# Patient Record
Sex: Male | Born: 1980 | Race: White | Hispanic: No | Marital: Married | Smoking: Current every day smoker
Health system: Southern US, Community
[De-identification: ages and names within clinical notes are randomized; demographics above are authoritative.]

## PROBLEM LIST (undated history)

## (undated) DIAGNOSIS — M5136 Other intervertebral disc degeneration, lumbar region: Secondary | ICD-10-CM

## (undated) DIAGNOSIS — N200 Calculus of kidney: Secondary | ICD-10-CM

## (undated) DIAGNOSIS — M51369 Other intervertebral disc degeneration, lumbar region without mention of lumbar back pain or lower extremity pain: Secondary | ICD-10-CM

## (undated) DIAGNOSIS — J45909 Unspecified asthma, uncomplicated: Secondary | ICD-10-CM

## (undated) DIAGNOSIS — M549 Dorsalgia, unspecified: Secondary | ICD-10-CM

## (undated) DIAGNOSIS — I82409 Acute embolism and thrombosis of unspecified deep veins of unspecified lower extremity: Secondary | ICD-10-CM

## (undated) DIAGNOSIS — F419 Anxiety disorder, unspecified: Secondary | ICD-10-CM

## (undated) DIAGNOSIS — G4733 Obstructive sleep apnea (adult) (pediatric): Secondary | ICD-10-CM

## (undated) DIAGNOSIS — K219 Gastro-esophageal reflux disease without esophagitis: Secondary | ICD-10-CM

## (undated) DIAGNOSIS — E7800 Pure hypercholesterolemia, unspecified: Secondary | ICD-10-CM

## (undated) HISTORY — PX: LITHOTRIPSY: SUR834

---

## 2008-04-16 ENCOUNTER — Emergency Department (HOSPITAL_BASED_OUTPATIENT_CLINIC_OR_DEPARTMENT_OTHER): Admission: EM | Admit: 2008-04-16 | Discharge: 2008-04-16 | Payer: Self-pay | Admitting: Emergency Medicine

## 2008-06-07 ENCOUNTER — Emergency Department (HOSPITAL_BASED_OUTPATIENT_CLINIC_OR_DEPARTMENT_OTHER): Admission: EM | Admit: 2008-06-07 | Discharge: 2008-06-07 | Payer: Self-pay | Admitting: Emergency Medicine

## 2008-07-01 ENCOUNTER — Emergency Department (HOSPITAL_BASED_OUTPATIENT_CLINIC_OR_DEPARTMENT_OTHER): Admission: EM | Admit: 2008-07-01 | Discharge: 2008-07-01 | Payer: Self-pay | Admitting: Emergency Medicine

## 2008-07-22 ENCOUNTER — Emergency Department (HOSPITAL_BASED_OUTPATIENT_CLINIC_OR_DEPARTMENT_OTHER): Admission: EM | Admit: 2008-07-22 | Discharge: 2008-07-22 | Payer: Self-pay | Admitting: Emergency Medicine

## 2008-07-25 ENCOUNTER — Emergency Department (HOSPITAL_BASED_OUTPATIENT_CLINIC_OR_DEPARTMENT_OTHER): Admission: EM | Admit: 2008-07-25 | Discharge: 2008-07-25 | Payer: Self-pay | Admitting: Emergency Medicine

## 2009-02-10 ENCOUNTER — Emergency Department (HOSPITAL_BASED_OUTPATIENT_CLINIC_OR_DEPARTMENT_OTHER): Admission: EM | Admit: 2009-02-10 | Discharge: 2009-02-11 | Payer: Self-pay | Admitting: Emergency Medicine

## 2009-02-10 ENCOUNTER — Ambulatory Visit: Payer: Self-pay | Admitting: Diagnostic Radiology

## 2010-01-08 ENCOUNTER — Emergency Department (HOSPITAL_BASED_OUTPATIENT_CLINIC_OR_DEPARTMENT_OTHER)
Admission: EM | Admit: 2010-01-08 | Discharge: 2010-01-08 | Payer: Self-pay | Source: Home / Self Care | Admitting: Emergency Medicine

## 2010-01-12 ENCOUNTER — Emergency Department (HOSPITAL_BASED_OUTPATIENT_CLINIC_OR_DEPARTMENT_OTHER)
Admission: EM | Admit: 2010-01-12 | Discharge: 2010-01-12 | Payer: Self-pay | Source: Home / Self Care | Admitting: Emergency Medicine

## 2010-04-13 LAB — URINALYSIS, ROUTINE W REFLEX MICROSCOPIC
Hgb urine dipstick: NEGATIVE
Protein, ur: NEGATIVE mg/dL
Urobilinogen, UA: 0.2 mg/dL (ref 0.0–1.0)

## 2010-04-13 LAB — DIFFERENTIAL
Basophils Absolute: 0 10*3/uL (ref 0.0–0.1)
Basophils Relative: 0 % (ref 0–1)
Neutro Abs: 11.9 10*3/uL — ABNORMAL HIGH (ref 1.7–7.7)
Neutrophils Relative %: 80 % — ABNORMAL HIGH (ref 43–77)

## 2010-04-13 LAB — COMPREHENSIVE METABOLIC PANEL
Alkaline Phosphatase: 83 U/L (ref 39–117)
BUN: 19 mg/dL (ref 6–23)
Chloride: 108 mEq/L (ref 96–112)
GFR calc non Af Amer: >60 mL/min (ref >60)
Glucose, Bld: 126 mg/dL — ABNORMAL HIGH (ref 70–99)
Potassium: 4 mEq/L (ref 3.5–5.1)
Total Bilirubin: 1.3 mg/dL — ABNORMAL HIGH (ref 0.3–1.2)

## 2010-04-13 LAB — CBC
HCT: 48.3 % (ref 39.0–52.0)
MCV: 83.7 fL (ref 78.0–100.0)
RBC: 5.77 MIL/uL (ref 4.22–5.81)
RDW: 13.6 % (ref 11.5–15.5)
WBC: 14.8 10*3/uL — ABNORMAL HIGH (ref 4.0–10.5)

## 2010-04-13 LAB — LIPASE, BLOOD: Lipase: 39 U/L (ref 23–300)

## 2010-04-18 LAB — POCT CARDIAC MARKERS: Troponin i, poc: <0.05 ng/mL (ref 0.00–0.09)

## 2010-05-10 LAB — URINALYSIS, ROUTINE W REFLEX MICROSCOPIC
Ketones, ur: NEGATIVE mg/dL
Nitrite: NEGATIVE
pH: 6 (ref 5.0–8.0)

## 2010-08-24 ENCOUNTER — Emergency Department (HOSPITAL_BASED_OUTPATIENT_CLINIC_OR_DEPARTMENT_OTHER)
Admission: EM | Admit: 2010-08-24 | Discharge: 2010-08-25 | Disposition: A | Discharge status: Home / Self Care | Payer: Self-pay | Attending: Emergency Medicine | Admitting: Emergency Medicine

## 2010-08-24 ENCOUNTER — Encounter: Payer: Self-pay | Admitting: *Deleted

## 2010-08-24 DIAGNOSIS — M25579 Pain in unspecified ankle and joints of unspecified foot: Secondary | ICD-10-CM | POA: Insufficient documentation

## 2010-08-24 DIAGNOSIS — F17200 Nicotine dependence, unspecified, uncomplicated: Secondary | ICD-10-CM | POA: Insufficient documentation

## 2010-08-24 DIAGNOSIS — B353 Tinea pedis: Secondary | ICD-10-CM | POA: Insufficient documentation

## 2010-08-24 HISTORY — DX: Pure hypercholesterolemia, unspecified: E78.00

## 2010-08-24 NOTE — ED Notes (Signed)
C/o multiple blisters to bottom of bilateral feet since last week. Pt works with [employer], and states his feet sweat a lot in his boots. Pain worsened today.

## 2010-08-25 MED ORDER — CLINDAMYCIN PHOSPHATE 900 MG/50ML IV SOLN
900.0000 mg | Freq: Once | INTRAVENOUS | Status: AC
  Administered 2010-08-25: 900 mg via INTRAVENOUS
  Filled 2010-08-25: qty 50

## 2010-08-25 MED ORDER — CLINDAMYCIN HCL 150 MG PO CAPS: 150.0000 mg | ORAL_CAPSULE | Freq: Four times a day (QID) | ORAL | Status: AC

## 2010-08-25 MED ORDER — CLOTRIMAZOLE 1 % EX CREA: TOPICAL_CREAM | CUTANEOUS | Status: DC

## 2010-08-25 NOTE — ED Provider Notes (Signed)
History   feet red with blisters after working with sweaty wet feet, does landscaping and yard work, no F/C or N/V, no h/o same.   Chief Complaint  Patient presents with  . Foot Pain    from blisters on bottom of bilateral feet   Patient is a 30 y.o. male presenting with lower extremity pain. The history is provided by the patient.  Foot Pain This is a new problem. The current episode started 2 days ago. The problem occurs constantly. The problem has been gradually worsening. Pertinent negatives include no chest pain, no abdominal pain, no headaches and no shortness of breath. The symptoms are aggravated by walking. The symptoms are relieved by nothing. He has tried nothing for the symptoms. The treatment provided no relief.    Past Medical History  Diagnosis Date  . High cholesterol     History reviewed. No pertinent past surgical history.  History reviewed. No pertinent family history.  History  Substance Use Topics  . Smoking status: Current Everyday Smoker  . Smokeless tobacco: Not on file  . Alcohol Use: Yes     occasionally      Review of Systems  Constitutional: Negative for fever and chills.  HENT: Negative for neck pain and neck stiffness.   Eyes: Negative for pain.  Respiratory: Negative for shortness of breath.   Cardiovascular: Negative for chest pain.  Gastrointestinal: Negative for abdominal pain.  Genitourinary: Negative for dysuria.  Musculoskeletal: Negative for back pain.  Skin: Positive for rash and wound.  Neurological: Negative for headaches.  All other systems reviewed and are negative.    Physical Exam  BP 119/58  Pulse 77  Temp(Src) 98.3 F (36.8 C) (Oral)  Resp 18  Ht 6' (1.829 m)  Wt 260 lb (117.935 kg)  BMI 35.26 kg/m2  SpO2 100%  Physical Exam  Constitutional: He is oriented to person, place, and time. He appears well-developed and well-nourished.  HENT:  Head: Normocephalic and atraumatic.  Eyes: Conjunctivae and EOM are  normal. Pupils are equal, round, and reactive to light.  Neck: Trachea normal and full passive range of motion without pain. Neck supple. No thyromegaly present.       No meningismus  Cardiovascular: Normal rate, regular rhythm, S1 normal, S2 normal, intact distal pulses and normal pulses.     No systolic murmur is present   No diastolic murmur is present  Pulses:      Radial pulses are 2+ on the right side, and 2+ on the left side.  Pulmonary/Chest: Effort normal and breath sounds normal. He has no wheezes. He has no rhonchi. He has no rales. He exhibits no tenderness.  Abdominal: Soft. Normal appearance and bowel sounds are normal. There is no tenderness. There is no CVA tenderness and negative Murphy's sign.  Musculoskeletal: Normal range of motion.       BLE:s both feet with areas of superficial blisters and plantar erythema, no streaking, no swelling, no bony deformity.  calves nontender, no cords. Distal N/V intact. No petechiae.   Neurological: He is alert and oriented to person, place, and time. He has normal strength and normal reflexes. No cranial nerve deficit or sensory deficit. He displays a negative Romberg sign. GCS eye subscore is 4. GCS verbal subscore is 5. GCS motor subscore is 6.       Normal Gait  Skin: Skin is warm and dry. No rash noted. He is not diaphoretic. No cyanosis. Nails show no clubbing.  Psychiatric: He has a  normal mood and affect. His speech is normal and behavior is normal.       Cooperative and appropriate    ED Course  Procedures  MDM Exam c/w bilateral tinea pedis, given erythema and concern for possible 2/2 bacterial infection given ABx and Rx and strict return precautions. Pt states understanding all discharge and follow up instructions.       Sunnie Nielsen, MD 08/25/10 [phone]

## 2010-10-29 ENCOUNTER — Encounter (HOSPITAL_BASED_OUTPATIENT_CLINIC_OR_DEPARTMENT_OTHER): Payer: Self-pay | Admitting: *Deleted

## 2010-10-29 ENCOUNTER — Emergency Department (HOSPITAL_BASED_OUTPATIENT_CLINIC_OR_DEPARTMENT_OTHER)
Admission: EM | Admit: 2010-10-29 | Discharge: 2010-10-29 | Disposition: A | Discharge status: Home / Self Care | Payer: Self-pay | Attending: Emergency Medicine | Admitting: Emergency Medicine

## 2010-10-29 DIAGNOSIS — F17200 Nicotine dependence, unspecified, uncomplicated: Secondary | ICD-10-CM | POA: Insufficient documentation

## 2010-10-29 DIAGNOSIS — R1110 Vomiting, unspecified: Secondary | ICD-10-CM | POA: Insufficient documentation

## 2010-10-29 DIAGNOSIS — E7800 Pure hypercholesterolemia, unspecified: Secondary | ICD-10-CM | POA: Insufficient documentation

## 2010-10-29 LAB — BASIC METABOLIC PANEL
Calcium: 10.3 mg/dL (ref 8.4–10.5)
Chloride: 97 mEq/L (ref 96–112)
Creatinine, Ser: 1.1 mg/dL (ref 0.50–1.35)
GFR calc Af Amer: >60 mL/min (ref >60)
GFR calc non Af Amer: >60 mL/min (ref >60)

## 2010-10-29 MED ORDER — ONDANSETRON HCL 4 MG/2ML IJ SOLN
4.0000 mg | Freq: Once | INTRAMUSCULAR | Status: AC
  Administered 2010-10-29: 4 mg via INTRAVENOUS
  Filled 2010-10-29: qty 2

## 2010-10-29 MED ORDER — ONDANSETRON 4 MG PO TBDP: 4.0000 mg | ORAL_TABLET | Freq: Three times a day (TID) | ORAL | Status: AC | PRN

## 2010-10-29 MED ORDER — SODIUM CHLORIDE 0.9 % IV BOLUS (SEPSIS)
1000.0000 mL | Freq: Once | INTRAVENOUS | Status: AC
  Administered 2010-10-29: 1000 mL via INTRAVENOUS

## 2010-10-29 NOTE — ED Provider Notes (Signed)
History     CSN: [id number] Arrival date & time: 10/29/2010 12:01 PM  Chief Complaint  Patient presents with  . Emesis    (Consider location/radiation/quality/duration/timing/severity/associated sxs/prior treatment) HPI Comments: Pt states that he is having generalized abdominal cramping:pt states that he ate some bad food  Patient is a 30 y.o. male presenting with vomiting. The history is provided by the patient. No language interpreter was used.  Emesis  This is a new problem. The current episode started 6 to 12 hours ago. The problem occurs 5 to 10 times per day. The problem has not changed since onset.The emesis has an appearance of stomach contents. There has been no fever. Associated symptoms include abdominal pain. Pertinent negatives include no diarrhea. Risk factors include suspect food intake.    Past Medical History  Diagnosis Date  . High cholesterol     History reviewed. No pertinent past surgical history.  No family history on file.  History  Substance Use Topics  . Smoking status: Current Everyday Smoker  . Smokeless tobacco: Not on file  . Alcohol Use: Yes     occasionally      Review of Systems  Gastrointestinal: Positive for vomiting and abdominal pain. Negative for diarrhea.  All other systems reviewed and are negative.    Allergies  Review of patient's allergies indicates no known allergies.  Home Medications   Current Outpatient Rx  Name Route Sig Dispense Refill  . CLOTRIMAZOLE 1 % EX CREA  Apply to affected area 2 times daily 15 g 0    BP 120/70  Pulse 58  Temp(Src) 97.5 F (36.4 C) (Oral)  Resp 20  SpO2 100%  Physical Exam  Nursing note and vitals reviewed. Constitutional: He appears well-developed and well-nourished.  HENT:  Head: Normocephalic and atraumatic.  Eyes: Pupils are equal, round, and reactive to light.  Neck: Normal range of motion. Neck supple.  Cardiovascular: Normal rate and regular rhythm.   Pulmonary/Chest:  Effort normal and breath sounds normal.  Abdominal: Soft.  Musculoskeletal: Normal range of motion.  Neurological: He is alert.  Skin: Skin is warm and dry.  Psychiatric: He has a normal mood and affect.    ED Course  Procedures (including critical care time)  Results for orders placed during the hospital encounter of 10/29/10  BASIC METABOLIC PANEL      Component Value Range   Sodium 137  135 - 145 (mEq/L)   Potassium 3.9  3.5 - 5.1 (mEq/L)   Chloride 97  96 - 112 (mEq/L)   CO2 28  19 - 32 (mEq/L)   Glucose, Bld 94  70 - 99 (mg/dL)   BUN 20  6 - 23 (mg/dL)   Creatinine, Ser 7.82  0.50 - 1.35 (mg/dL)   Calcium 95.6  8.4 - 10.5 (mg/dL)   GFR calc non Af Amer >60  >60 (mL/min)   GFR calc Af Amer >60  >60 (mL/min)   No results found.  No results found.   No diagnosis found.    MDM  Pt is tolerating po without any problem:pt is okay to follow up         Teressa Lower, NP 10/29/10 1315

## 2010-10-29 NOTE — ED Provider Notes (Signed)
Medical screening examination/treatment/procedure(s) were performed by non-physician practitioner and as supervising physician I was immediately available for consultation/collaboration.   Joya Gaskins, MD 10/29/10 1420

## 2010-10-29 NOTE — ED Notes (Signed)
Vomiting since 3am. Nausea. No diarrhea. Dizziness. Denies pain.

## 2010-10-29 NOTE — ED Notes (Signed)
Patient is resting comfortably. Warm blanket given and lights dimmed.

## 2010-12-15 ENCOUNTER — Emergency Department (HOSPITAL_BASED_OUTPATIENT_CLINIC_OR_DEPARTMENT_OTHER)
Admission: EM | Admit: 2010-12-15 | Discharge: 2010-12-15 | Disposition: A | Discharge status: Home / Self Care | Payer: Self-pay | Attending: Emergency Medicine | Admitting: Emergency Medicine

## 2010-12-15 ENCOUNTER — Emergency Department (INDEPENDENT_AMBULATORY_CARE_PROVIDER_SITE_OTHER): Payer: Self-pay

## 2010-12-15 ENCOUNTER — Encounter (HOSPITAL_BASED_OUTPATIENT_CLINIC_OR_DEPARTMENT_OTHER): Payer: Self-pay | Admitting: *Deleted

## 2010-12-15 DIAGNOSIS — N201 Calculus of ureter: Secondary | ICD-10-CM | POA: Insufficient documentation

## 2010-12-15 DIAGNOSIS — R109 Unspecified abdominal pain: Secondary | ICD-10-CM

## 2010-12-15 DIAGNOSIS — E7800 Pure hypercholesterolemia, unspecified: Secondary | ICD-10-CM | POA: Insufficient documentation

## 2010-12-15 DIAGNOSIS — Z79899 Other long term (current) drug therapy: Secondary | ICD-10-CM | POA: Insufficient documentation

## 2010-12-15 DIAGNOSIS — F17200 Nicotine dependence, unspecified, uncomplicated: Secondary | ICD-10-CM | POA: Insufficient documentation

## 2010-12-15 DIAGNOSIS — M549 Dorsalgia, unspecified: Secondary | ICD-10-CM | POA: Insufficient documentation

## 2010-12-15 DIAGNOSIS — N1330 Unspecified hydronephrosis: Secondary | ICD-10-CM

## 2010-12-15 LAB — URINALYSIS, ROUTINE W REFLEX MICROSCOPIC
Ketones, ur: NEGATIVE mg/dL
Leukocytes, UA: NEGATIVE
Nitrite: NEGATIVE
Protein, ur: NEGATIVE mg/dL
Urobilinogen, UA: 1 mg/dL (ref 0.0–1.0)
pH: 6 (ref 5.0–8.0)

## 2010-12-15 MED ORDER — ONDANSETRON HCL 4 MG/2ML IJ SOLN
4.0000 mg | Freq: Once | INTRAMUSCULAR | Status: AC
  Administered 2010-12-15: 4 mg via INTRAVENOUS
  Filled 2010-12-15: qty 2

## 2010-12-15 MED ORDER — KETOROLAC TROMETHAMINE 30 MG/ML IJ SOLN
30.0000 mg | Freq: Once | INTRAMUSCULAR | Status: AC
  Administered 2010-12-15: 30 mg via INTRAVENOUS
  Filled 2010-12-15: qty 1

## 2010-12-15 MED ORDER — HYDROMORPHONE HCL PF 2 MG/ML IJ SOLN
2.0000 mg | Freq: Once | INTRAMUSCULAR | Status: AC
  Administered 2010-12-15: 2 mg via INTRAVENOUS
  Filled 2010-12-15: qty 1

## 2010-12-15 MED ORDER — SODIUM CHLORIDE 0.9 % IV BOLUS (SEPSIS)
1000.0000 mL | Freq: Once | INTRAVENOUS | Status: AC
  Administered 2010-12-15: 1000 mL via INTRAVENOUS

## 2010-12-15 MED ORDER — OXYCODONE-ACETAMINOPHEN 5-325 MG PO TABS: 1.0000 | ORAL_TABLET | ORAL | Status: AC | PRN

## 2010-12-15 MED ORDER — ONDANSETRON HCL 4 MG PO TABS: 8.0000 mg | ORAL_TABLET | Freq: Three times a day (TID) | ORAL | Status: DC | PRN

## 2010-12-15 MED ORDER — TAMSULOSIN HCL 0.4 MG PO CAPS: 0.4000 mg | ORAL_CAPSULE | Freq: Every day | ORAL | Status: DC

## 2010-12-15 MED ORDER — OXYCODONE-ACETAMINOPHEN 5-325 MG PO TABS
2.0000 | ORAL_TABLET | Freq: Once | ORAL | Status: AC
  Administered 2010-12-15: 2 via ORAL
  Filled 2010-12-15: qty 2

## 2010-12-15 MED ORDER — IBUPROFEN 600 MG PO TABS: 600.0000 mg | ORAL_TABLET | Freq: Three times a day (TID) | ORAL | Status: DC | PRN

## 2010-12-15 NOTE — ED Notes (Signed)
Pt presents to ED today with left sided back pain radiating around flank and down towards groin.  Pt reports "just laying in bed when pain started"

## 2010-12-15 NOTE — ED Notes (Signed)
Pt wife answering a lot of the questions directed toward pt and supplying all "appropriate" answers

## 2010-12-15 NOTE — ED Provider Notes (Signed)
History     CSN: [id number] Arrival date & time: 12/15/2010 12:24 AM   First MD Initiated Contact with Patient 12/15/10 0021      Chief Complaint  Patient presents with  . Back Pain  . Flank Pain    (Consider location/radiation/quality/duration/timing/severity/associated sxs/prior treatment) HPI Patient reports development of acute onset left-sided flank pain radiating towards his left groin.  Reports nausea and vomiting on arrival to the emergency department.  His had one prior kidney stone he reports he passed that without any difficulty and never really had pain with that.  Denies fever denies chills.  She denies urinary frequency and dysuria.  Denies diarrhea.  He reports no true testicular pain itself chest pain from his abdomen radiating towards his left testicle.  Nothing worsens the symptoms.  Nothing improves symptoms.  His pain is severe.  He reports he cannot get comfortable  Past Medical History  Diagnosis Date  . High cholesterol     History reviewed. No pertinent past surgical history.  History reviewed. No pertinent family history.  History  Substance Use Topics  . Smoking status: Current Everyday Smoker  . Smokeless tobacco: Not on file  . Alcohol Use: Yes     occasionally      Review of Systems  All other systems reviewed and are negative.    Allergies  Review of patient's allergies indicates no known allergies.  Home Medications   Current Outpatient Rx  Name Route Sig Dispense Refill  . CLOTRIMAZOLE 1 % EX CREA  Apply to affected area 2 times daily 15 g 0  . IBUPROFEN 600 MG PO TABS Oral Take 1 tablet (600 mg total) by mouth every 8 (eight) hours as needed for pain. 15 tablet 0  . ONDANSETRON HCL 4 MG PO TABS Oral Take 2 tablets (8 mg total) by mouth every 8 (eight) hours as needed for nausea. 12 tablet 0  . OXYCODONE-ACETAMINOPHEN 5-325 MG PO TABS Oral Take 1 tablet by mouth every 4 (four) hours as needed for pain. 35 tablet 0  . TAMSULOSIN HCL  0.4 MG PO CAPS Oral Take 1 capsule (0.4 mg total) by mouth daily. 10 capsule 0    BP 160/73  Pulse 87  Temp(Src) 97.9 F (36.6 C) (Oral)  Resp 20  Ht 5\' 6"  (1.676 m)  Wt 235 lb (106.595 kg)  BMI 37.93 kg/m2  SpO2 98%  Physical Exam  Nursing note and vitals reviewed. Constitutional: He is oriented to person, place, and time. He appears well-developed and well-nourished.  HENT:  Head: Normocephalic and atraumatic.  Eyes: EOM are normal.  Neck: Normal range of motion.  Cardiovascular: Normal rate, regular rhythm, normal heart sounds and intact distal pulses.   Pulmonary/Chest: Effort normal and breath sounds normal. No respiratory distress.  Abdominal: Soft. He exhibits no distension. There is no tenderness.  Genitourinary: Penis normal.       Normal lie of his bilateral testes without focal tenderness or abnormalities  Musculoskeletal: Normal range of motion.  Neurological: He is alert and oriented to person, place, and time.  Skin: Skin is warm and dry.  Psychiatric: He has a normal mood and affect. Judgment normal.    ED Course  Procedures (including critical care time)  Labs Reviewed  URINALYSIS, ROUTINE W REFLEX MICROSCOPIC - Abnormal; Notable for the following:    Hgb urine dipstick LARGE (*)    All other components within normal limits  URINE MICROSCOPIC-ADD ON - Abnormal; Notable for the following:  Bacteria, UA FEW (*)    All other components within normal limits   Ct Abdomen Pelvis Wo Contrast  12/15/2010  *RADIOLOGY REPORT*  Clinical Data: Left flank pain; history of renal stone.  CT ABDOMEN AND PELVIS WITHOUT CONTRAST  Technique:  Multidetector CT imaging of the abdomen and pelvis was performed following the standard protocol without intravenous contrast.  Comparison: CT of the abdomen and pelvis performed 01/08/2010  Findings: Minimal bibasilar atelectasis is noted.  Some of this is slightly nodular in appearance; given the patient's age, malignancy is highly  unlikely.  The liver and spleen are unremarkable in appearance.  The gallbladder is within normal limits.  The pancreas and adrenal glands are unremarkable.  There is very mild left-sided hydronephrosis, with mild left-sided perinephric stranding, and prominence of the proximal left ureter to the level of an obstructing 6 x 4 mm stone in the proximal left ureter, 4 cm below the left renal pelvis.  The right kidney is unremarkable in appearance.  No nonobstructing renal stones are seen.  No free fluid is identified.  The small bowel is unremarkable in appearance.  The stomach is within normal limits.  No acute vascular abnormalities are seen.  The appendix is normal in caliber, without evidence for appendicitis.  The colon is partially filled with stool and is unremarkable in appearance.  The bladder is decompressed and not well assessed.  The prostate remains normal in size.  No inguinal lymphadenopathy is seen.  No acute osseous abnormalities are identified.  IMPRESSION: Very mild left-sided hydronephrosis, with mild left-sided perinephric stranding, and an obstructing 6 x 4 mm stone in the proximal left ureter, 4 cm below the left renal pelvis.  Original Report Authenticated By: Tonia Ghent, M.D.   Personally reviewed the patient's CT scan   1. Ureteral stone       MDM  Patient symptoms consistent with ureteral colic and a CT scan confirms a 6 x 4 mm left mid ureteral stone.  His pain is controlled after Dilaudid and Toradol emergency department he feels much better at this time would like to go home.  The patient was given 2 Percocets to take prior to discharge so that as his pain medicine wears off he has something in his system until he can fill his prescriptions in the morning.  His no signs of urinary tract infection at this time.  Patient's been given strict return instructions for worsening symptoms has been asked to present to the [facility] [facility] emergency Department if these occur.  He's also  been given urology phone numbers for close followup.  He understands to return the ER for development of worsening abdominal pain severe nausea and vomiting or fevers.        Lyanne Co, MD 12/15/10 [phone]

## 2010-12-16 ENCOUNTER — Emergency Department: Payer: Self-pay | Admitting: Emergency Medicine

## 2010-12-17 ENCOUNTER — Emergency Department (HOSPITAL_BASED_OUTPATIENT_CLINIC_OR_DEPARTMENT_OTHER)
Admission: EM | Admit: 2010-12-17 | Discharge: 2010-12-17 | Disposition: A | Discharge status: Home / Self Care | Payer: Self-pay | Attending: Emergency Medicine | Admitting: Emergency Medicine

## 2010-12-17 ENCOUNTER — Emergency Department (INDEPENDENT_AMBULATORY_CARE_PROVIDER_SITE_OTHER): Payer: Self-pay

## 2010-12-17 ENCOUNTER — Ambulatory Visit (HOSPITAL_COMMUNITY)
Admission: EM | Admit: 2010-12-17 | Discharge: 2010-12-18 | Disposition: A | Discharge status: Home / Self Care | Payer: Self-pay | Attending: Urology | Admitting: Urology

## 2010-12-17 ENCOUNTER — Encounter (HOSPITAL_COMMUNITY): Payer: Self-pay | Admitting: Emergency Medicine

## 2010-12-17 ENCOUNTER — Encounter (HOSPITAL_BASED_OUTPATIENT_CLINIC_OR_DEPARTMENT_OTHER): Payer: Self-pay | Admitting: *Deleted

## 2010-12-17 DIAGNOSIS — F17200 Nicotine dependence, unspecified, uncomplicated: Secondary | ICD-10-CM | POA: Insufficient documentation

## 2010-12-17 DIAGNOSIS — E7800 Pure hypercholesterolemia, unspecified: Secondary | ICD-10-CM | POA: Insufficient documentation

## 2010-12-17 DIAGNOSIS — N200 Calculus of kidney: Secondary | ICD-10-CM

## 2010-12-17 DIAGNOSIS — R109 Unspecified abdominal pain: Secondary | ICD-10-CM | POA: Insufficient documentation

## 2010-12-17 DIAGNOSIS — N201 Calculus of ureter: Secondary | ICD-10-CM | POA: Insufficient documentation

## 2010-12-17 DIAGNOSIS — E860 Dehydration: Secondary | ICD-10-CM

## 2010-12-17 DIAGNOSIS — R112 Nausea with vomiting, unspecified: Secondary | ICD-10-CM | POA: Insufficient documentation

## 2010-12-17 DIAGNOSIS — N23 Unspecified renal colic: Secondary | ICD-10-CM | POA: Insufficient documentation

## 2010-12-17 DIAGNOSIS — R52 Pain, unspecified: Secondary | ICD-10-CM

## 2010-12-17 DIAGNOSIS — N1330 Unspecified hydronephrosis: Secondary | ICD-10-CM | POA: Insufficient documentation

## 2010-12-17 LAB — URINALYSIS, ROUTINE W REFLEX MICROSCOPIC
Glucose, UA: NEGATIVE mg/dL
Leukocytes, UA: NEGATIVE
Protein, ur: 100 mg/dL — AB
Specific Gravity, Urine: 1.018 (ref 1.005–1.030)

## 2010-12-17 LAB — BASIC METABOLIC PANEL
CO2: 28 mEq/L (ref 19–32)
Calcium: 9.1 mg/dL (ref 8.4–10.5)
GFR calc non Af Amer: 66 mL/min — ABNORMAL LOW (ref >90)
Sodium: 144 mEq/L (ref 135–145)

## 2010-12-17 LAB — URINE MICROSCOPIC-ADD ON

## 2010-12-17 LAB — DIFFERENTIAL
Lymphocytes Relative: 28 % (ref 12–46)
Lymphs Abs: 2.5 10*3/uL (ref 0.7–4.0)
Neutrophils Relative %: 61 % (ref 43–77)

## 2010-12-17 LAB — CBC
MCV: 88.3 fL (ref 78.0–100.0)
Platelets: 179 10*3/uL (ref 150–400)
RBC: 4.72 MIL/uL (ref 4.22–5.81)
WBC: 8.9 10*3/uL (ref 4.0–10.5)

## 2010-12-17 MED ORDER — SODIUM CHLORIDE 0.9 % IV BOLUS (SEPSIS)
1000.0000 mL | Freq: Once | INTRAVENOUS | Status: AC
  Administered 2010-12-17: 1000 mL via INTRAVENOUS

## 2010-12-17 MED ORDER — ONDANSETRON HCL 4 MG/2ML IJ SOLN
8.0000 mg | Freq: Once | INTRAMUSCULAR | Status: AC
  Administered 2010-12-17: 8 mg via INTRAVENOUS
  Filled 2010-12-17 (×2): qty 2

## 2010-12-17 MED ORDER — MORPHINE SULFATE 4 MG/ML IJ SOLN
4.0000 mg | Freq: Once | INTRAMUSCULAR | Status: AC
  Administered 2010-12-17: 4 mg via INTRAVENOUS
  Filled 2010-12-17: qty 1

## 2010-12-17 MED ORDER — PROMETHAZINE HCL 25 MG PO TABS: 25.0000 mg | ORAL_TABLET | Freq: Four times a day (QID) | ORAL | Status: AC | PRN

## 2010-12-17 NOTE — ED Notes (Signed)
Pt states he went to the hospital on Tuesday and was told he had kidney stones on the left side  Pt states he went to the hospital yesterday and again this morning with vomiting  Pt states tonight he is nauseated, vomiting, and pain on the right side  Pain on the right started on the right side tonight

## 2010-12-17 NOTE — ED Provider Notes (Signed)
History     CSN: [id number] Arrival date & time: 12/17/2010  8:29 AM   First MD Initiated Contact with Patient 12/17/10 [phone]      Chief Complaint  Patient presents with  . Flank Pain  . Nephrolithiasis  . Nausea    (Consider location/radiation/quality/duration/timing/severity/associated sxs/prior treatment) HPI Comments: Patient continues to have intractable nausea and vomiting. Unable to tolerate medications. Flank pain has improved although continues to vomit.  Patient is a 30 y.o. male presenting with flank pain. The history is provided by the patient. No language interpreter was used.  Flank Pain This is a new problem. The current episode started more than 2 days ago. The problem occurs constantly. The problem has not changed since onset.Associated symptoms include abdominal pain. Pertinent negatives include no chest pain, no headaches and no shortness of breath. The symptoms are aggravated by nothing. The symptoms are relieved by NSAIDs and narcotics.    Past Medical History  Diagnosis Date  . High cholesterol     History reviewed. No pertinent past surgical history.  History reviewed. No pertinent family history.  History  Substance Use Topics  . Smoking status: Current Everyday Smoker  . Smokeless tobacco: Not on file  . Alcohol Use: Yes     occasionally      Review of Systems  Constitutional: Negative for fever, activity change, appetite change and fatigue.  HENT: Negative for congestion, sore throat, neck pain and neck stiffness.   Respiratory: Negative for cough and shortness of breath.   Cardiovascular: Negative for chest pain and palpitations.  Gastrointestinal: Positive for nausea, vomiting and abdominal pain. Negative for diarrhea and constipation.  Genitourinary: Positive for flank pain. Negative for dysuria, urgency and frequency.  Musculoskeletal: Positive for back pain.  Neurological: Negative for dizziness, weakness, numbness and headaches.  All  other systems reviewed and are negative.    Allergies  Review of patient's allergies indicates no known allergies.  Home Medications   Current Outpatient Rx  Name Route Sig Dispense Refill  . CLOTRIMAZOLE 1 % EX CREA  Apply to affected area 2 times daily 15 g 0  . IBUPROFEN 600 MG PO TABS Oral Take 1 tablet (600 mg total) by mouth every 8 (eight) hours as needed for pain. 15 tablet 0  . ONDANSETRON HCL 4 MG PO TABS Oral Take 2 tablets (8 mg total) by mouth every 8 (eight) hours as needed for nausea. 12 tablet 0  . OXYCODONE-ACETAMINOPHEN 5-325 MG PO TABS Oral Take 1 tablet by mouth every 4 (four) hours as needed for pain. 35 tablet 0  . PROMETHAZINE HCL 25 MG PO TABS Oral Take 1 tablet (25 mg total) by mouth every 6 (six) hours as needed for nausea. 20 tablet 0  . TAMSULOSIN HCL 0.4 MG PO CAPS Oral Take 1 capsule (0.4 mg total) by mouth daily. 10 capsule 0    BP 144/84  Pulse 80  Resp 16  Ht 6' (1.829 m)  Wt 235 lb (106.595 kg)  BMI 31.87 kg/m2  SpO2 97%  Physical Exam  Nursing note and vitals reviewed. Constitutional: He is oriented to person, place, and time. He appears well-developed and well-nourished.  HENT:  Head: Normocephalic and atraumatic.  Mouth/Throat: Oropharynx is clear and moist.  Eyes: Conjunctivae and EOM are normal. Pupils are equal, round, and reactive to light.  Neck: Normal range of motion. Neck supple.  Cardiovascular: Normal rate, regular rhythm, normal heart sounds and intact distal pulses.   Pulmonary/Chest: Effort normal and breath  sounds normal. No respiratory distress.  Abdominal: Soft. Bowel sounds are normal. He exhibits no distension. There is no tenderness. There is no rebound and no guarding.       No cva tenderness bilaterally.  No abd pain on palpation  Musculoskeletal: Normal range of motion. He exhibits no tenderness.  Neurological: He is alert and oriented to person, place, and time.  Skin: Skin is warm and dry.    ED Course    Procedures (including critical care time)  Labs Reviewed  URINALYSIS, ROUTINE W REFLEX MICROSCOPIC - Abnormal; Notable for the following:    Hgb urine dipstick MODERATE (*)    Protein, ur 100 (*)    All other components within normal limits  BASIC METABOLIC PANEL - Abnormal; Notable for the following:    Glucose, Bld 102 (*)    Creatinine, Ser 1.40 (*)    GFR calc non Af Amer 66 (*)    GFR calc Af Amer 77 (*)    All other components within normal limits  URINE MICROSCOPIC-ADD ON - Abnormal; Notable for the following:    Squamous Epithelial / LPF FEW (*)    Bacteria, UA FEW (*)    All other components within normal limits  CBC  DIFFERENTIAL   US Renal  12/17/2010  *RADIOLOGY REPORT*  Clinical Data: Nausea and vomiting.  The left ureteral stone.  RENAL/URINARY TRACT ULTRASOUND COMPLETE  Comparison:  CT abdomen and pelvis 12/15/2010.  Findings:  Right Kidney:  Measures 12.7 cm and appears normal without stone, mass or hydronephrosis.  Left Kidney:  Measures 12.4 cm.  There is minimal fullness of the renal collecting system.  No stone or mass identified.  Bladder:  Incompletely distended but otherwise unremarkable.  IMPRESSION: Minimal fullness of the left renal collecting system.  The examination is otherwise negative.  Original Report Authenticated By: Bernadene Bell. D'ALESSIO, M.D.     1. Renal colic   2. Nausea and vomiting       MDM  Given the absence of hydronephrosis I feel the patient's slight elevation in creatinine secondary to dehydration. He received a liter of fluids as well as antiemetics with improvement and resolution of his symptoms. Patient tolerated oral intake. I explained the importance of followup with the urologist given the size of his stone. I instructed him to return if he has worsening of his symptoms.        Dayton Bailiff, MD 12/17/10 1052

## 2010-12-17 NOTE — ED Notes (Signed)
Pt amb to room 5 with quick steady gait in nad. Pt is smiling, states he was seen here on 11/14, dx with kidney stones, cont with pain and vomiting. Pt denies any pain, states "I just can't stop throwing up.Marland KitchenMarland Kitchen"

## 2010-12-17 NOTE — ED Notes (Signed)
Pt had no IV in LAC upon arrival.

## 2010-12-18 ENCOUNTER — Encounter (HOSPITAL_COMMUNITY): Admission: EM | Disposition: A | Discharge status: Home / Self Care | Payer: Self-pay | Source: Home / Self Care

## 2010-12-18 ENCOUNTER — Emergency Department (HOSPITAL_COMMUNITY): Payer: Self-pay

## 2010-12-18 ENCOUNTER — Encounter (HOSPITAL_COMMUNITY): Payer: Self-pay | Admitting: Anesthesiology

## 2010-12-18 ENCOUNTER — Emergency Department (HOSPITAL_COMMUNITY): Payer: Self-pay | Admitting: Anesthesiology

## 2010-12-18 HISTORY — PX: CYSTOSCOPY W/ URETERAL STENT PLACEMENT: SHX1429

## 2010-12-18 LAB — COMPREHENSIVE METABOLIC PANEL
ALT: 13 U/L (ref 0–53)
AST: 14 U/L (ref 0–37)
CO2: 26 mEq/L (ref 19–32)
Calcium: 8.6 mg/dL (ref 8.4–10.5)
Chloride: 106 mEq/L (ref 96–112)
GFR calc non Af Amer: 55 mL/min — ABNORMAL LOW (ref >90)
Sodium: 140 mEq/L (ref 135–145)

## 2010-12-18 LAB — URINE MICROSCOPIC-ADD ON

## 2010-12-18 LAB — DIFFERENTIAL
Basophils Absolute: 0 10*3/uL (ref 0.0–0.1)
Eosinophils Relative: 1 % (ref 0–5)
Lymphocytes Relative: 19 % (ref 12–46)
Neutro Abs: 7 10*3/uL (ref 1.7–7.7)

## 2010-12-18 LAB — CBC
Platelets: 170 10*3/uL (ref 150–400)
RDW: 14.2 % (ref 11.5–15.5)
WBC: 9.7 10*3/uL (ref 4.0–10.5)

## 2010-12-18 LAB — URINALYSIS, ROUTINE W REFLEX MICROSCOPIC
Glucose, UA: NEGATIVE mg/dL
Protein, ur: 100 mg/dL — AB
pH: 6.5 (ref 5.0–8.0)

## 2010-12-18 SURGERY — CYSTOSCOPY, WITH RETROGRADE PYELOGRAM AND URETERAL STENT INSERTION
Anesthesia: General | Site: Ureter | Laterality: Left | Wound class: Clean Contaminated

## 2010-12-18 MED ORDER — IOHEXOL 300 MG/ML  SOLN
INTRAMUSCULAR | Status: DC | PRN
  Administered 2010-12-18: 50 mL

## 2010-12-18 MED ORDER — HYDROMORPHONE HCL PF 1 MG/ML IJ SOLN
1.0000 mg | Freq: Once | INTRAMUSCULAR | Status: AC
  Administered 2010-12-18: 1 mg via INTRAVENOUS
  Filled 2010-12-18: qty 1

## 2010-12-18 MED ORDER — KETAMINE HCL 10 MG/ML IJ SOLN
INTRAMUSCULAR | Status: DC | PRN
  Administered 2010-12-18: 10 mg via INTRAVENOUS
  Administered 2010-12-18: 5 mg via INTRAVENOUS
  Administered 2010-12-18: 10 mg via INTRAVENOUS

## 2010-12-18 MED ORDER — PROPOFOL 10 MG/ML IV EMUL
INTRAVENOUS | Status: DC | PRN
  Administered 2010-12-18: 300 mg via INTRAVENOUS

## 2010-12-18 MED ORDER — SODIUM CHLORIDE 0.9 % IV BOLUS (SEPSIS)
1000.0000 mL | Freq: Once | INTRAVENOUS | Status: AC
  Administered 2010-12-18: 1000 mL via INTRAVENOUS

## 2010-12-18 MED ORDER — ONDANSETRON HCL 4 MG/2ML IJ SOLN
4.0000 mg | Freq: Once | INTRAMUSCULAR | Status: AC
  Administered 2010-12-18: 4 mg via INTRAVENOUS
  Filled 2010-12-18: qty 2

## 2010-12-18 MED ORDER — HYDROMORPHONE HCL PF 1 MG/ML IJ SOLN
0.2500 mg | INTRAMUSCULAR | Status: DC | PRN
  Administered 2010-12-18 (×2): 0.5 mg via INTRAVENOUS

## 2010-12-18 MED ORDER — PROMETHAZINE HCL 25 MG/ML IJ SOLN
INTRAMUSCULAR | Status: AC
  Administered 2010-12-18: 6.25 mg via INTRAVENOUS
  Filled 2010-12-18: qty 1

## 2010-12-18 MED ORDER — PROMETHAZINE HCL 25 MG/ML IJ SOLN
6.2500 mg | INTRAMUSCULAR | Status: DC | PRN
  Administered 2010-12-18: 6.25 mg via INTRAVENOUS
  Filled 2010-12-18: qty 1

## 2010-12-18 MED ORDER — SODIUM CHLORIDE 0.9 % IV SOLN
Freq: Once | INTRAVENOUS | Status: AC
  Administered 2010-12-18: 1000 mL via INTRAVENOUS

## 2010-12-18 MED ORDER — SODIUM CHLORIDE 0.9 % IR SOLN
Status: DC | PRN
  Administered 2010-12-18: 1000 mL

## 2010-12-18 MED ORDER — FENTANYL CITRATE 0.05 MG/ML IJ SOLN
INTRAMUSCULAR | Status: DC | PRN
  Administered 2010-12-18 (×2): 50 ug via INTRAVENOUS
  Administered 2010-12-18 (×2): 25 ug via INTRAVENOUS

## 2010-12-18 MED ORDER — SUCCINYLCHOLINE CHLORIDE 20 MG/ML IJ SOLN
INTRAMUSCULAR | Status: DC | PRN
  Administered 2010-12-18: 200 mg via INTRAVENOUS

## 2010-12-18 MED ORDER — SODIUM CHLORIDE 0.9 % IR SOLN
Status: DC | PRN
  Administered 2010-12-18: 3000 mL

## 2010-12-18 MED ORDER — DEXTROSE-NACL 5-0.45 % IV SOLN: INTRAVENOUS | Status: DC

## 2010-12-18 MED ORDER — LACTATED RINGERS IV SOLN
INTRAVENOUS | Status: DC | PRN
  Administered 2010-12-18 (×2): via INTRAVENOUS

## 2010-12-18 MED ORDER — ACETAMINOPHEN 10 MG/ML IV SOLN
INTRAVENOUS | Status: AC
  Filled 2010-12-18: qty 100

## 2010-12-18 MED ORDER — ACETAMINOPHEN 10 MG/ML IV SOLN
INTRAVENOUS | Status: DC | PRN
  Administered 2010-12-18: 1000 mg via INTRAVENOUS

## 2010-12-18 MED ORDER — ONDANSETRON HCL 4 MG/2ML IJ SOLN
INTRAMUSCULAR | Status: DC | PRN
  Administered 2010-12-18 (×2): 2 mg via INTRAVENOUS

## 2010-12-18 MED ORDER — LIDOCAINE HCL (CARDIAC) 20 MG/ML IV SOLN
INTRAVENOUS | Status: DC | PRN
  Administered 2010-12-18: 30 mg via INTRAVENOUS

## 2010-12-18 MED ORDER — MIDAZOLAM HCL 5 MG/5ML IJ SOLN
INTRAMUSCULAR | Status: DC | PRN
  Administered 2010-12-18 (×2): 1 mg via INTRAVENOUS

## 2010-12-18 MED ORDER — HYDROMORPHONE HCL PF 1 MG/ML IJ SOLN
INTRAMUSCULAR | Status: AC
  Filled 2010-12-18: qty 1

## 2010-12-18 MED ORDER — CEFAZOLIN SODIUM 1-5 GM-% IV SOLN
1.0000 g | Freq: Once | INTRAVENOUS | Status: AC
  Administered 2010-12-18: 1 g via INTRAVENOUS
  Filled 2010-12-18: qty 50

## 2010-12-18 MED ORDER — IOHEXOL 300 MG/ML  SOLN
INTRAMUSCULAR | Status: AC
  Filled 2010-12-18: qty 1

## 2010-12-18 SURGICAL SUPPLY — 19 items
ADAPTER CATH URET PLST 4-6FR (CATHETERS) ×2 IMPLANT
BAG URO CATCHER STRL LF (DRAPE) ×2 IMPLANT
CATH INTERMIT  6FR 70CM (CATHETERS) ×2 IMPLANT
CATH URET 5FR 28IN CONE TIP (BALLOONS)
CATH URET 5FR 70CM CONE TIP (BALLOONS) IMPLANT
CLOTH BEACON ORANGE TIMEOUT ST (SAFETY) ×2 IMPLANT
DRAPE CAMERA CLOSED 9X96 (DRAPES) ×2 IMPLANT
GLOVE BIOGEL M 7.0 STRL (GLOVE) ×2 IMPLANT
GOWN STRL NON-REIN LRG LVL3 (GOWN DISPOSABLE) ×4 IMPLANT
GUIDEWIRE STR DUAL SENSOR (WIRE) ×2 IMPLANT
LASER FIBER DISP (UROLOGICAL SUPPLIES) ×2 IMPLANT
MANIFOLD NEPTUNE II (INSTRUMENTS) ×2 IMPLANT
MARKER SKIN DUAL TIP RULER LAB (MISCELLANEOUS) ×2 IMPLANT
NS IRRIG 1000ML POUR BTL (IV SOLUTION) ×2 IMPLANT
PACK CYSTO (CUSTOM PROCEDURE TRAY) ×2 IMPLANT
SHEATH URET ACCESS 12FR/35CM (UROLOGICAL SUPPLIES) ×2 IMPLANT
STENT CONTOUR URETERAL (STENTS) ×2 IMPLANT
TUBING CONNECTING 10 (TUBING) ×2 IMPLANT
WIRE COONS/BENSON .038X145CM (WIRE) IMPLANT

## 2010-12-18 NOTE — ED Notes (Signed)
Family at bedside.Patient is resting comfortably.VS with in normal limits

## 2010-12-18 NOTE — ED Notes (Signed)
Patient is resting comfortably.Delay explained, waiting for the urology consult, pt placed NPO, informed, hydration in process

## 2010-12-18 NOTE — Anesthesia Postprocedure Evaluation (Signed)
  Anesthesia Post-op Note  Patient: Frank Robinson  Procedure(s) Performed:  CYSTOSCOPY WITH RETROGRADE PYELOGRAM/URETERAL STENT PLACEMENT - left ureteroscopy; HOLMIUM LASER APPLICATION  Patient Location: PACU  Anesthesia Type: General  Level of Consciousness: awake and oriented  Airway and Oxygen Therapy: Patient Spontanous Breathing  Post-op Pain: mild  Post-op Assessment: Post-op Vital signs reviewed, Patient's Cardiovascular Status Stable, Respiratory Function Stable and Patent Airway  Post-op Vital Signs: stable  Complications: No apparent anesthesia complications

## 2010-12-18 NOTE — ED Notes (Signed)
Family at bedside. 

## 2010-12-18 NOTE — ED Notes (Signed)
MD at bedside.Urology consult arrived.

## 2010-12-18 NOTE — ED Notes (Signed)
Pt reminded of a need for urine sample, urinal at the bedside, pt verbalizes understanding, however at this time not able to produce urine. Hydration in progress. Will follow up.

## 2010-12-18 NOTE — H&P (Signed)
Frank Robinson is an 30 y.o. male.    HPI: Frank Robinson is a 30 years old male who is seen in the ER with a history of sudden onset of severe left flank pain Tuesday night.  [facility].  CT scan showed a 6x4 mm proximal left ureteral calculus with mild hydronephrosis.  He was given analgesics.  The pain subsided and he was discharged home on Flomax.  Since then he has been at least twice  to the ER in [place] and at the [facility] for pain.  The pain recurred again early this morning and was associated with nausea and vomiting.  KUB showed the stone at the transverse process of L4.  He was given analgesics, but still complains of pain.  Past Medical History  Diagnosis Date  . High cholesterol     History reviewed. No pertinent past surgical history.  Medications Prior to Admission  Medication Dose Route Frequency Provider Last Rate Last Dose  . 0.9 %  sodium chloride infusion   Intravenous Once Peter A. Tucich, MD 20 mL/hr at 12/18/10 0340 1,000 mL at 12/18/10 0340  . HYDROmorphone (DILAUDID) injection 1 mg  1 mg Intravenous Once Peter A. Patrica Duel, MD   1 mg at 12/18/10 0131  . HYDROmorphone (DILAUDID) injection 1 mg  1 mg Intravenous Once Peter A. Patrica Duel, MD   1 mg at 12/18/10 0340  . HYDROmorphone (DILAUDID) injection 1 mg  1 mg Intravenous Once Peter A. Patrica Duel, MD   1 mg at 12/18/10 0644  . morphine 4 MG/ML injection 4 mg  4 mg Intravenous Once Dayton Bailiff, MD   4 mg at 12/17/10 0947  . ondansetron (ZOFRAN) injection 4 mg  4 mg Intravenous Once Peter A. Patrica Duel, MD   4 mg at 12/18/10 0131  . ondansetron (ZOFRAN) injection 4 mg  4 mg Intravenous Once Peter A. Patrica Duel, MD      . ondansetron [facility]) injection 8 mg  8 mg Intravenous Once Dayton Bailiff, MD   8 mg at 12/17/10 0947  . sodium chloride 0.9 % bolus 1,000 mL  1,000 mL Intravenous Once Dayton Bailiff, MD   1,000 mL at 12/17/10 0947  . sodium chloride 0.9 % bolus 1,000 mL  1,000 mL Intravenous Once Peter  A. Patrica Duel, MD   1,000 mL at 12/18/10 0132   Medications Prior to Admission  Medication Sig Dispense Refill  . oxyCODONE-acetaminophen (PERCOCET) 5-325 MG per tablet Take 1 tablet by mouth every 4 (four) hours as needed for pain.  35 tablet  0  . Tamsulosin HCl (FLOMAX) 0.4 MG CAPS Take 1 capsule (0.4 mg total) by mouth daily.  10 capsule  0    Allergies: No Known Allergies  History reviewed. No pertinent family history.  Social History.  He has smoked about a pack a day for 14 years.  He drinks occasionally.  He is married, has two children.  His wife is pregnant. Review of Systems: Pertinent items are noted in HPI. A comprehensive review of systems was negative except as noted in the HPI.  Results for orders placed during the hospital encounter of 12/17/10 (from the past 48 hour(s))  CBC     Status: Normal   Collection Time   12/18/10  1:01 AM      Component Value Range Comment   WBC 9.7  4.0 - 10.5 (K/uL)    RBC 4.38  4.22 - 5.81 (MIL/uL)    Hemoglobin 13.1  13.0 - 17.0 (g/dL)    HCT 40.9  81.1 - 91.4 (%)    MCV 90.4  78.0 - 100.0 (fL)    MCH 29.9  26.0 - 34.0 (pg)    MCHC 33.1  30.0 - 36.0 (g/dL)    RDW 78.2  95.6 - 21.3 (%)    Platelets 170  150 - 400 (K/uL)   DIFFERENTIAL     Status: Normal   Collection Time   12/18/10  1:01 AM      Component Value Range Comment   Neutrophils Relative 72  43 - 77 (%)    Neutro Abs 7.0  1.7 - 7.7 (K/uL)    Lymphocytes Relative 19  12 - 46 (%)    Lymphs Abs 1.8  0.7 - 4.0 (K/uL)    Monocytes Relative 7  3 - 12 (%)    Monocytes Absolute 0.7  0.1 - 1.0 (K/uL)    Eosinophils Relative 1  0 - 5 (%)    Eosinophils Absolute 0.1  0.0 - 0.7 (K/uL)    Basophils Relative 0  0 - 1 (%)    Basophils Absolute 0.0  0.0 - 0.1 (K/uL)   COMPREHENSIVE METABOLIC PANEL     Status: Abnormal   Collection Time   12/18/10  1:01 AM      Component Value Range Comment   Sodium 140  135 - 145 (mEq/L)    Potassium 3.7  3.5 - 5.1 (mEq/L)    Chloride 106  96 -  112 (mEq/L)    CO2 26  19 - 32 (mEq/L)    Glucose, Bld 100 (*) 70 - 99 (mg/dL)    BUN 18  6 - 23 (mg/dL)    Creatinine, Ser 0.86 (*) 0.50 - 1.35 (mg/dL)    Calcium 8.6  8.4 - 10.5 (mg/dL)    Total Protein 6.1  6.0 - 8.3 (g/dL)    Albumin 3.4 (*) 3.5 - 5.2 (g/dL)    AST 14  0 - 37 (U/L)    ALT 13  0 - 53 (U/L)    Alkaline Phosphatase 58  39 - 117 (U/L)    Total Bilirubin 0.8  0.3 - 1.2 (mg/dL)    GFR calc non Af Amer 55 (*) >90 (mL/min)    GFR calc Af Amer 64 (*) >90 (mL/min)   LIPASE, BLOOD     Status: Normal   Collection Time   12/18/10  1:01 AM      Component Value Range Comment   Lipase 14  11 - 59 (U/L)   URINALYSIS, ROUTINE W REFLEX MICROSCOPIC     Status: Abnormal   Collection Time   12/18/10  2:14 AM      Component Value Range Comment   Color, Urine YELLOW  YELLOW     Appearance CLOUDY (*) CLEAR     Specific Gravity, Urine 1.018  1.005 - 1.030     pH 6.5  5.0 - 8.0     Glucose, UA NEGATIVE  NEGATIVE (mg/dL)    Hgb urine dipstick LARGE (*) NEGATIVE     Bilirubin Urine NEGATIVE  NEGATIVE     Ketones, ur NEGATIVE  NEGATIVE (mg/dL)    Protein, ur 578 (*) NEGATIVE (mg/dL)    Urobilinogen, UA 0.2  0.0 - 1.0 (mg/dL)    Nitrite NEGATIVE  NEGATIVE     Leukocytes, UA NEGATIVE  NEGATIVE    URINE MICROSCOPIC-ADD ON     Status: Normal   Collection Time   12/18/10  2:14 AM      Component Value Range Comment   WBC, UA 0-2  <3 (WBC/hpf)    RBC / HPF TOO NUMEROUS TO COUNT  <3 (RBC/hpf)    Urine-Other MUCOUS PRESENT       Dg Abd 1 View  12/18/2010  *RADIOLOGY REPORT*  Clinical Data: Left-sided flank pain  ABDOMEN - 1 VIEW  Comparison: 12/15/2010 CT  Findings: 6 mm craniocaudal dimension calcification projecting over the expected course of the mid-left ureter. May have migrated slightly in the interval.  No other calcifications identified. Nonobstructive bowel gas pattern.  Organ outlines normal where seen.  No acute osseous abnormality.  IMPRESSION: 6 mm mid left ureteral stone.   Original Report Authenticated By: Waneta Martins, M.D.   US Renal  12/17/2010  *RADIOLOGY REPORT*  Clinical Data: Nausea and vomiting.  The left ureteral stone.  RENAL/URINARY TRACT ULTRASOUND COMPLETE  Comparison:  CT abdomen and pelvis 12/15/2010.  Findings:  Right Kidney:  Measures 12.7 cm and appears normal without stone, mass or hydronephrosis.  Left Kidney:  Measures 12.4 cm.  There is minimal fullness of the renal collecting system.  No stone or mass identified.  Bladder:  Incompletely distended but otherwise unremarkable.  IMPRESSION: Minimal fullness of the left renal collecting system.  The examination is otherwise negative.  Original Report Authenticated By: Bernadene Bell. Maricela Curet, M.D.    Temp:  [98.2 F (36.8 C)-99.4 F (37.4 C)] 98.6 F (37 C) (11/17 0403) Pulse Rate:  [62-82] 62  (11/17 0403) Resp:  [16-20] 16  (11/17 0403) BP: (120-144)/(66-84) 120/66 mmHg (11/17 0403) SpO2:  [94 %-100 %] 94 % (11/17 0403) Weight:  [106.595 kg (235 lb)] 235 lb (106.595 kg) (11/16 0827)  Physical Exam: General appearance: alert and appears stated age Head: Normocephalic, without obvious abnormality, atraumatic Eyes: conjunctivae/corneas clear. EOM's intact.  Oropharynx: moist mucous membranes Neck: supple, symmetrical, trachea midline Resp: normal respiratory effort Cardio: regular rate and rhythm Back: symmetric, no curvature. ROM normal. No CVA tenderness. GI: soft, non-tender; bowel sounds normal; no masses,  no organomegaly Male genitalia: penis: normal male phallus with no lesions or discharge.Testes: bilaterally descended with no masses or tenderness. no hernias Pelvic: deferred Extremities: extremities normal, atraumatic, no cyanosis or edema Skin: Skin color normal. No visible rashes or lesions Neurologic: Grossly normal  Assessment/Plan I have independently reviewed the CT scan and the KUB.  The findings are as noted above in the HPI.  Left ureteral calculus.  Plan:  Cystoscopy, left retrograde pyelogram, ureteroscopy, holmium laser, JJ stent. The procedure, risks, benefits were explained to the patient and his wife.  The risks include but are not limited to hemorrhage, infection, ureteral injury.  They understand and agree to proceed.  Darolyn Double-HENRY 12/18/2010, 6:54 AM

## 2010-12-18 NOTE — Transfer of Care (Signed)
Immediate Anesthesia Transfer of Care Note  Patient: Frank Robinson  Procedure(s) Performed:  CYSTOSCOPY WITH RETROGRADE PYELOGRAM/URETERAL STENT PLACEMENT - left ureteroscopy; HOLMIUM LASER APPLICATION  Patient Location: PACU  Anesthesia Type: General  Level of Consciousness: awake  Airway & Oxygen Therapy: Patient Spontanous Breathing and Patient connected to face mask  Post-op Assessment: Report given to PACU RN  Post vital signs: Reviewed and stable  Complications: No apparent anesthesia complications

## 2010-12-18 NOTE — Op Note (Signed)
Dr Su Grand  Patient Frank Robinson  Preprocedure diagnosis: left ureteral calculus  Postop diagnosis: same  Procedure: Cystoscopy, left retrograde pyelogram, ureteroscopy, holmium  laser and double-J stent.  Indication: patient is a 76 is a 30 years old male who has been having left flank pain since Tuesday.  He has been to the emergency room 3 or 4 times since. CT scan showed a 6 x 4 mm stone in the proximal left ureter with mild hydronephrosishe returned to the emergency room this morning still complaining of pain. KUB showed the stone in the same location. He is scheduled for cystoscopy and stone manipulation.  He was identified by his wrist band and proper timeout was taken.  Under general anesthesia the patient was prepped and draped and placed in the dorsolithotomy position. A  panendoscope was inserted in the bladder.   The urethra is normal.  The bladder mucosa is normal. There is no stone or tumor in the bladder. The ureteral orifices are in normal position and shape.  Retrograde pyelogram: A cone tipi was passed below the cystoscope into the bladder and will the left ureteral orifice. Contrast was then injected through the cone-tipped catheter. The distal and mid ureter appear  Normal. There is a filling defect in the proximal ureter. The calyces appear moderately dilated. The cone-tipped catheter was removed. A sensor wire was passed through the cystoscope and through  the left ureter up to the renal pelvis  The cystoscope was removed the intramural ureter was dilated with the ureteroscope access sheath.  A short semirigid ureteroscope was then passed in the bladder and in the ureter. The scope was too short to reach the stone.  I replaced the short ureteroscope with a long ureteroscope and was able to visualize the stone in the proximal ureter. With the holmium laser at a setting of 0.5 and 40 shocks the stone was fragmented in multiple small stone fragments.   The fragments  are small enough to pass spontaneously.  Contrast was again injected through the ureteroscope and there was no evidence of extravasation of contrast. The ureteroscope was then removed under direct vision. The sensor wire was backloaded into the cystoscope and a #[id number] French-[id number] double-J stent was passed over the sensor wire. The sensor wire was removed.   The proximal curl of the double-J stent is in the renal pelvis and the distal curl is in the bladder. The bladder was then emptied and the cystoscope removed.  The patient tolerated the procedure well and left the OR in satisfactory condition to post anesthesia care unit.  Since  the stone fragments are small I did not attempt to extract them. The patient should be able to pass the stones on his own after removal of the double-J stent.

## 2010-12-18 NOTE — Anesthesia Preprocedure Evaluation (Signed)
Anesthesia Evaluation  Patient identified by MRN, date of birth, ID band Patient awake    Reviewed: Allergy & Precautions, H&P , NPO status , Patient's Chart, lab work & pertinent test results, reviewed documented beta blocker date and time   Airway Mallampati: II TM Distance: >3 FB Neck ROM: Full    Dental   Pulmonary Current Smoker,  clear to auscultation        Cardiovascular neg cardio ROS Regular Normal Denies cardiac symptoms   Neuro/Psych Negative Neurological ROS  Negative Psych ROS   GI/Hepatic negative GI ROS, Neg liver ROS,   Endo/Other  Negative Endocrine ROS  Renal/GU Kidney stone  Genitourinary negative   Musculoskeletal negative musculoskeletal ROS (+)   Abdominal   Peds negative pediatric ROS (+)  Hematology negative hematology ROS (+)   Anesthesia Other Findings   Reproductive/Obstetrics negative OB ROS                           Anesthesia Physical Anesthesia Plan  ASA: II and Emergent  Anesthesia Plan: General   Post-op Pain Management:    Induction: Intravenous, Rapid sequence and Cricoid pressure planned  Airway Management Planned: Oral ETT  Additional Equipment:   Intra-op Plan:   Post-operative Plan: Extubation in OR  Informed Consent: I have reviewed the patients History and Physical, chart, labs and discussed the procedure including the risks, benefits and alternatives for the proposed anesthesia with the patient or authorized representative who has indicated his/her understanding and acceptance.     Plan Discussed with: CRNA and Surgeon  Anesthesia Plan Comments:         Anesthesia Quick Evaluation

## 2010-12-18 NOTE — ED Provider Notes (Cosign Needed Addendum)
History     CSN: [id number] Arrival date & time: 12/17/2010 10:49 PM   First MD Initiated Contact with Patient 12/18/10 0043      Chief Complaint  Patient presents with  . Flank Pain   Patient returns, to the hospital this evening for continued flank pain. He states he was initially seen, Wednesday, and diagnosed with a kidney stone. He has persistent nausea and vomiting and has been in the hospital several times since. He is taking pain medications and Phenergan at home, but states unable to keep down the Phenergan. He states the pain was initially on the left flank, but is now in the right flank. He has had no fevers. Denies any dysuria. Patient was last seen at 8 AM on November 16 for same and was discharged home after having a CAT scan done. See chart (Consider location/radiation/quality/duration/timing/severity/associated sxs/prior treatment) HPI  Past Medical History  Diagnosis Date  . High cholesterol     History reviewed. No pertinent past surgical history.  History reviewed. No pertinent family history.  History  Substance Use Topics  . Smoking status: Current Everyday Smoker  . Smokeless tobacco: Not on file  . Alcohol Use: Yes     occasionally      Review of Systems  All other systems reviewed and are negative.    Allergies  Review of patient's allergies indicates no known allergies.  Home Medications   Current Outpatient Rx  Name Route Sig Dispense Refill  . OXYCODONE-ACETAMINOPHEN 5-325 MG PO TABS Oral Take 1 tablet by mouth every 4 (four) hours as needed for pain. 35 tablet 0  . PROMETHAZINE HCL 25 MG PO TABS Oral Take 1 tablet (25 mg total) by mouth every 6 (six) hours as needed for nausea. 20 tablet 0  . TAMSULOSIN HCL 0.4 MG PO CAPS Oral Take 1 capsule (0.4 mg total) by mouth daily. 10 capsule 0    BP 132/70  Pulse 78  Temp(Src) 99.3 F (37.4 C) (Oral)  Resp 18  SpO2 98%  Physical Exam  Constitutional: He is oriented to person, place, and  time. He appears well-developed and well-nourished.  HENT:  Head: Normocephalic and atraumatic.  Eyes: Conjunctivae and EOM are normal. Pupils are equal, round, and reactive to light.  Neck: Neck supple.  Cardiovascular: Normal rate and regular rhythm.  Exam reveals no gallop and no friction rub.   No murmur heard. Pulmonary/Chest: Breath sounds normal. He has no wheezes. He has no rales. He exhibits no tenderness.  Abdominal: Soft. Bowel sounds are normal. He exhibits no distension. There is tenderness. There is no rebound and no guarding.       Mild diffuse tenderness   Genitourinary:       + bl flank pain   Musculoskeletal: Normal range of motion.  Neurological: He is alert and oriented to person, place, and time. No cranial nerve deficit. Coordination normal.  Skin: Skin is warm and dry. No rash noted.  Psychiatric: He has a normal mood and affect.    ED Course  Procedures (including critical care time)  Labs Reviewed  COMPREHENSIVE METABOLIC PANEL - Abnormal; Notable for the following:    Glucose, Bld 100 (*)    Creatinine, Ser 1.63 (*)    Albumin 3.4 (*)    GFR calc non Af Amer 55 (*)    GFR calc Af Amer 64 (*)    All other components within normal limits  CBC  DIFFERENTIAL  LIPASE, BLOOD  URINALYSIS, ROUTINE W REFLEX  MICROSCOPIC   Dg Abd 1 View  12/18/2010  *RADIOLOGY REPORT*  Clinical Data: Left-sided flank pain  ABDOMEN - 1 VIEW  Comparison: 12/15/2010 CT  Findings: 6 mm craniocaudal dimension calcification projecting over the expected course of the mid-left ureter. May have migrated slightly in the interval.  No other calcifications identified. Nonobstructive bowel gas pattern.  Organ outlines normal where seen.  No acute osseous abnormality.  IMPRESSION: 6 mm mid left ureteral stone.  Original Report Authenticated By: Waneta Martins, M.D.   US Renal  12/17/2010  *RADIOLOGY REPORT*  Clinical Data: Nausea and vomiting.  The left ureteral stone.  RENAL/URINARY  TRACT ULTRASOUND COMPLETE  Comparison:  CT abdomen and pelvis 12/15/2010.  Findings:  Right Kidney:  Measures 12.7 cm and appears normal without stone, mass or hydronephrosis.  Left Kidney:  Measures 12.4 cm.  There is minimal fullness of the renal collecting system.  No stone or mass identified.  Bladder:  Incompletely distended but otherwise unremarkable.  IMPRESSION: Minimal fullness of the left renal collecting system.  The examination is otherwise negative.  Original Report Authenticated By: Bernadene Bell. Maricela Curet, M.D.     No diagnosis found.    MDM  Pt is seen and examined;  Initial history and physical completed.  Will follow.          [place] Moss A. Patrica Duel, MD 12/18/10 0053  Results for orders placed during the hospital encounter of 12/17/10  CBC      Component Value Range   WBC 9.7  4.0 - 10.5 (K/uL)   RBC 4.38  4.22 - 5.81 (MIL/uL)   Hemoglobin 13.1  13.0 - 17.0 (g/dL)   HCT 45.4  09.8 - 11.9 (%)   MCV 90.4  78.0 - 100.0 (fL)   MCH 29.9  26.0 - 34.0 (pg)   MCHC 33.1  30.0 - 36.0 (g/dL)   RDW 14.7  82.9 - 56.2 (%)   Platelets 170  150 - 400 (K/uL)  DIFFERENTIAL      Component Value Range   Neutrophils Relative 72  43 - 77 (%)   Neutro Abs 7.0  1.7 - 7.7 (K/uL)   Lymphocytes Relative 19  12 - 46 (%)   Lymphs Abs 1.8  0.7 - 4.0 (K/uL)   Monocytes Relative 7  3 - 12 (%)   Monocytes Absolute 0.7  0.1 - 1.0 (K/uL)   Eosinophils Relative 1  0 - 5 (%)   Eosinophils Absolute 0.1  0.0 - 0.7 (K/uL)   Basophils Relative 0  0 - 1 (%)   Basophils Absolute 0.0  0.0 - 0.1 (K/uL)  COMPREHENSIVE METABOLIC PANEL      Component Value Range   Sodium 140  135 - 145 (mEq/L)   Potassium 3.7  3.5 - 5.1 (mEq/L)   Chloride 106  96 - 112 (mEq/L)   CO2 26  19 - 32 (mEq/L)   Glucose, Bld 100 (*) 70 - 99 (mg/dL)   BUN 18  6 - 23 (mg/dL)   Creatinine, Ser 1.30 (*) 0.50 - 1.35 (mg/dL)   Calcium 8.6  8.4 - 86.5 (mg/dL)   Total Protein 6.1  6.0 - 8.3 (g/dL)   Albumin 3.4 (*) 3.5 - 5.2 (g/dL)     AST 14  0 - 37 (U/L)   ALT 13  0 - 53 (U/L)   Alkaline Phosphatase 58  39 - 117 (U/L)   Total Bilirubin 0.8  0.3 - 1.2 (mg/dL)   GFR calc non Af Amer 55 (*) >  90 (mL/min)   GFR calc Af Amer 64 (*) >90 (mL/min)  LIPASE, BLOOD      Component Value Range   Lipase 14  11 - 59 (U/L)   Ct Abdomen Pelvis Wo Contrast  12/15/2010  *RADIOLOGY REPORT*  Clinical Data: Left flank pain; history of renal stone.  CT ABDOMEN AND PELVIS WITHOUT CONTRAST  Technique:  Multidetector CT imaging of the abdomen and pelvis was performed following the standard protocol without intravenous contrast.  Comparison: CT of the abdomen and pelvis performed 01/08/2010  Findings: Minimal bibasilar atelectasis is noted.  Some of this is slightly nodular in appearance; given the patient's age, malignancy is highly unlikely.  The liver and spleen are unremarkable in appearance.  The gallbladder is within normal limits.  The pancreas and adrenal glands are unremarkable.  There is very mild left-sided hydronephrosis, with mild left-sided perinephric stranding, and prominence of the proximal left ureter to the level of an obstructing 6 x 4 mm stone in the proximal left ureter, 4 cm below the left renal pelvis.  The right kidney is unremarkable in appearance.  No nonobstructing renal stones are seen.  No free fluid is identified.  The small bowel is unremarkable in appearance.  The stomach is within normal limits.  No acute vascular abnormalities are seen.  The appendix is normal in caliber, without evidence for appendicitis.  The colon is partially filled with stool and is unremarkable in appearance.  The bladder is decompressed and not well assessed.  The prostate remains normal in size.  No inguinal lymphadenopathy is seen.  No acute osseous abnormalities are identified.  IMPRESSION: Very mild left-sided hydronephrosis, with mild left-sided perinephric stranding, and an obstructing 6 x 4 mm stone in the proximal left ureter, 4 cm below the  left renal pelvis.  Original Report Authenticated By: Tonia Ghent, M.D.   Dg Abd 1 View  12/18/2010  *RADIOLOGY REPORT*  Clinical Data: Left-sided flank pain  ABDOMEN - 1 VIEW  Comparison: 12/15/2010 CT  Findings: 6 mm craniocaudal dimension calcification projecting over the expected course of the mid-left ureter. May have migrated slightly in the interval.  No other calcifications identified. Nonobstructive bowel gas pattern.  Organ outlines normal where seen.  No acute osseous abnormality.  IMPRESSION: 6 mm mid left ureteral stone.  Original Report Authenticated By: Waneta Martins, M.D.   US Renal  12/17/2010  *RADIOLOGY REPORT*  Clinical Data: Nausea and vomiting.  The left ureteral stone.  RENAL/URINARY TRACT ULTRASOUND COMPLETE  Comparison:  CT abdomen and pelvis 12/15/2010.  Findings:  Right Kidney:  Measures 12.7 cm and appears normal without stone, mass or hydronephrosis.  Left Kidney:  Measures 12.4 cm.  There is minimal fullness of the renal collecting system.  No stone or mass identified.  Bladder:  Incompletely distended but otherwise unremarkable.  IMPRESSION: Minimal fullness of the left renal collecting system.  The examination is otherwise negative.  Original Report Authenticated By: Bernadene Bell. D'ALESSIO, M.D.    4:31 AM Discuss with the urologist on call, Dr. Brunilda Payor ,  he will see the patient in the ER later this morning for further evaluation and treatment    Kendallyn Lippold A. Patrica Duel, MD 12/18/10 [phone]

## 2010-12-20 ENCOUNTER — Emergency Department (HOSPITAL_COMMUNITY): Payer: Self-pay

## 2010-12-20 ENCOUNTER — Encounter (HOSPITAL_COMMUNITY): Payer: Self-pay | Admitting: *Deleted

## 2010-12-20 ENCOUNTER — Emergency Department (HOSPITAL_COMMUNITY)
Admission: EM | Admit: 2010-12-20 | Discharge: 2010-12-20 | Disposition: A | Discharge status: Home / Self Care | Payer: Self-pay | Attending: Emergency Medicine | Admitting: Emergency Medicine

## 2010-12-20 DIAGNOSIS — Z87442 Personal history of urinary calculi: Secondary | ICD-10-CM | POA: Insufficient documentation

## 2010-12-20 DIAGNOSIS — R112 Nausea with vomiting, unspecified: Secondary | ICD-10-CM | POA: Insufficient documentation

## 2010-12-20 DIAGNOSIS — R109 Unspecified abdominal pain: Secondary | ICD-10-CM | POA: Insufficient documentation

## 2010-12-20 DIAGNOSIS — R231 Pallor: Secondary | ICD-10-CM | POA: Insufficient documentation

## 2010-12-20 DIAGNOSIS — Z9889 Other specified postprocedural states: Secondary | ICD-10-CM | POA: Insufficient documentation

## 2010-12-20 HISTORY — DX: Calculus of kidney: N20.0

## 2010-12-20 LAB — URINALYSIS, ROUTINE W REFLEX MICROSCOPIC
Bilirubin Urine: NEGATIVE
Ketones, ur: NEGATIVE mg/dL
Nitrite: NEGATIVE
Specific Gravity, Urine: 1.013 (ref 1.005–1.030)
Urobilinogen, UA: 1 mg/dL (ref 0.0–1.0)

## 2010-12-20 LAB — POCT I-STAT, CHEM 8
BUN: 14 mg/dL (ref 6–23)
Calcium, Ion: 1.14 mmol/L (ref 1.12–1.32)
Chloride: 105 mEq/L (ref 96–112)
Creatinine, Ser: 1.7 mg/dL — ABNORMAL HIGH (ref 0.50–1.35)
Glucose, Bld: 103 mg/dL — ABNORMAL HIGH (ref 70–99)
HCT: 40 % (ref 39.0–52.0)
Hemoglobin: 13.6 g/dL (ref 13.0–17.0)
Potassium: 4.2 mEq/L (ref 3.5–5.1)
Sodium: 142 meq/L (ref 135–145)
TCO2: 26 mmol/L (ref 0–100)

## 2010-12-20 LAB — URINE MICROSCOPIC-ADD ON

## 2010-12-20 MED ORDER — ONDANSETRON HCL 4 MG/2ML IJ SOLN
4.0000 mg | Freq: Once | INTRAMUSCULAR | Status: AC
  Administered 2010-12-20: 4 mg via INTRAVENOUS
  Filled 2010-12-20: qty 2

## 2010-12-20 MED ORDER — SULFAMETHOXAZOLE-TRIMETHOPRIM 800-160 MG PO TABS: 1.0000 | ORAL_TABLET | Freq: Two times a day (BID) | ORAL | Status: DC

## 2010-12-20 MED ORDER — HYDROMORPHONE HCL PF 2 MG/ML IJ SOLN
INTRAMUSCULAR | Status: AC
  Filled 2010-12-20: qty 1

## 2010-12-20 MED ORDER — HYDROMORPHONE HCL PF 1 MG/ML IJ SOLN
1.0000 mg | Freq: Once | INTRAMUSCULAR | Status: AC
  Administered 2010-12-20: 1 mg via INTRAVENOUS

## 2010-12-20 MED ORDER — HYDROMORPHONE HCL PF 2 MG/ML IJ SOLN
INTRAMUSCULAR | Status: AC
  Administered 2010-12-20: 1 mg
  Filled 2010-12-20: qty 1

## 2010-12-20 MED ORDER — SODIUM CHLORIDE 0.9 % IV SOLN
Freq: Once | INTRAVENOUS | Status: AC
  Administered 2010-12-20 (×2): via INTRAVENOUS

## 2010-12-20 MED ORDER — SULFAMETHOXAZOLE-TMP DS 800-160 MG PO TABS: 1.0000 | ORAL_TABLET | Freq: Once | ORAL | Status: DC

## 2010-12-20 NOTE — ED Notes (Signed)
Pt states woke at 2300 w/ n/v and L flank pain. Pt is s/p lithotripsy.

## 2010-12-20 NOTE — ED Provider Notes (Signed)
Evaluation and management procedures were performed by the mid-level provider (PA/NP/CNM) under my supervision/collaboration. I was present and available during the ED course. Mimi Debellis Y.   Kamber Vignola Y. Gannon Heinzman, MD 12/20/10 0648 

## 2010-12-20 NOTE — ED Provider Notes (Signed)
History     CSN: [id number] Arrival date & time: 12/20/2010  2:59 AM   First MD Initiated Contact with Patient 12/20/10 0446      Chief Complaint  Patient presents with  . Nausea  . Emesis  . Flank Pain    s/p lithotripsy 12/18/10    (Consider location/radiation/quality/duration/timing/severity/associated sxs/prior treatment) HPI Comments: Mr. Kathreen Cosier and had a stent placed for a large kidney stone on November 17 was told by the urologist that the stone was broken up and he should be avoiding out particles as of this evening.  Patient is unaware of passing any stone for was awakened approximately one hour ago with excruciating left flank pain radiating to his abdomen nausea and vomiting.  Not controlled by his home Percocet and Phenergan  Patient is a 30 y.o. male presenting with vomiting and flank pain. The history is provided by the patient.  Emesis  This is a recurrent problem. The current episode started less than 1 hour ago. The problem has not changed since onset.The emesis has an appearance of stomach contents. There has been no fever. Associated symptoms include abdominal pain. Pertinent negatives include no chills and no fever.  Flank Pain Associated symptoms include abdominal pain, nausea and vomiting. Pertinent negatives include no chills or fever.    Past Medical History  Diagnosis Date  . High cholesterol   . Difficult intubation   . Kidney calculi     Past Surgical History  Procedure Date  . Lithotripsy     History reviewed. No pertinent family history.  History  Substance Use Topics  . Smoking status: Current Everyday Smoker -- 1.0 packs/day    Types: Cigarettes  . Smokeless tobacco: Not on file  . Alcohol Use: Yes     occasionally      Review of Systems  Constitutional: Negative for fever and chills.  HENT: Negative.   Eyes: Negative.   Respiratory: Negative.   Cardiovascular: Negative.   Gastrointestinal: Positive for nausea, vomiting and  abdominal pain.  Genitourinary: Positive for flank pain. Negative for dysuria.  Musculoskeletal: Negative.   Skin: Positive for pallor.  Neurological: Negative.   Hematological: Negative.   Psychiatric/Behavioral: Negative.     Allergies  Review of patient's allergies indicates no known allergies.  Home Medications   Current Outpatient Rx  Name Route Sig Dispense Refill  . OXYCODONE-ACETAMINOPHEN 5-325 MG PO TABS Oral Take 1 tablet by mouth every 4 (four) hours as needed for pain. 35 tablet 0  . PROMETHAZINE HCL 25 MG PO TABS Oral Take 1 tablet (25 mg total) by mouth every 6 (six) hours as needed for nausea. 20 tablet 0    BP 145/91  Pulse 80  Temp(Src) 98 F (36.7 C) (Oral)  Resp 20  Ht 6' (1.829 m)  Wt 214 lb 8.1 oz (97.3 kg)  BMI 29.09 kg/m2  SpO2 96%  Physical Exam  Constitutional: He is oriented to person, place, and time. He appears well-developed and well-nourished.  HENT:  Head: Atraumatic.  Eyes: EOM are normal.  Neck: Neck supple.  Cardiovascular: Regular rhythm.   Pulmonary/Chest: Breath sounds normal.  Abdominal: Soft. He exhibits no distension. There is no tenderness.  Musculoskeletal: Normal range of motion.  Neurological: He is oriented to person, place, and time.  Skin: Skin is warm. There is pallor.  Psychiatric: He has a normal mood and affect.    ED Course  Procedures (including critical care time)   Labs Reviewed  I-STAT, CHEM 8  URINALYSIS,  ROUTINE W REFLEX MICROSCOPIC   No results found.   No diagnosis found.    MDM  Urinary tract infection, renal colic, hydronephrosis         Arman Filter, NP 12/20/10 0507  Arman Filter, NP 12/20/10 [phone]

## 2010-12-21 ENCOUNTER — Encounter (HOSPITAL_COMMUNITY): Payer: Self-pay | Admitting: Urology

## 2010-12-21 LAB — URINE CULTURE
Culture  Setup Time: 201211191051
Culture: NO GROWTH

## 2010-12-28 ENCOUNTER — Emergency Department (HOSPITAL_BASED_OUTPATIENT_CLINIC_OR_DEPARTMENT_OTHER)
Admission: EM | Admit: 2010-12-28 | Discharge: 2010-12-28 | Disposition: A | Discharge status: Home / Self Care | Payer: Self-pay | Attending: Emergency Medicine | Admitting: Emergency Medicine

## 2010-12-28 ENCOUNTER — Encounter (HOSPITAL_BASED_OUTPATIENT_CLINIC_OR_DEPARTMENT_OTHER): Payer: Self-pay | Admitting: Student

## 2010-12-28 DIAGNOSIS — R109 Unspecified abdominal pain: Secondary | ICD-10-CM | POA: Insufficient documentation

## 2010-12-28 DIAGNOSIS — F17200 Nicotine dependence, unspecified, uncomplicated: Secondary | ICD-10-CM | POA: Insufficient documentation

## 2010-12-28 DIAGNOSIS — E7800 Pure hypercholesterolemia, unspecified: Secondary | ICD-10-CM | POA: Insufficient documentation

## 2010-12-28 HISTORY — DX: Calculus of kidney: N20.0

## 2010-12-28 LAB — URINE MICROSCOPIC-ADD ON

## 2010-12-28 LAB — URINALYSIS, ROUTINE W REFLEX MICROSCOPIC
Bilirubin Urine: NEGATIVE
Glucose, UA: NEGATIVE mg/dL
Ketones, ur: NEGATIVE mg/dL
Specific Gravity, Urine: 1.016 (ref 1.005–1.030)
pH: 5.5 (ref 5.0–8.0)

## 2010-12-28 MED ORDER — HYDROMORPHONE HCL PF 2 MG/ML IJ SOLN
2.0000 mg | Freq: Once | INTRAMUSCULAR | Status: AC
  Administered 2010-12-28: 2 mg via INTRAMUSCULAR
  Filled 2010-12-28: qty 1

## 2010-12-28 MED ORDER — ONDANSETRON HCL 4 MG/2ML IJ SOLN
4.0000 mg | Freq: Once | INTRAMUSCULAR | Status: AC
  Administered 2010-12-28: 4 mg via INTRAVENOUS
  Filled 2010-12-28: qty 2

## 2010-12-28 MED ORDER — KETOROLAC TROMETHAMINE 60 MG/2ML IM SOLN
60.0000 mg | Freq: Once | INTRAMUSCULAR | Status: AC
  Administered 2010-12-28: 60 mg via INTRAMUSCULAR
  Filled 2010-12-28: qty 2

## 2010-12-28 MED ORDER — HYDROMORPHONE HCL PF 1 MG/ML IJ SOLN
1.0000 mg | Freq: Once | INTRAMUSCULAR | Status: AC
  Administered 2010-12-28: 1 mg via INTRAVENOUS
  Filled 2010-12-28: qty 1

## 2010-12-28 MED ORDER — OXYCODONE-ACETAMINOPHEN 5-325 MG PO TABS: 2.0000 | ORAL_TABLET | ORAL | Status: AC | PRN

## 2010-12-28 MED ORDER — ONDANSETRON 4 MG PO TBDP
4.0000 mg | ORAL_TABLET | Freq: Once | ORAL | Status: AC
  Administered 2010-12-28: 4 mg via ORAL
  Filled 2010-12-28: qty 1

## 2010-12-28 NOTE — ED Provider Notes (Signed)
Medical screening examination/treatment/procedure(s) were performed by non-physician practitioner and as supervising physician I was immediately available for consultation/collaboration.   Gwyneth Sprout, MD 12/28/10 1920

## 2010-12-28 NOTE — ED Provider Notes (Signed)
History     CSN: [id number] Arrival date & time: 12/28/2010 11:57 AM   First MD Initiated Contact with Patient 12/28/10 1213      Chief Complaint  Patient presents with  . Flank Pain    (Consider location/radiation/quality/duration/timing/severity/associated sxs/prior treatment) HPI Comments: Pt state that he saw Dr. Brunilda Payor this morning and had the stent removed and then he had onset of flank pain:pt denies n/v  Patient is a 30 y.o. male presenting with flank pain. The history is provided by the patient. No language interpreter was used.  Flank Pain This is a recurrent problem. The current episode started today. The problem occurs constantly. The problem has been unchanged. Pertinent negatives include no fever, numbness or vomiting. The symptoms are aggravated by nothing. He has tried nothing for the symptoms.    Past Medical History  Diagnosis Date  . High cholesterol   . Difficult intubation   . Kidney calculi   . Kidney stones     Past Surgical History  Procedure Date  . Lithotripsy   . Cystoscopy w/ ureteral stent placement 12/18/2010    Procedure: CYSTOSCOPY WITH RETROGRADE PYELOGRAM/URETERAL STENT PLACEMENT;  Surgeon: Lindaann Slough, MD;  Location: [facility];  Service: Urology;  Laterality: Left;  left ureteroscopy    History reviewed. No pertinent family history.  History  Substance Use Topics  . Smoking status: Current Everyday Smoker -- 1.0 packs/day    Types: Cigarettes  . Smokeless tobacco: Not on file  . Alcohol Use: Yes     occasionally      Review of Systems  Constitutional: Negative for fever.  Gastrointestinal: Negative for vomiting.  Genitourinary: Positive for flank pain.  Neurological: Negative for numbness.  All other systems reviewed and are negative.    Allergies  Review of patient's allergies indicates no known allergies.  Home Medications  No current outpatient prescriptions on file.  BP 146/87  Pulse 90  Temp(Src) 98.4 F (36.9  C) (Oral)  Resp 20  Wt 237 lb (107.502 kg)  SpO2 99%  Physical Exam  Nursing note and vitals reviewed. Constitutional: He is oriented to person, place, and time. He appears well-developed and well-nourished. He appears distressed.  HENT:  Head: Normocephalic and atraumatic.  Cardiovascular: Normal rate and regular rhythm.   Pulmonary/Chest: Effort normal and breath sounds normal.  Abdominal: Soft. Bowel sounds are normal.  Neurological: He is alert and oriented to person, place, and time.  Skin: Skin is warm and dry.  Psychiatric: He has a normal mood and affect.    ED Course  Procedures (including critical care time)  Labs Reviewed  URINALYSIS, ROUTINE W REFLEX MICROSCOPIC - Abnormal; Notable for the following:    Appearance CLOUDY (*)    Hgb urine dipstick SMALL (*)    Protein, ur 100 (*)    All other components within normal limits  URINE MICROSCOPIC-ADD ON - Abnormal; Notable for the following:    Casts HYALINE CASTS (*)    All other components within normal limits   No results found.   1. Flank pain       MDM  Spoke with Dr. Brunilda Payor staff who said to treat pain and pt call follow up with them as needed:pt  Pain is under control        Teressa Lower, NP 12/28/10 1405

## 2010-12-28 NOTE — ED Notes (Signed)
I removed catheter per nurse and took vitals.

## 2010-12-28 NOTE — ED Notes (Signed)
Pt in with c/o left flank pain radiating to left testicle s/p removasl of stent in left kidney for recent lithotripsy for left kidney stone. Pt reports pain with urination but denies hematuria.

## 2011-03-06 ENCOUNTER — Emergency Department (HOSPITAL_BASED_OUTPATIENT_CLINIC_OR_DEPARTMENT_OTHER)
Admission: EM | Admit: 2011-03-06 | Discharge: 2011-03-06 | Disposition: A | Discharge status: Home / Self Care | Payer: Self-pay | Attending: Emergency Medicine | Admitting: Emergency Medicine

## 2011-03-06 ENCOUNTER — Emergency Department (INDEPENDENT_AMBULATORY_CARE_PROVIDER_SITE_OTHER): Payer: Self-pay

## 2011-03-06 ENCOUNTER — Encounter (HOSPITAL_BASED_OUTPATIENT_CLINIC_OR_DEPARTMENT_OTHER): Payer: Self-pay | Admitting: *Deleted

## 2011-03-06 DIAGNOSIS — W19XXXA Unspecified fall, initial encounter: Secondary | ICD-10-CM

## 2011-03-06 DIAGNOSIS — W1809XA Striking against other object with subsequent fall, initial encounter: Secondary | ICD-10-CM | POA: Insufficient documentation

## 2011-03-06 DIAGNOSIS — S83419A Sprain of medial collateral ligament of unspecified knee, initial encounter: Secondary | ICD-10-CM | POA: Insufficient documentation

## 2011-03-06 DIAGNOSIS — M25569 Pain in unspecified knee: Secondary | ICD-10-CM

## 2011-03-06 DIAGNOSIS — F17200 Nicotine dependence, unspecified, uncomplicated: Secondary | ICD-10-CM | POA: Insufficient documentation

## 2011-03-06 DIAGNOSIS — S8000XA Contusion of unspecified knee, initial encounter: Secondary | ICD-10-CM | POA: Insufficient documentation

## 2011-03-06 DIAGNOSIS — S8002XA Contusion of left knee, initial encounter: Secondary | ICD-10-CM

## 2011-03-06 DIAGNOSIS — E7800 Pure hypercholesterolemia, unspecified: Secondary | ICD-10-CM | POA: Insufficient documentation

## 2011-03-06 DIAGNOSIS — Y9289 Other specified places as the place of occurrence of the external cause: Secondary | ICD-10-CM | POA: Insufficient documentation

## 2011-03-06 DIAGNOSIS — S83412A Sprain of medial collateral ligament of left knee, initial encounter: Secondary | ICD-10-CM

## 2011-03-06 DIAGNOSIS — M25469 Effusion, unspecified knee: Secondary | ICD-10-CM

## 2011-03-06 DIAGNOSIS — R918 Other nonspecific abnormal finding of lung field: Secondary | ICD-10-CM

## 2011-03-06 DIAGNOSIS — J45909 Unspecified asthma, uncomplicated: Secondary | ICD-10-CM | POA: Insufficient documentation

## 2011-03-06 DIAGNOSIS — S2239XA Fracture of one rib, unspecified side, initial encounter for closed fracture: Secondary | ICD-10-CM | POA: Insufficient documentation

## 2011-03-06 MED ORDER — HYDROMORPHONE HCL PF 2 MG/ML IJ SOLN
2.0000 mg | Freq: Once | INTRAMUSCULAR | Status: AC
  Administered 2011-03-06: 2 mg via INTRAMUSCULAR
  Filled 2011-03-06: qty 1

## 2011-03-06 MED ORDER — OXYCODONE-ACETAMINOPHEN 5-325 MG PO TABS: 2.0000 | ORAL_TABLET | ORAL | Status: AC | PRN

## 2011-03-06 MED ORDER — ALBUTEROL SULFATE HFA 108 (90 BASE) MCG/ACT IN AERS: 2.0000 | INHALATION_SPRAY | RESPIRATORY_TRACT | Status: DC | PRN

## 2011-03-06 NOTE — ED Notes (Signed)
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 States he was caught on the ribs. Now having pain in right knee, right side and hurts when he breathes.

## 2011-03-06 NOTE — ED Provider Notes (Signed)
History     CSN: [id number]  Arrival date & time 03/06/11  0458   First MD Initiated Contact with Patient 03/06/11 [phone]      Chief Complaint  Patient presents with  . Fall    (Consider location/radiation/quality/duration/timing/severity/associated sxs/prior treatment) HPI This 31 year old male was working about midnight several hours ago when he accidentally tripped and fell into a manhole and hit his left knee medial aspect as well as his right lateral chest on the sides of the whole as he was falling and causing localized sharp nonradiating tender pain to those areas without associated symptoms. He is a chronic cough and chronic wheezing but that is unchanged today and is not short of breath. He did not injure his head or neck and he and no amnesia or loss of consciousness. No headache or neck pain or midline back pain. He is no lateralizing weakness or numbness. He has no change in speech or vision. He has no amnesia. He has no abdominal pain. His complaints again are right lateral chest wall tenderness and left medial knee pain and tenderness. He is able to walk with a limp he is able to fully extend his left knee and he is able to flex his left knee to 90. Past Medical History  Diagnosis Date  . High cholesterol   . Difficult intubation   . Kidney calculi   . Kidney stones    asthma  Past Surgical History  Procedure Date  . Lithotripsy   . Cystoscopy w/ ureteral stent placement 12/18/2010    Procedure: CYSTOSCOPY WITH RETROGRADE PYELOGRAM/URETERAL STENT PLACEMENT;  Surgeon: Lindaann Slough, MD;  Location: [facility];  Service: Urology;  Laterality: Left;  left ureteroscopy    No family history on file.  History  Substance Use Topics  . Smoking status: Current Everyday Smoker -- 1.0 packs/day    Types: Cigarettes  . Smokeless tobacco: Not on file  . Alcohol Use: Yes     occasionally      Review of Systems  Constitutional: Negative for fever.       10 Systems reviewed  and are negative for acute change except as noted in the HPI.  HENT: Negative for congestion.   Eyes: Negative for discharge and redness.  Respiratory: Positive for cough and wheezing. Negative for shortness of breath.   Cardiovascular: Positive for chest pain.  Gastrointestinal: Negative for vomiting and abdominal pain.  Musculoskeletal: Negative for back pain.  Skin: Negative for rash.  Neurological: Negative for syncope, numbness and headaches.  Psychiatric/Behavioral:       No behavior change.    Allergies  Review of patient's allergies indicates no known allergies.  Home Medications   Current Outpatient Rx  Name Route Sig Dispense Refill  . OMEPRAZOLE 10 MG PO CPDR Oral Take 10 mg by mouth daily.    . ALBUTEROL SULFATE HFA 108 (90 BASE) MCG/ACT IN AERS Inhalation Inhale 2 puffs into the lungs every 2 (two) hours as needed for wheezing or shortness of breath (cough). 1 Inhaler 0  . OXYCODONE-ACETAMINOPHEN 5-325 MG PO TABS Oral Take 2 tablets by mouth every 4 (four) hours as needed for pain. 20 tablet 0    BP 131/65  Pulse 89  Temp(Src) 98.5 F (36.9 C) (Oral)  Resp 20  SpO2 98%  Physical Exam  Nursing note and vitals reviewed. Constitutional:       Awake, alert, nontoxic appearance.  HENT:  Head: Atraumatic.  Eyes: Right eye exhibits no discharge. Left eye exhibits  no discharge.  Neck: Neck supple.       Cervical spine is nontender  Cardiovascular: Normal rate and regular rhythm.   No murmur heard. Pulmonary/Chest: Effort normal. No respiratory distress. He has wheezes. He has no rales. He exhibits tenderness.       Reproducible right lateral chest wall tenderness without obvious flail chest, his lungs show a few faint scattered expiratory wheezes which he states is baseline for him with no crackles no rhonchi no retractions and no accessory muscle usage  Abdominal: Soft. Bowel sounds are normal. There is no tenderness. There is no rebound.  Musculoskeletal: He  exhibits tenderness. He exhibits no edema.       Baseline ROM, no obvious new focal weakness. His back is nontender. His arms and right leg are nontender. His left leg is nontender at the hip thigh calf ankle and foot with capillary refill less than 2 seconds and his foot was normal light touch to his foot with dorsalis pedis pulse intact of his left foot and left knee has a stable examination with negative Lachman's test and negative McMurray's testing no laxity with varus or valgus stress testing no patellar tenderness no patellar tendon tenderness he is able to flex his knee to 90 as well as fully extend it but he does have some medial knee tenderness with ecchymosis  Neurological:       Mental status and motor strength appears baseline for patient and situation.  Skin: No rash noted.  Psychiatric: He has a normal mood and affect.    ED Course  Procedures (including critical care time)  Labs Reviewed - No data to display Dg Chest 2 View  03/06/2011  *RADIOLOGY REPORT*  Clinical Data: Status post fall  CHEST - 2 VIEW  Comparison: 02/10/2009  Findings: Mild lingular opacity. Mild bronchitic changes appear similar to prior.  No pleural effusion or pneumothorax.  No acute osseous abnormality.  IMPRESSION: Mild lingular opacity; atelectasis versus early infiltrate.  Original Report Authenticated By: Waneta Martins, M.D.   Dg Knee Complete 4 Views Left  03/06/2011  *RADIOLOGY REPORT*  Clinical Data: Left knee pain status post fall.  LEFT KNEE - COMPLETE 4+ VIEW  Comparison: None.  Findings: No displaced fracture or dislocation identified.  Tiny joint effusion.  No aggressive osseous lesion.  IMPRESSION: No acute osseous abnormality.  Tiny joint effusion. If clinical concern for a fracture persists, recommend a repeat radiograph in 5- 10 days to evaluate for interval change or callus formation.  Original Report Authenticated By: Waneta Martins, M.D.  Clinically I do not suspect pneumonia  therefore his chest x-ray findings are more suggestive of atelectasis and did not think antibiotics are necessary at this time.   1. Rib fracture   2. Contusion of left knee   3. Sprain of medial collateral ligament of left knee       MDM  I doubt any other EMC precluding discharge at this time including, but not necessarily limited to the following:TBI.        Hurman Horn, MD 03/07/11 2147

## 2011-03-11 ENCOUNTER — Encounter (HOSPITAL_BASED_OUTPATIENT_CLINIC_OR_DEPARTMENT_OTHER): Payer: Self-pay | Admitting: *Deleted

## 2011-03-11 ENCOUNTER — Emergency Department (HOSPITAL_BASED_OUTPATIENT_CLINIC_OR_DEPARTMENT_OTHER)
Admission: EM | Admit: 2011-03-11 | Discharge: 2011-03-11 | Disposition: A | Discharge status: Home / Self Care | Payer: Self-pay | Attending: Emergency Medicine | Admitting: Emergency Medicine

## 2011-03-11 ENCOUNTER — Emergency Department (INDEPENDENT_AMBULATORY_CARE_PROVIDER_SITE_OTHER): Payer: Self-pay

## 2011-03-11 DIAGNOSIS — F17200 Nicotine dependence, unspecified, uncomplicated: Secondary | ICD-10-CM | POA: Insufficient documentation

## 2011-03-11 DIAGNOSIS — R079 Chest pain, unspecified: Secondary | ICD-10-CM | POA: Insufficient documentation

## 2011-03-11 DIAGNOSIS — Y92410 Unspecified street and highway as the place of occurrence of the external cause: Secondary | ICD-10-CM | POA: Insufficient documentation

## 2011-03-11 DIAGNOSIS — W1789XA Other fall from one level to another, initial encounter: Secondary | ICD-10-CM | POA: Insufficient documentation

## 2011-03-11 DIAGNOSIS — R0781 Pleurodynia: Secondary | ICD-10-CM

## 2011-03-11 DIAGNOSIS — E7800 Pure hypercholesterolemia, unspecified: Secondary | ICD-10-CM | POA: Insufficient documentation

## 2011-03-11 MED ORDER — OXYCODONE-ACETAMINOPHEN 5-325 MG PO TABS
1.0000 | ORAL_TABLET | Freq: Four times a day (QID) | ORAL | Status: AC | PRN
Start: 2011-03-11 — End: 2011-03-21

## 2011-03-11 MED ORDER — ONDANSETRON 8 MG PO TBDP
8.0000 mg | ORAL_TABLET | Freq: Once | ORAL | Status: AC
  Administered 2011-03-11: 8 mg via ORAL
  Filled 2011-03-11: qty 1

## 2011-03-11 MED ORDER — HYDROMORPHONE HCL PF 2 MG/ML IJ SOLN
2.0000 mg | Freq: Once | INTRAMUSCULAR | Status: AC
  Administered 2011-03-11: 2 mg via INTRAMUSCULAR
  Filled 2011-03-11: qty 1

## 2011-03-11 NOTE — ED Notes (Signed)
Rib injury last week seen here Sunday pt concerned he has developed a cough and "feels a bubbling" wants to be sure all is ok

## 2011-03-11 NOTE — ED Provider Notes (Signed)
History     CSN: [id number]  Arrival date & time 03/11/11  1038   First MD Initiated Contact with Patient 03/11/11 1100      Chief Complaint  Patient presents with  . Rib Injury    (Consider location/radiation/quality/duration/timing/severity/associated sxs/prior treatment) Patient is a 31 y.o. male presenting with chest pain. The history is provided by the patient.  Chest Pain The chest pain began 5 - 7 days ago (Patient was working last week and fell in a manhole hurting his right ribs). Chest pain occurs constantly. The chest pain is unchanged. The pain is associated with breathing and coughing. At its most intense, the pain is at 9/10. The pain is currently at 5/10. The severity of the pain is moderate. The quality of the pain is described as pleuritic and sharp. The pain does not radiate. Chest pain is worsened by certain positions and deep breathing. Primary symptoms include cough. Pertinent negatives for primary symptoms include no shortness of breath, no nausea and no vomiting. He tried narcotics for the symptoms. Risk factors include no known risk factors.     Past Medical History  Diagnosis Date  . High cholesterol   . Difficult intubation   . Kidney calculi   . Kidney stones     Past Surgical History  Procedure Date  . Lithotripsy   . Cystoscopy w/ ureteral stent placement 12/18/2010    Procedure: CYSTOSCOPY WITH RETROGRADE PYELOGRAM/URETERAL STENT PLACEMENT;  Surgeon: Lindaann Slough, MD;  Location: [facility];  Service: Urology;  Laterality: Left;  left ureteroscopy    History reviewed. No pertinent family history.  History  Substance Use Topics  . Smoking status: Current Everyday Smoker -- 1.0 packs/day    Types: Cigarettes  . Smokeless tobacco: Not on file  . Alcohol Use: Yes     occasionally      Review of Systems  Respiratory: Positive for cough. Negative for shortness of breath.   Cardiovascular: Positive for chest pain.  Gastrointestinal: Negative for  nausea and vomiting.  All other systems reviewed and are negative.    Allergies  Review of patient's allergies indicates no known allergies.  Home Medications   Current Outpatient Rx  Name Route Sig Dispense Refill  . ALBUTEROL SULFATE HFA 108 (90 BASE) MCG/ACT IN AERS Inhalation Inhale 2 puffs into the lungs every 2 (two) hours as needed for wheezing or shortness of breath (cough). 1 Inhaler 0  . OMEPRAZOLE 10 MG PO CPDR Oral Take 10 mg by mouth daily.    . OXYCODONE-ACETAMINOPHEN 5-325 MG PO TABS Oral Take 2 tablets by mouth every 4 (four) hours as needed for pain. 20 tablet 0    BP 131/80  Pulse 75  Temp(Src) 97.8 F (36.6 C) (Oral)  Resp 20  SpO2 98%  Physical Exam  Nursing note and vitals reviewed. Constitutional: He is oriented to person, place, and time. He appears well-developed and well-nourished. No distress.  HENT:  Head: Normocephalic and atraumatic.  Mouth/Throat: Oropharynx is clear and moist.  Eyes: Conjunctivae and EOM are normal. Pupils are equal, round, and reactive to light.  Neck: Normal range of motion. Neck supple.  Cardiovascular: Normal rate, regular rhythm and intact distal pulses.   No murmur heard. Pulmonary/Chest: Effort normal and breath sounds normal. No respiratory distress. He has no wheezes. He has no rales. He exhibits tenderness. He exhibits no crepitus.    Musculoskeletal: Normal range of motion. He exhibits no edema and no tenderness.  Neurological: He is alert and oriented  to person, place, and time.  Skin: Skin is warm and dry. No rash noted. No erythema.  Psychiatric: He has a normal mood and affect. His behavior is normal.    ED Course  Procedures (including critical care time)  Labs Reviewed - No data to display Dg Chest 2 View  03/11/2011  *RADIOLOGY REPORT*  Clinical Data: Rib fracture.  Worsening pain.  CHEST - 2 VIEW  Comparison: None  Findings: The heart size and mediastinal contours are within normal limits.  Both lungs  are clear.  The visualized skeletal structures are unremarkable.  No displaced rib fractures are identified.  IMPRESSION:  1.  No acute findings noted. 2.  No displaced rib fractures identified.  Original Report Authenticated By: Rosealee Albee, M.D.     1. Rib pain       MDM   Patient diagnosed with a rib fracture last week and given pain medication. After he fell down a manhole. He states he ran out of pain medicine on Tuesday and was coughing yesterday and got severe pain and a bubbling feeling in the right side of his chest. There is no crepitus on exam he has good bilateral breath sounds and O2 sats are within normal limits. Chest x-ray shows no acute findings. Will discharge with more pain medication.       Gwyneth Sprout, MD 03/11/11 1309

## 2011-07-08 ENCOUNTER — Encounter (HOSPITAL_BASED_OUTPATIENT_CLINIC_OR_DEPARTMENT_OTHER): Payer: Self-pay | Admitting: *Deleted

## 2011-07-08 ENCOUNTER — Emergency Department (HOSPITAL_BASED_OUTPATIENT_CLINIC_OR_DEPARTMENT_OTHER)
Admission: EM | Admit: 2011-07-08 | Discharge: 2011-07-08 | Disposition: A | Discharge status: Home / Self Care | Payer: Self-pay | Attending: Emergency Medicine | Admitting: Emergency Medicine

## 2011-07-08 ENCOUNTER — Emergency Department (HOSPITAL_BASED_OUTPATIENT_CLINIC_OR_DEPARTMENT_OTHER): Payer: Self-pay

## 2011-07-08 DIAGNOSIS — E7800 Pure hypercholesterolemia, unspecified: Secondary | ICD-10-CM | POA: Insufficient documentation

## 2011-07-08 DIAGNOSIS — W268XXA Contact with other sharp object(s), not elsewhere classified, initial encounter: Secondary | ICD-10-CM | POA: Insufficient documentation

## 2011-07-08 DIAGNOSIS — F17200 Nicotine dependence, unspecified, uncomplicated: Secondary | ICD-10-CM | POA: Insufficient documentation

## 2011-07-08 DIAGNOSIS — S61409A Unspecified open wound of unspecified hand, initial encounter: Secondary | ICD-10-CM | POA: Insufficient documentation

## 2011-07-08 DIAGNOSIS — S61411A Laceration without foreign body of right hand, initial encounter: Secondary | ICD-10-CM

## 2011-07-08 DIAGNOSIS — Y990 Civilian activity done for income or pay: Secondary | ICD-10-CM | POA: Insufficient documentation

## 2011-07-08 DIAGNOSIS — Z23 Encounter for immunization: Secondary | ICD-10-CM | POA: Insufficient documentation

## 2011-07-08 MED ORDER — TETANUS-DIPHTH-ACELL PERTUSSIS 5-2.5-18.5 LF-MCG/0.5 IM SUSP
0.5000 mL | Freq: Once | INTRAMUSCULAR | Status: AC
  Administered 2011-07-08: 0.5 mL via INTRAMUSCULAR
  Filled 2011-07-08: qty 0.5

## 2011-07-08 MED ORDER — LIDOCAINE HCL (PF) 1 % IJ SOLN
5.0000 mL | Freq: Once | INTRAMUSCULAR | Status: AC
  Administered 2011-07-08: 5 mL
  Filled 2011-07-08: qty 5

## 2011-07-08 MED ORDER — OXYCODONE-ACETAMINOPHEN 5-325 MG PO TABS
1.0000 | ORAL_TABLET | Freq: Once | ORAL | Status: AC
  Administered 2011-07-08: 1 via ORAL
  Filled 2011-07-08: qty 1

## 2011-07-08 NOTE — Discharge Instructions (Signed)
Your wound has been repaired with sutures. Please keep the area clean and dry; keep the finger straight and in a dressing to avoid popping the stitches. You can wash the area as normal but do not scrub at the stitches. Your sutures will need to be removed in 7 days. You can follow up with your primary care doctor, at urgent care, or in the ED for this. When the stitches come out, apply vitamin E, cocoa butter, or Mederma to help with scarring. Use extra sunscreen on the scar when out in the sun to avoid the scar darkening. If you notice pus from the wound, redness of the surrounding skin, fever, swelling, or other concerns about worsening condition, return to the ED.  Laceration Care, Adult A laceration is a cut or lesion that goes through all layers of the skin and into the tissue just beneath the skin. TREATMENT  Some lacerations may not require closure. Some lacerations may not be able to be closed due to an increased risk of infection. It is important to see your caregiver as soon as possible after an injury to minimize the risk of infection and maximize the opportunity for successful closure. If closure is appropriate, pain medicines may be given, if needed. The wound will be cleaned to help prevent infection. Your caregiver will use stitches (sutures), staples, wound glue (adhesive), or skin adhesive strips to repair the laceration. These tools bring the skin edges together to allow for faster healing and a better cosmetic outcome. However, all wounds will heal with a scar. Once the wound has healed, scarring can be minimized by covering the wound with sunscreen during the day for 1 full year. HOME CARE INSTRUCTIONS  For sutures or staples:  Keep the wound clean and dry.   If you were given a bandage (dressing), you should change it at least once a day. Also, change the dressing if it becomes wet or dirty, or as directed by your caregiver.   Wash the wound with soap and water 2 times a day. Rinse  the wound off with water to remove all soap. Pat the wound dry with a clean towel.   After cleaning, apply a thin layer of the antibiotic ointment as recommended by your caregiver. This will help prevent infection and keep the dressing from sticking.   You may shower as usual after the first 24 hours. Do not soak the wound in water until the sutures are removed.   Only take over-the-counter or prescription medicines for pain, discomfort, or fever as directed by your caregiver.   Get your sutures or staples removed as directed by your caregiver.  For skin adhesive strips:  Keep the wound clean and dry.   Do not get the skin adhesive strips wet. You may bathe carefully, using caution to keep the wound dry.   If the wound gets wet, pat it dry with a clean towel.   Skin adhesive strips will fall off on their own. You may trim the strips as the wound heals. Do not remove skin adhesive strips that are still stuck to the wound. They will fall off in time.  For wound adhesive:  You may briefly wet your wound in the shower or bath. Do not soak or scrub the wound. Do not swim. Avoid periods of heavy perspiration until the skin adhesive has fallen off on its own. After showering or bathing, gently pat the wound dry with a clean towel.   Do not apply liquid medicine, cream  medicine, or ointment medicine to your wound while the skin adhesive is in place. This may loosen the film before your wound is healed.   If a dressing is placed over the wound, be careful not to apply tape directly over the skin adhesive. This may cause the adhesive to be pulled off before the wound is healed.   Avoid prolonged exposure to sunlight or tanning lamps while the skin adhesive is in place. Exposure to ultraviolet light in the first year will darken the scar.   The skin adhesive will usually remain in place for 5 to 10 days, then naturally fall off the skin. Do not pick at the adhesive film.  You may need a tetanus  shot if:  You cannot remember when you had your last tetanus shot.   You have never had a tetanus shot.  If you get a tetanus shot, your arm may swell, get red, and feel warm to the touch. This is common and not a problem. If you need a tetanus shot and you choose not to have one, there is a rare chance of getting tetanus. Sickness from tetanus can be serious. SEEK MEDICAL CARE IF:   You have redness, swelling, or increasing pain in the wound.   You see a red line that goes away from the wound.   You have yellowish-white fluid (pus) coming from the wound.   You have a fever.   You notice a bad smell coming from the wound or dressing.   Your wound breaks open before or after sutures have been removed.   You notice something coming out of the wound such as wood or glass.   Your wound is on your hand or foot and you cannot move a finger or toe.  SEEK IMMEDIATE MEDICAL CARE IF:   Your pain is not controlled with prescribed medicine.   You have severe swelling around the wound causing pain and numbness or a change in color in your arm, hand, leg, or foot.   Your wound splits open and starts bleeding.   You have worsening numbness, weakness, or loss of function of any joint around or beyond the wound.   You develop painful lumps near the wound or on the skin anywhere on your body.  MAKE SURE YOU:   Understand these instructions.   Will watch your condition.   Will get help right away if you are not doing well or get worse.  Document Released: 01/17/2005 Document Revised: 01/06/2011 Document Reviewed: 07/13/2010 [facility] Patient Information 2012 [place], [place].

## 2011-07-08 NOTE — ED Notes (Signed)
Laceration to his right hand on a piece of metal. Bleeding controlled.

## 2011-07-08 NOTE — ED Provider Notes (Signed)
History     CSN: [id number]  Arrival date & time 07/08/11  1653   First MD Initiated Contact with Patient 07/08/11 1702      Chief Complaint  Patient presents with  . Extremity Laceration    (Consider location/radiation/quality/duration/timing/severity/associated sxs/prior treatment) HPI History from patient. 31 year old male who presents with laceration to the right hand. He states that he was at work earlier today when he lacerated the hand at the lateral base of the fifth finger on a piece of sheet metal. Bleeding was well controlled. He denies any distal numbness, weakness, difficulty moving the fifth finger. Tetanus status unknown.  Past Medical History  Diagnosis Date  . High cholesterol   . Difficult intubation   . Kidney calculi   . Kidney stones     Past Surgical History  Procedure Date  . Lithotripsy   . Cystoscopy w/ ureteral stent placement 12/18/2010    Procedure: CYSTOSCOPY WITH RETROGRADE PYELOGRAM/URETERAL STENT PLACEMENT;  Surgeon: Lindaann Slough, MD;  Location: [facility];  Service: Urology;  Laterality: Left;  left ureteroscopy    No family history on file.  History  Substance Use Topics  . Smoking status: Current Everyday Smoker -- 1.0 packs/day    Types: Cigarettes  . Smokeless tobacco: Not on file  . Alcohol Use: Yes     occasionally      Review of Systems as per HPI  Allergies  Review of patient's allergies indicates no known allergies.  Home Medications   Current Outpatient Rx  Name Route Sig Dispense Refill  . OMEPRAZOLE 10 MG PO CPDR Oral Take 10 mg by mouth daily. Patient uses this medication prn.    . ALBUTEROL SULFATE HFA 108 (90 BASE) MCG/ACT IN AERS Inhalation Inhale 2 puffs into the lungs every 2 (two) hours as needed for wheezing or shortness of breath (cough). 1 Inhaler 0    BP 144/92  Pulse 85  Temp(Src) 98.5 F (36.9 C) (Oral)  Resp 20  SpO2 100%  Physical Exam  Nursing note and vitals reviewed. Constitutional: He  appears well-developed and well-nourished. No distress.  HENT:  Head: Normocephalic and atraumatic.  Neck: Normal range of motion.  Cardiovascular: Normal rate.   Pulmonary/Chest: Effort normal.  Musculoskeletal: Normal range of motion.       Hands:      Transverse linear laceration just inferior to 5th finger, with small flap. Wound explored, no exposed tendon, although there is a small, intact vessel visible. Pt with full ROM of finger and strength at MCP, IP jts. NVI distally with sensory intact to lt touch; cap refill <3.  Neurological: He is alert.  Skin: Skin is warm and dry. He is not diaphoretic.  Psychiatric: He has a normal mood and affect.    ED Course  Procedures (including critical care time)  LACERATION REPAIR Performed by: Grant Fontana Authorized by: Grant Fontana Consent: Verbal consent obtained. Risks and benefits: risks, benefits and alternatives were discussed Consent given by: patient Patient identity confirmed: provided demographic data Prepped and Draped in normal sterile fashion Wound explored  Laceration Location: R lat small finger  Laceration Length: 2cm  No Foreign Bodies seen or palpated  Anesthesia: local infiltration  Local anesthetic: lidocaine 1% without epinephrine  Anesthetic total: 4 ml  Irrigation method: syringe Amount of cleaning: standard  Skin closure: 6-0 Prolene  Number of sutures: 8  Technique: simple interrupted  Patient tolerance: Patient tolerated the procedure well with no immediate complications.   Labs Reviewed - No data  to display Dg Finger Little Right  07/08/2011  *RADIOLOGY REPORT*  Clinical Data: Laceration lateral base of small finger.  RIGHT LITTLE FINGER 2+V  Comparison: None.  Findings: Soft tissue defect is noted ulnar to the metacarpal phalangeal joint consistent with a laceration.  No foreign body is identified.  There is no evidence of acute fracture or dislocation.  IMPRESSION: Soft tissue  injury consistent with laceration.  No evidence of foreign body or osseous injury.  Original Report Authenticated By: Gerrianne Scale, M.D.     1. Laceration of right hand       MDM  Pt with laceration just below base of lat 5th R finger; bleeding well controlled; wound clean. There does appear to be an exposed vessel which is intact without bleeding. Pt with FROM and strength to finger; no evidence of tendon injury. Xrays which I personally reviewed negative for fx. Wound was carefully repaired without complication. Tet updated. Wound care and suture removal discussed.  Case d/w Dr. Alto Denver who examined wound with me prior to closure.        Grant Fontana, [place] 07/08/11 2336

## 2011-07-12 NOTE — ED Provider Notes (Signed)
Medical screening examination/treatment/procedure(s) were performed by non-physician practitioner and as supervising physician I was immediately available for consultation/collaboration.  Jamisen Hawes, MD 07/12/11 1223 

## 2011-12-11 ENCOUNTER — Emergency Department (HOSPITAL_BASED_OUTPATIENT_CLINIC_OR_DEPARTMENT_OTHER)
Admission: EM | Admit: 2011-12-11 | Discharge: 2011-12-11 | Disposition: A | Discharge status: Home / Self Care | Payer: Self-pay | Attending: Emergency Medicine | Admitting: Emergency Medicine

## 2011-12-11 ENCOUNTER — Emergency Department (HOSPITAL_BASED_OUTPATIENT_CLINIC_OR_DEPARTMENT_OTHER): Payer: Self-pay

## 2011-12-11 ENCOUNTER — Encounter (HOSPITAL_BASED_OUTPATIENT_CLINIC_OR_DEPARTMENT_OTHER): Payer: Self-pay | Admitting: *Deleted

## 2011-12-11 DIAGNOSIS — Z87442 Personal history of urinary calculi: Secondary | ICD-10-CM | POA: Insufficient documentation

## 2011-12-11 DIAGNOSIS — F17200 Nicotine dependence, unspecified, uncomplicated: Secondary | ICD-10-CM | POA: Insufficient documentation

## 2011-12-11 DIAGNOSIS — Z79899 Other long term (current) drug therapy: Secondary | ICD-10-CM | POA: Insufficient documentation

## 2011-12-11 DIAGNOSIS — N200 Calculus of kidney: Secondary | ICD-10-CM | POA: Insufficient documentation

## 2011-12-11 DIAGNOSIS — E7800 Pure hypercholesterolemia, unspecified: Secondary | ICD-10-CM | POA: Insufficient documentation

## 2011-12-11 LAB — URINALYSIS, ROUTINE W REFLEX MICROSCOPIC
Glucose, UA: NEGATIVE mg/dL
Hgb urine dipstick: NEGATIVE
Ketones, ur: NEGATIVE mg/dL
Leukocytes, UA: NEGATIVE
Protein, ur: NEGATIVE mg/dL
Urobilinogen, UA: 1 mg/dL (ref 0.0–1.0)

## 2011-12-11 MED ORDER — KETOROLAC TROMETHAMINE 60 MG/2ML IM SOLN
60.0000 mg | Freq: Once | INTRAMUSCULAR | Status: AC
  Administered 2011-12-11: 60 mg via INTRAMUSCULAR
  Filled 2011-12-11: qty 2

## 2011-12-11 MED ORDER — HYDROCODONE-ACETAMINOPHEN 5-500 MG PO TABS: 1.0000 | ORAL_TABLET | Freq: Four times a day (QID) | ORAL | Status: DC | PRN

## 2011-12-11 NOTE — ED Provider Notes (Signed)
History    This chart was scribed for Geoffery Lyons, MD, MD by Smitty Pluck, ED Scribe. The patient was seen in room MH05 and the patient's care was started at 8:09PM.   CSN: [id number]  Arrival date & time 12/11/11  1836      Chief Complaint  Patient presents with  . Flank Pain     The history is provided by the patient. No language interpreter was used.   Frank Robinson is a 31 y.o. male who presents to the Emergency Department complaining of constant, moderate left flank pain onset 5 days ago. Symptoms gradually worsened since onset. Pt reports hx of kidney stones removed by surgery. Pain feels like pressure. He reports passing mass in his urine 1 day ago. Denies radiation, hematuria, fever, chills, nausea, diarrhea, headache and any other pain.   Past Medical History  Diagnosis Date  . High cholesterol   . Difficult intubation   . Kidney calculi   . Kidney stones     Past Surgical History  Procedure Date  . Lithotripsy   . Cystoscopy w/ ureteral stent placement 12/18/2010    Procedure: CYSTOSCOPY WITH RETROGRADE PYELOGRAM/URETERAL STENT PLACEMENT;  Surgeon: Lindaann Slough, MD;  Location: [facility];  Service: Urology;  Laterality: Left;  left ureteroscopy    No family history on file.  History  Substance Use Topics  . Smoking status: Current Every Day Smoker -- 1.0 packs/day    Types: Cigarettes  . Smokeless tobacco: Never Used  . Alcohol Use: 3.6 oz/week    6 Cans of beer per week     Comment: occasionally      Review of Systems  All other systems reviewed and are negative.  10 Systems reviewed and all are negative for acute change except as noted in the HPI.   Allergies  Review of patient's allergies indicates no known allergies.  Home Medications   Current Outpatient Rx  Name  Route  Sig  Dispense  Refill  . OMEPRAZOLE 10 MG PO CPDR   Oral   Take 10 mg by mouth daily. Patient uses this medication prn.         . ALBUTEROL SULFATE HFA 108 (90  BASE) MCG/ACT IN AERS   Inhalation   Inhale 2 puffs into the lungs every 2 (two) hours as needed for wheezing or shortness of breath (cough).   1 Inhaler   0     BP 134/73  Pulse 65  Temp 98.6 F (37 C) (Oral)  Resp 18  Ht 6' (1.829 m)  Wt 260 lb (117.935 kg)  BMI 35.26 kg/m2  SpO2 97%  Physical Exam  Nursing note and vitals reviewed. Constitutional: He is oriented to person, place, and time. He appears well-developed and well-nourished. No distress.  HENT:  Head: Normocephalic and atraumatic.  Eyes: EOM are normal. Pupils are equal, round, and reactive to light.  Neck: Normal range of motion. Neck supple. No tracheal deviation present.  Cardiovascular: Normal rate, regular rhythm and normal heart sounds.   Pulmonary/Chest: Effort normal and breath sounds normal. No respiratory distress.  Abdominal: Soft. Bowel sounds are normal. He exhibits no distension. There is tenderness (mild) in the left lower quadrant. There is no rebound and no guarding.  Musculoskeletal: Normal range of motion.  Neurological: He is alert and oriented to person, place, and time.  Skin: Skin is warm and dry.  Psychiatric: He has a normal mood and affect. His behavior is normal.    ED Course  Procedures (  including critical care time) DIAGNOSTIC STUDIES: Oxygen Saturation is 97% on room air, normal by my interpretation.    COORDINATION OF CARE: 8:11 PM Discussed ED treatment with pt  8:16 PM Ordered:    . ketorolac  60 mg Intramuscular Once       Labs Reviewed  URINALYSIS, ROUTINE W REFLEX MICROSCOPIC - Abnormal; Notable for the following:    APPearance CLOUDY (*)     All other components within normal limits   No results found.   No diagnosis found.    MDM  The patient presents with left flank pain that sounds like a kidney stone.  He has a history of the same.  The ct is negative and the urine is clear.  Will discharge with pain meds, give time.        I personally performed  the services described in this documentation, which was scribed in my presence. The recorded information has been reviewed and is accurate.      Geoffery Lyons, MD 12/11/11 2118

## 2011-12-11 NOTE — ED Notes (Signed)
Left flank pain and LLQ pain x 5 days- c/o pressure in right flank with urination- hx of kidney stones

## 2012-01-04 ENCOUNTER — Emergency Department (HOSPITAL_BASED_OUTPATIENT_CLINIC_OR_DEPARTMENT_OTHER)
Admission: EM | Admit: 2012-01-04 | Discharge: 2012-01-04 | Disposition: A | Discharge status: Home / Self Care | Payer: Self-pay | Attending: Emergency Medicine | Admitting: Emergency Medicine

## 2012-01-04 ENCOUNTER — Encounter (HOSPITAL_BASED_OUTPATIENT_CLINIC_OR_DEPARTMENT_OTHER): Payer: Self-pay | Admitting: Emergency Medicine

## 2012-01-04 DIAGNOSIS — G43909 Migraine, unspecified, not intractable, without status migrainosus: Secondary | ICD-10-CM | POA: Insufficient documentation

## 2012-01-04 DIAGNOSIS — K219 Gastro-esophageal reflux disease without esophagitis: Secondary | ICD-10-CM | POA: Insufficient documentation

## 2012-01-04 DIAGNOSIS — H539 Unspecified visual disturbance: Secondary | ICD-10-CM | POA: Insufficient documentation

## 2012-01-04 DIAGNOSIS — E669 Obesity, unspecified: Secondary | ICD-10-CM | POA: Insufficient documentation

## 2012-01-04 DIAGNOSIS — J45909 Unspecified asthma, uncomplicated: Secondary | ICD-10-CM | POA: Insufficient documentation

## 2012-01-04 DIAGNOSIS — F17200 Nicotine dependence, unspecified, uncomplicated: Secondary | ICD-10-CM | POA: Insufficient documentation

## 2012-01-04 DIAGNOSIS — E7800 Pure hypercholesterolemia, unspecified: Secondary | ICD-10-CM | POA: Insufficient documentation

## 2012-01-04 DIAGNOSIS — Z87442 Personal history of urinary calculi: Secondary | ICD-10-CM | POA: Insufficient documentation

## 2012-01-04 HISTORY — DX: Unspecified asthma, uncomplicated: J45.909

## 2012-01-04 HISTORY — DX: Gastro-esophageal reflux disease without esophagitis: K21.9

## 2012-01-04 MED ORDER — SODIUM CHLORIDE 0.9 % IV SOLN
Freq: Once | INTRAVENOUS | Status: AC
  Administered 2012-01-04: 10:00:00 via INTRAVENOUS

## 2012-01-04 MED ORDER — DEXAMETHASONE SODIUM PHOSPHATE 10 MG/ML IJ SOLN
10.0000 mg | Freq: Once | INTRAMUSCULAR | Status: AC
  Administered 2012-01-04: 10 mg via INTRAVENOUS
  Filled 2012-01-04: qty 1

## 2012-01-04 MED ORDER — DIPHENHYDRAMINE HCL 50 MG/ML IJ SOLN
25.0000 mg | Freq: Once | INTRAMUSCULAR | Status: AC
  Administered 2012-01-04: 25 mg via INTRAVENOUS
  Filled 2012-01-04: qty 1

## 2012-01-04 MED ORDER — SODIUM CHLORIDE 0.9 % IV BOLUS (SEPSIS)
1000.0000 mL | Freq: Once | INTRAVENOUS | Status: AC
  Administered 2012-01-04: 1000 mL via INTRAVENOUS

## 2012-01-04 MED ORDER — METOCLOPRAMIDE HCL 5 MG/ML IJ SOLN
10.0000 mg | Freq: Once | INTRAMUSCULAR | Status: AC
  Administered 2012-01-04: 10 mg via INTRAVENOUS
  Filled 2012-01-04: qty 2

## 2012-01-04 MED ORDER — IBUPROFEN 800 MG PO TABS: 800.0000 mg | ORAL_TABLET | Freq: Three times a day (TID) | ORAL | Status: DC

## 2012-01-04 NOTE — ED Notes (Signed)
MD at bedside. 

## 2012-01-04 NOTE — ED Notes (Signed)
C/o intermittent H/A x one month.  Intermittent N/V x 2 weeks, usually in the morning. Vomited 6-7 times in the last 24 hours.  States when he coughs, he "blacks out and can't see anything".  C/o blurred vision at night while watching TV.  Denies fever.

## 2012-01-04 NOTE — ED Provider Notes (Signed)
History     CSN: [id number]  Arrival date & time 01/04/12  0905   None     Chief Complaint  Patient presents with  . Emesis  . Headache    (Consider location/radiation/quality/duration/timing/severity/associated sxs/prior treatment) Patient is a 31 y.o. male presenting with headaches. The history is provided by the patient and medical records. No language interpreter was used.  Headache  This is a recurrent problem. Episode onset: The patient is a 31 year old man has had intermittent headaches for a month. He feels the headache in the occipital region. Episode frequency: Headache usually is present in the morning. He will have vomiting and then there will be some relief of the headache. Today the headache was quite severe for vomiting and blurred vision. He therefore sought evaluation. The problem has been gradually worsening. The headache is associated with nothing. The pain is located in the occipital region. The quality of the pain is described as throbbing. The pain is at a severity of 5/10. The pain is moderate. The pain does not radiate. Associated symptoms include nausea and vomiting. Pertinent negatives include no fever. He has tried acetaminophen for the symptoms. The treatment provided mild relief.    Past Medical History  Diagnosis Date  . High cholesterol   . Difficult intubation   . Kidney calculi   . Kidney stones   . GERD (gastroesophageal reflux disease)   . Asthma     Past Surgical History  Procedure Date  . Lithotripsy   . Cystoscopy w/ ureteral stent placement 12/18/2010    Procedure: CYSTOSCOPY WITH RETROGRADE PYELOGRAM/URETERAL STENT PLACEMENT;  Surgeon: Lindaann Slough, MD;  Location: [facility];  Service: Urology;  Laterality: Left;  left ureteroscopy    No family history on file.  History  Substance Use Topics  . Smoking status: Current Every Day Smoker -- 1.0 packs/day    Types: Cigarettes  . Smokeless tobacco: Never Used  . Alcohol Use: 3.6 oz/week     6 Cans of beer per week     Comment: occasionally      Review of Systems  Constitutional: Negative for fever and chills.  Eyes: Positive for visual disturbance.  Respiratory: Negative.   Cardiovascular: Negative.   Gastrointestinal: Positive for nausea and vomiting.  Genitourinary: Negative.   Musculoskeletal: Negative.   Skin: Negative.   Neurological: Positive for headaches.  Psychiatric/Behavioral: Negative.     Allergies  Review of patient's allergies indicates no known allergies.  Home Medications   Current Outpatient Rx  Name  Route  Sig  Dispense  Refill  . OMEPRAZOLE 10 MG PO CPDR   Oral   Take 10 mg by mouth daily. Patient uses this medication prn.         . ALBUTEROL SULFATE HFA 108 (90 BASE) MCG/ACT IN AERS   Inhalation   Inhale 2 puffs into the lungs every 2 (two) hours as needed for wheezing or shortness of breath (cough).   1 Inhaler   0   . HYDROCODONE-ACETAMINOPHEN 5-500 MG PO TABS   Oral   Take 1-2 tablets by mouth every 6 (six) hours as needed for pain.   10 tablet   0     BP 143/79  Pulse 58  Temp 97.5 F (36.4 C) (Oral)  Resp 16  Wt 260 lb (117.935 kg)  SpO2 99%  Physical Exam  Nursing note and vitals reviewed. Constitutional: He is oriented to person, place, and time.       Obese young man in moderate  distress with occipital headache.  HENT:  Head: Normocephalic and atraumatic.  Right Ear: External ear normal.  Left Ear: External ear normal.  Mouth/Throat: Oropharynx is clear and moist.  Eyes: Conjunctivae normal and EOM are normal. Pupils are equal, round, and reactive to light. No scleral icterus.  Neck: Normal range of motion. Neck supple.  Cardiovascular: Normal rate, regular rhythm and normal heart sounds.   Pulmonary/Chest: Effort normal and breath sounds normal.  Abdominal: Soft. Bowel sounds are normal.  Musculoskeletal: Normal range of motion. He exhibits no edema and no tenderness.  Neurological: He is alert and  oriented to person, place, and time.       No sensory or motor deficit.  Skin: Skin is warm and dry.  Psychiatric: He has a normal mood and affect. His behavior is normal.    ED Course  Procedures (including critical care time)   9:49 AM Patient was seen and had physical examination. He was prescribed dexamethasone, Reglan, and Benadryl, and was given a liter of normal saline.  12:04 PM Pt's nausea has eased and headache is somewhat better. He cannot afford a triptan for migraine, so prescribed ibuprofen 800 mg tid.  1. Migraine headache        Carleene Cooper III, MD 01/04/12 [phone]

## 2012-02-21 ENCOUNTER — Emergency Department (HOSPITAL_BASED_OUTPATIENT_CLINIC_OR_DEPARTMENT_OTHER)
Admission: EM | Admit: 2012-02-21 | Discharge: 2012-02-21 | Disposition: A | Discharge status: Home / Self Care | Payer: Self-pay | Attending: Emergency Medicine | Admitting: Emergency Medicine

## 2012-02-21 ENCOUNTER — Encounter (HOSPITAL_BASED_OUTPATIENT_CLINIC_OR_DEPARTMENT_OTHER): Payer: Self-pay | Admitting: Emergency Medicine

## 2012-02-21 DIAGNOSIS — J45909 Unspecified asthma, uncomplicated: Secondary | ICD-10-CM | POA: Insufficient documentation

## 2012-02-21 DIAGNOSIS — Z8719 Personal history of other diseases of the digestive system: Secondary | ICD-10-CM | POA: Insufficient documentation

## 2012-02-21 DIAGNOSIS — K5289 Other specified noninfective gastroenteritis and colitis: Secondary | ICD-10-CM | POA: Insufficient documentation

## 2012-02-21 DIAGNOSIS — F17200 Nicotine dependence, unspecified, uncomplicated: Secondary | ICD-10-CM | POA: Insufficient documentation

## 2012-02-21 DIAGNOSIS — Z87442 Personal history of urinary calculi: Secondary | ICD-10-CM | POA: Insufficient documentation

## 2012-02-21 DIAGNOSIS — E7800 Pure hypercholesterolemia, unspecified: Secondary | ICD-10-CM | POA: Insufficient documentation

## 2012-02-21 DIAGNOSIS — IMO0002 Reserved for concepts with insufficient information to code with codable children: Secondary | ICD-10-CM | POA: Insufficient documentation

## 2012-02-21 DIAGNOSIS — K529 Noninfective gastroenteritis and colitis, unspecified: Secondary | ICD-10-CM

## 2012-02-21 MED ORDER — ONDANSETRON HCL 4 MG/2ML IJ SOLN
4.0000 mg | Freq: Once | INTRAMUSCULAR | Status: AC
  Administered 2012-02-21: 4 mg via INTRAVENOUS
  Filled 2012-02-21: qty 2

## 2012-02-21 MED ORDER — ONDANSETRON HCL 8 MG PO TABS: 8.0000 mg | ORAL_TABLET | Freq: Three times a day (TID) | ORAL | Status: DC | PRN

## 2012-02-21 MED ORDER — FENTANYL CITRATE 0.05 MG/ML IJ SOLN
100.0000 ug | Freq: Once | INTRAMUSCULAR | Status: AC
  Administered 2012-02-21: 100 ug via INTRAVENOUS
  Filled 2012-02-21: qty 2

## 2012-02-21 MED ORDER — SODIUM CHLORIDE 0.9 % IV BOLUS (SEPSIS)
2000.0000 mL | Freq: Once | INTRAVENOUS | Status: AC
  Administered 2012-02-21: 2000 mL via INTRAVENOUS

## 2012-02-21 NOTE — ED Notes (Signed)
Vomiting  Fever and chills   States wife was here with same yesterday

## 2012-02-21 NOTE — ED Provider Notes (Signed)
History     CSN: [id number]  Arrival date & time 02/21/12  0011   First MD Initiated Contact with Patient 02/21/12 [phone]      Chief Complaint  Patient presents with  . Vomiting     (Consider location/radiation/quality/duration/timing/severity/associated sxs/prior treatment) HPI Is a 32 year old male with a two-day history of headache, malaise and body aches. He began vomiting yesterday evening and has had multiple episodes of vomiting since. He was retching in triage on arrival. He has vomited to the point of having a dry mouth and a rapid heart rate. He states he has had some minor cough and nasal congestion but nothing significant. He had one episode of diarrhea. He's had some epigastric discomfort as a result of vomiting. His wife was seen about 24 hours ago for similar symptoms. There's been no specific mitigating or exacerbating factor except attempting to eat or drink results in vomiting.   Past Medical History  Diagnosis Date  . High cholesterol   . Difficult intubation   . Kidney calculi   . Kidney stones   . GERD (gastroesophageal reflux disease)   . Asthma     Past Surgical History  Procedure Date  . Lithotripsy   . Cystoscopy w/ ureteral stent placement 12/18/2010    Procedure: CYSTOSCOPY WITH RETROGRADE PYELOGRAM/URETERAL STENT PLACEMENT;  Surgeon: Lindaann Slough, MD;  Location: [facility];  Service: Urology;  Laterality: Left;  left ureteroscopy    No family history on file.  History  Substance Use Topics  . Smoking status: Current Every Day Smoker -- 1.0 packs/day    Types: Cigarettes  . Smokeless tobacco: Never Used  . Alcohol Use: 3.6 oz/week    6 Cans of beer per week     Comment: occasionally      Review of Systems  All other systems reviewed and are negative.    Allergies  Review of patient's allergies indicates no known allergies.  Home Medications   Current Outpatient Rx  Name  Route  Sig  Dispense  Refill  . ALBUTEROL SULFATE HFA 108 (90  BASE) MCG/ACT IN AERS   Inhalation   Inhale 2 puffs into the lungs every 2 (two) hours as needed for wheezing or shortness of breath (cough).   1 Inhaler   0   . HYDROCODONE-ACETAMINOPHEN 5-500 MG PO TABS   Oral   Take 1-2 tablets by mouth every 6 (six) hours as needed for pain.   10 tablet   0   . IBUPROFEN 800 MG PO TABS   Oral   Take 1 tablet (800 mg total) by mouth 3 (three) times daily.   21 tablet   0   . OMEPRAZOLE 10 MG PO CPDR   Oral   Take 10 mg by mouth daily. Patient uses this medication prn.           BP 135/99  Pulse 144  Temp 98.4 F (36.9 C) (Oral)  Resp 20  SpO2 98%  Physical Exam General: Well-developed, well-nourished male in no acute distress; appearance consistent with age of record HENT: normocephalic, atraumatic; mucous membranes dry Eyes: pupils equal round and reactive to light; extraocular muscles intact Neck: supple Heart: regular rate and rhythm; tachycardia Lungs: clear to auscultation bilaterally Abdomen: soft; nondistended; mild epigastric tenderness; bowel sounds present Extremities: No deformity; full range of motion; pulses normal Neurologic: Awake, alert and oriented; motor function intact in all extremities and symmetric; no facial droop Skin: Warm and dry    ED Course  Procedures (  including critical care time)     MDM  2:13 AM Patient feels better after 2 L normal saline all his. History fluids without emesis.        Hanley Seamen, MD 02/21/12 [phone]

## 2012-04-07 ENCOUNTER — Encounter (HOSPITAL_BASED_OUTPATIENT_CLINIC_OR_DEPARTMENT_OTHER): Payer: Self-pay | Admitting: *Deleted

## 2012-04-07 ENCOUNTER — Emergency Department (HOSPITAL_BASED_OUTPATIENT_CLINIC_OR_DEPARTMENT_OTHER)
Admission: EM | Admit: 2012-04-07 | Discharge: 2012-04-07 | Disposition: A | Discharge status: Home / Self Care | Payer: Self-pay | Attending: Emergency Medicine | Admitting: Emergency Medicine

## 2012-04-07 DIAGNOSIS — Z789 Other specified health status: Secondary | ICD-10-CM | POA: Insufficient documentation

## 2012-04-07 DIAGNOSIS — Y929 Unspecified place or not applicable: Secondary | ICD-10-CM | POA: Insufficient documentation

## 2012-04-07 DIAGNOSIS — L259 Unspecified contact dermatitis, unspecified cause: Secondary | ICD-10-CM | POA: Insufficient documentation

## 2012-04-07 DIAGNOSIS — Z79899 Other long term (current) drug therapy: Secondary | ICD-10-CM | POA: Insufficient documentation

## 2012-04-07 DIAGNOSIS — Z87442 Personal history of urinary calculi: Secondary | ICD-10-CM | POA: Insufficient documentation

## 2012-04-07 DIAGNOSIS — J45909 Unspecified asthma, uncomplicated: Secondary | ICD-10-CM | POA: Insufficient documentation

## 2012-04-07 DIAGNOSIS — K219 Gastro-esophageal reflux disease without esophagitis: Secondary | ICD-10-CM | POA: Insufficient documentation

## 2012-04-07 DIAGNOSIS — Z4802 Encounter for removal of sutures: Secondary | ICD-10-CM | POA: Insufficient documentation

## 2012-04-07 DIAGNOSIS — Z862 Personal history of diseases of the blood and blood-forming organs and certain disorders involving the immune mechanism: Secondary | ICD-10-CM | POA: Insufficient documentation

## 2012-04-07 DIAGNOSIS — Z8639 Personal history of other endocrine, nutritional and metabolic disease: Secondary | ICD-10-CM | POA: Insufficient documentation

## 2012-04-07 DIAGNOSIS — S39012A Strain of muscle, fascia and tendon of lower back, initial encounter: Secondary | ICD-10-CM

## 2012-04-07 DIAGNOSIS — X58XXXA Exposure to other specified factors, initial encounter: Secondary | ICD-10-CM | POA: Insufficient documentation

## 2012-04-07 DIAGNOSIS — S335XXA Sprain of ligaments of lumbar spine, initial encounter: Secondary | ICD-10-CM | POA: Insufficient documentation

## 2012-04-07 DIAGNOSIS — F17200 Nicotine dependence, unspecified, uncomplicated: Secondary | ICD-10-CM | POA: Insufficient documentation

## 2012-04-07 DIAGNOSIS — Y939 Activity, unspecified: Secondary | ICD-10-CM | POA: Insufficient documentation

## 2012-04-07 DIAGNOSIS — L309 Dermatitis, unspecified: Secondary | ICD-10-CM

## 2012-04-07 NOTE — ED Notes (Addendum)
Pt reports he cleaned out his navel several days ago and now has "blood and pus" coming from it- also states he "knot" upper abdomen that burns- also c/o back pain since starting at the gym recently

## 2012-04-07 NOTE — ED Provider Notes (Signed)
History  This chart was scribed for Hilario Quarry, MD, by Candelaria Stagers, ED Scribe. This patient was seen in room MH07/MH07 and the patient's care was started at 5:53 PM   CSN: [id number]  Arrival date & time 04/07/12  1439   First MD Initiated Contact with Patient 04/07/12 1747      Chief Complaint  Patient presents with  . Wound Check  . Back Pain     The history is provided by the patient. No language interpreter was used.   Frank Robinson is a 32 y.o. male who presents to the Emergency Department complaining of a scab and discharge to the umbilicus that the pt noticed five days ago.  He has cleaned the area with peroxide.  Pt is also experiencing constant lower back pain that started three days ago and is worse in the mornings.  He states that the pain is radiating down his left leg.  He has taken nothing for the pain.  He denies any recent trauma or injury.  He denies numbness, tingling, or loss of bowel or bladder control.  Pt smokes.     Past Medical History  Diagnosis Date  . High cholesterol   . Difficult intubation   . Kidney calculi   . Kidney stones   . GERD (gastroesophageal reflux disease)   . Asthma     Past Surgical History  Procedure Laterality Date  . Lithotripsy    . Cystoscopy w/ ureteral stent placement  12/18/2010    Procedure: CYSTOSCOPY WITH RETROGRADE PYELOGRAM/URETERAL STENT PLACEMENT;  Surgeon: Lindaann Slough, MD;  Location: [facility];  Service: Urology;  Laterality: Left;  left ureteroscopy    No family history on file.  History  Substance Use Topics  . Smoking status: Current Every Day Smoker -- 1.00 packs/day    Types: Cigarettes  . Smokeless tobacco: Never Used  . Alcohol Use: 3.6 oz/week    6 Cans of beer per week     Comment: occasionally      Review of Systems  Musculoskeletal: Positive for back pain (lower back pain). Negative for gait problem.  Skin: Positive for wound (irritation and scabbing to umbilicus ).  All other  systems reviewed and are negative.    Allergies  Review of patient's allergies indicates no known allergies.  Home Medications   Current Outpatient Rx  Name  Route  Sig  Dispense  Refill  . omeprazole (PRILOSEC) 10 MG capsule   Oral   Take 10 mg by mouth daily. Patient uses this medication prn.         Marland Kitchen EXPIRED: albuterol (PROVENTIL HFA;VENTOLIN HFA) 108 (90 BASE) MCG/ACT inhaler   Inhalation   Inhale 2 puffs into the lungs every 2 (two) hours as needed for wheezing or shortness of breath (cough).   1 Inhaler   0   . HYDROcodone-acetaminophen (VICODIN) 5-500 MG per tablet   Oral   Take 1-2 tablets by mouth every 6 (six) hours as needed for pain.   10 tablet   0   . ibuprofen (ADVIL,MOTRIN) 800 MG tablet   Oral   Take 1 tablet (800 mg total) by mouth 3 (three) times daily.   21 tablet   0   . ondansetron (ZOFRAN) 8 MG tablet   Oral   Take 1 tablet (8 mg total) by mouth every 8 (eight) hours as needed for nausea.   5 tablet   0     BP 153/84  Pulse 78  Temp(Src) 98.8  F (37.1 C) (Oral)  Resp 18  Ht 6' (1.829 m)  Wt 280 lb (127.007 kg)  BMI 37.97 kg/m2  SpO2 99%  Physical Exam  Nursing note and vitals reviewed. Constitutional: He is oriented to person, place, and time. He appears well-developed and well-nourished. No distress.  HENT:  Head: Normocephalic and atraumatic.  Eyes: EOM are normal.  Neck: Neck supple. No tracheal deviation present.  Cardiovascular: Normal rate.   Pulmonary/Chest: Effort normal. No respiratory distress.  Abdominal:  Umbilicus is red and irritated, no surrounding infection.   Musculoskeletal: Normal range of motion. He exhibits no tenderness.  No lower back tenderness.  No step offs, no visual abnormality of back.  Strength 5/5 of bilateral lower extremities.  Normal ROM of all extremities.  Normal gait.    Neurological: He is alert and oriented to person, place, and time.  Skin: Skin is warm and dry.  Psychiatric: He has a  normal mood and affect. His behavior is normal.    ED Course  Procedures   DIAGNOSTIC STUDIES: Oxygen Saturation is 99% on room air, normal by my interpretation.    COORDINATION OF CARE:  5:55 PM Discussed course of care with pt which includes keeping the area clean and dry.  Will advise antiinflammatory for lower back pain.  Pt understands and agrees.    Labs Reviewed - No data to display No results found.   No diagnosis found.    MDM  I personally performed the services described in this documentation, which was scribed in my presence. The recorded information has been reviewed and considered.        Hilario Quarry, MD 04/08/12 [phone]

## 2012-07-15 ENCOUNTER — Encounter (HOSPITAL_BASED_OUTPATIENT_CLINIC_OR_DEPARTMENT_OTHER): Payer: Self-pay | Admitting: *Deleted

## 2012-07-15 ENCOUNTER — Emergency Department (HOSPITAL_BASED_OUTPATIENT_CLINIC_OR_DEPARTMENT_OTHER)
Admission: EM | Admit: 2012-07-15 | Discharge: 2012-07-15 | Disposition: A | Discharge status: Home / Self Care | Payer: Self-pay | Attending: Emergency Medicine | Admitting: Emergency Medicine

## 2012-07-15 ENCOUNTER — Emergency Department (HOSPITAL_BASED_OUTPATIENT_CLINIC_OR_DEPARTMENT_OTHER): Payer: Self-pay

## 2012-07-15 DIAGNOSIS — Z789 Other specified health status: Secondary | ICD-10-CM | POA: Insufficient documentation

## 2012-07-15 DIAGNOSIS — Z8639 Personal history of other endocrine, nutritional and metabolic disease: Secondary | ICD-10-CM | POA: Insufficient documentation

## 2012-07-15 DIAGNOSIS — K219 Gastro-esophageal reflux disease without esophagitis: Secondary | ICD-10-CM | POA: Insufficient documentation

## 2012-07-15 DIAGNOSIS — R109 Unspecified abdominal pain: Secondary | ICD-10-CM | POA: Insufficient documentation

## 2012-07-15 DIAGNOSIS — Z79899 Other long term (current) drug therapy: Secondary | ICD-10-CM | POA: Insufficient documentation

## 2012-07-15 DIAGNOSIS — R110 Nausea: Secondary | ICD-10-CM | POA: Insufficient documentation

## 2012-07-15 DIAGNOSIS — J45909 Unspecified asthma, uncomplicated: Secondary | ICD-10-CM | POA: Insufficient documentation

## 2012-07-15 DIAGNOSIS — F17200 Nicotine dependence, unspecified, uncomplicated: Secondary | ICD-10-CM | POA: Insufficient documentation

## 2012-07-15 DIAGNOSIS — Z87442 Personal history of urinary calculi: Secondary | ICD-10-CM | POA: Insufficient documentation

## 2012-07-15 DIAGNOSIS — Z862 Personal history of diseases of the blood and blood-forming organs and certain disorders involving the immune mechanism: Secondary | ICD-10-CM | POA: Insufficient documentation

## 2012-07-15 DIAGNOSIS — R300 Dysuria: Secondary | ICD-10-CM | POA: Insufficient documentation

## 2012-07-15 LAB — BASIC METABOLIC PANEL
BUN: 17 mg/dL (ref 6–23)
Calcium: 9.6 mg/dL (ref 8.4–10.5)
Creatinine, Ser: 1.2 mg/dL (ref 0.50–1.35)
GFR calc Af Amer: >90 mL/min (ref >90)
GFR calc non Af Amer: 79 mL/min — ABNORMAL LOW (ref >90)

## 2012-07-15 LAB — CBC WITH DIFFERENTIAL/PLATELET
Basophils Absolute: 0 10*3/uL (ref 0.0–0.1)
Basophils Relative: 1 % (ref 0–1)
Eosinophils Absolute: 0.2 10*3/uL (ref 0.0–0.7)
Eosinophils Relative: 4 % (ref 0–5)
HCT: 44.1 % (ref 39.0–52.0)
Hemoglobin: 14.9 g/dL (ref 13.0–17.0)
MCH: 30.2 pg (ref 26.0–34.0)
MCHC: 33.8 g/dL (ref 30.0–36.0)
Monocytes Absolute: 0.5 10*3/uL (ref 0.1–1.0)
Monocytes Relative: 11 % (ref 3–12)
RDW: 14.4 % (ref 11.5–15.5)

## 2012-07-15 LAB — URINALYSIS, ROUTINE W REFLEX MICROSCOPIC
Bilirubin Urine: NEGATIVE
Glucose, UA: NEGATIVE mg/dL
Ketones, ur: NEGATIVE mg/dL
Protein, ur: NEGATIVE mg/dL
pH: 6 (ref 5.0–8.0)

## 2012-07-15 MED ORDER — CYCLOBENZAPRINE HCL 10 MG PO TABS: 10.0000 mg | ORAL_TABLET | Freq: Two times a day (BID) | ORAL | Status: DC | PRN

## 2012-07-15 MED ORDER — ONDANSETRON HCL 4 MG/2ML IJ SOLN
4.0000 mg | Freq: Once | INTRAMUSCULAR | Status: AC
  Administered 2012-07-15: 4 mg via INTRAVENOUS
  Filled 2012-07-15: qty 2

## 2012-07-15 MED ORDER — KETOROLAC TROMETHAMINE 30 MG/ML IJ SOLN
30.0000 mg | Freq: Once | INTRAMUSCULAR | Status: AC
  Administered 2012-07-15: 30 mg via INTRAVENOUS
  Filled 2012-07-15: qty 1

## 2012-07-15 MED ORDER — SODIUM CHLORIDE 0.9 % IV BOLUS (SEPSIS)
1000.0000 mL | Freq: Once | INTRAVENOUS | Status: AC
  Administered 2012-07-15: 1000 mL via INTRAVENOUS

## 2012-07-15 MED ORDER — TRAMADOL HCL 50 MG PO TABS: 50.0000 mg | ORAL_TABLET | Freq: Four times a day (QID) | ORAL | Status: DC | PRN

## 2012-07-15 NOTE — ED Provider Notes (Signed)
Medical screening examination/treatment/procedure(s) were performed by non-physician practitioner and as supervising physician I was immediately available for consultation/collaboration.  Mahnoor Mathisen, MD 07/15/12 1453 

## 2012-07-15 NOTE — ED Notes (Signed)
Sharp pain lower right side flank

## 2012-07-15 NOTE — ED Provider Notes (Signed)
History     CSN: [id number]  Arrival date & time 07/15/12  1033   First MD Initiated Contact with Patient 07/15/12 1205      Chief Complaint  Patient presents with  . Back Pain    (Consider location/radiation/quality/duration/timing/severity/associated sxs/prior treatment) HPI Comments: Patient is a 32 year old male with a past medical history of kidney stones who presents with a 2 day history of left flank pain. The pain is located in his left flank and radiates around to left lower abdomen. The pain is described as sharp and severe. The pain started gradually and progressively worsened since the onset. The pain is intermittent without known trigger. No alleviating/aggravating factors. The patient has tried nothing for symptoms without relief. Associated symptoms include nausea and dysuria. Patient denies fever, headache, vomiting, diarrhea, chest pain, SOB, constipation. Patient states this feels like his previous kidney stones.   Patient is a 32 y.o. male presenting with back pain.  Back Pain Associated symptoms: dysuria     Past Medical History  Diagnosis Date  . High cholesterol   . Difficult intubation   . Kidney calculi   . Kidney stones   . GERD (gastroesophageal reflux disease)   . Asthma     Past Surgical History  Procedure Laterality Date  . Lithotripsy    . Cystoscopy w/ ureteral stent placement  12/18/2010    Procedure: CYSTOSCOPY WITH RETROGRADE PYELOGRAM/URETERAL STENT PLACEMENT;  Surgeon: Lindaann Slough, MD;  Location: [facility];  Service: Urology;  Laterality: Left;  left ureteroscopy    No family history on file.  History  Substance Use Topics  . Smoking status: Current Every Day Smoker -- 1.00 packs/day    Types: Cigarettes  . Smokeless tobacco: Never Used  . Alcohol Use: 3.6 oz/week    6 Cans of beer per week     Comment: occasionally      Review of Systems  Gastrointestinal: Positive for nausea.  Genitourinary: Positive for dysuria and flank  pain.  Musculoskeletal: Positive for back pain.  All other systems reviewed and are negative.    Allergies  Review of patient's allergies indicates no known allergies.  Home Medications   Current Outpatient Rx  Name  Route  Sig  Dispense  Refill  . EXPIRED: albuterol (PROVENTIL HFA;VENTOLIN HFA) 108 (90 BASE) MCG/ACT inhaler   Inhalation   Inhale 2 puffs into the lungs every 2 (two) hours as needed for wheezing or shortness of breath (cough).   1 Inhaler   0   . HYDROcodone-acetaminophen (VICODIN) 5-500 MG per tablet   Oral   Take 1-2 tablets by mouth every 6 (six) hours as needed for pain.   10 tablet   0   . ibuprofen (ADVIL,MOTRIN) 800 MG tablet   Oral   Take 1 tablet (800 mg total) by mouth 3 (three) times daily.   21 tablet   0   . omeprazole (PRILOSEC) 10 MG capsule   Oral   Take 10 mg by mouth daily. Patient uses this medication prn.         . ondansetron (ZOFRAN) 8 MG tablet   Oral   Take 1 tablet (8 mg total) by mouth every 8 (eight) hours as needed for nausea.   5 tablet   0     BP 120/67  Pulse 79  Temp(Src) 98.8 F (37.1 C) (Oral)  Resp 16  Wt 260 lb (117.935 kg)  BMI 35.25 kg/m2  SpO2 97%  Physical Exam  Nursing note  and vitals reviewed. Constitutional: He is oriented to person, place, and time. He appears well-developed and well-nourished. No distress.  HENT:  Head: Normocephalic and atraumatic.  Eyes: Conjunctivae are normal.  Neck: Normal range of motion.  Cardiovascular: Normal rate and regular rhythm.  Exam reveals no gallop and no friction rub.   No murmur heard. Pulmonary/Chest: Effort normal and breath sounds normal. He has no wheezes. He has no rales. He exhibits no tenderness.  Abdominal: Soft. He exhibits no distension. There is no tenderness. There is no rebound and no guarding.  Genitourinary:  No CVA tenderness  Musculoskeletal: Normal range of motion.  Neurological: He is alert and oriented to person, place, and time.  Coordination normal.  Speech is goal-oriented. Moves limbs without ataxia.   Skin: Skin is warm and dry.  Psychiatric: He has a normal mood and affect. His behavior is normal.    ED Course  Procedures (including critical care time)  Labs Reviewed  CBC WITH DIFFERENTIAL - Abnormal; Notable for the following:    Neutrophils Relative % 33 (*)    Neutro Abs 1.5 (*)    Lymphocytes Relative 51 (*)    All other components within normal limits  BASIC METABOLIC PANEL - Abnormal; Notable for the following:    Glucose, Bld 119 (*)    GFR calc non Af Amer 79 (*)    All other components within normal limits  URINALYSIS, ROUTINE W REFLEX MICROSCOPIC   Ct Abdomen Pelvis Wo Contrast  07/15/2012   *RADIOLOGY REPORT*  Clinical Data: Right flank pain for 3 days in patient history of urinary tract stones.  CT ABDOMEN AND PELVIS WITHOUT CONTRAST  Technique:  Multidetector CT imaging of the abdomen and pelvis was performed following the standard protocol without intravenous contrast.  Comparison: CT abdomen and pelvis 12/11/2011.  Findings: Imaged lung parenchyma demonstrates minimal dependent atelectasis.  No pleural effusion is identified.  There are no urinary tract stones on the right or left and no hydronephrosis.  The kidneys have a normal uninfused appearance. Imaged portions of the liver, spleen, adrenal glands, pancreas and gallbladder appear normal.  No lymphadenopathy or fluid is identified.  The stomach, small and large bowel and appendix all appear normal.  No bony abnormality is identified.  IMPRESSION: Negative for urinary tract stones.  Negative examination.   Original Report Authenticated By: Holley Dexter, M.D.     1. Left flank pain       MDM  12:13 PM Labs and urinalysis pending. Patient will have toradol and zofran for symptoms. Vitals stable and patient afebrile.   1:08 PM Labs and urinalysis unremarkable. Patient will have CT abdomen pelvis to rule out kidney stone.   1:44  PM Ct scan negative for acute changes. Patient likely has muscular strain. Patient will be discharged with Tramadol and Flexeril. Vitals stable and patient afebrile. Patient instructed to return with worsening or concerning symptoms.    Emilia Beck, PA-C 07/15/12 1345

## 2012-11-20 IMAGING — CR DG HAND COMPLETE 3+V*L*
3 series · 3 of 3 positions shown · non-contrast
Comparison: None

CLINICAL DATA: hand swelling and pain

LEFT HAND - COMPLETE 3+ VIEW

[x hand pa left]
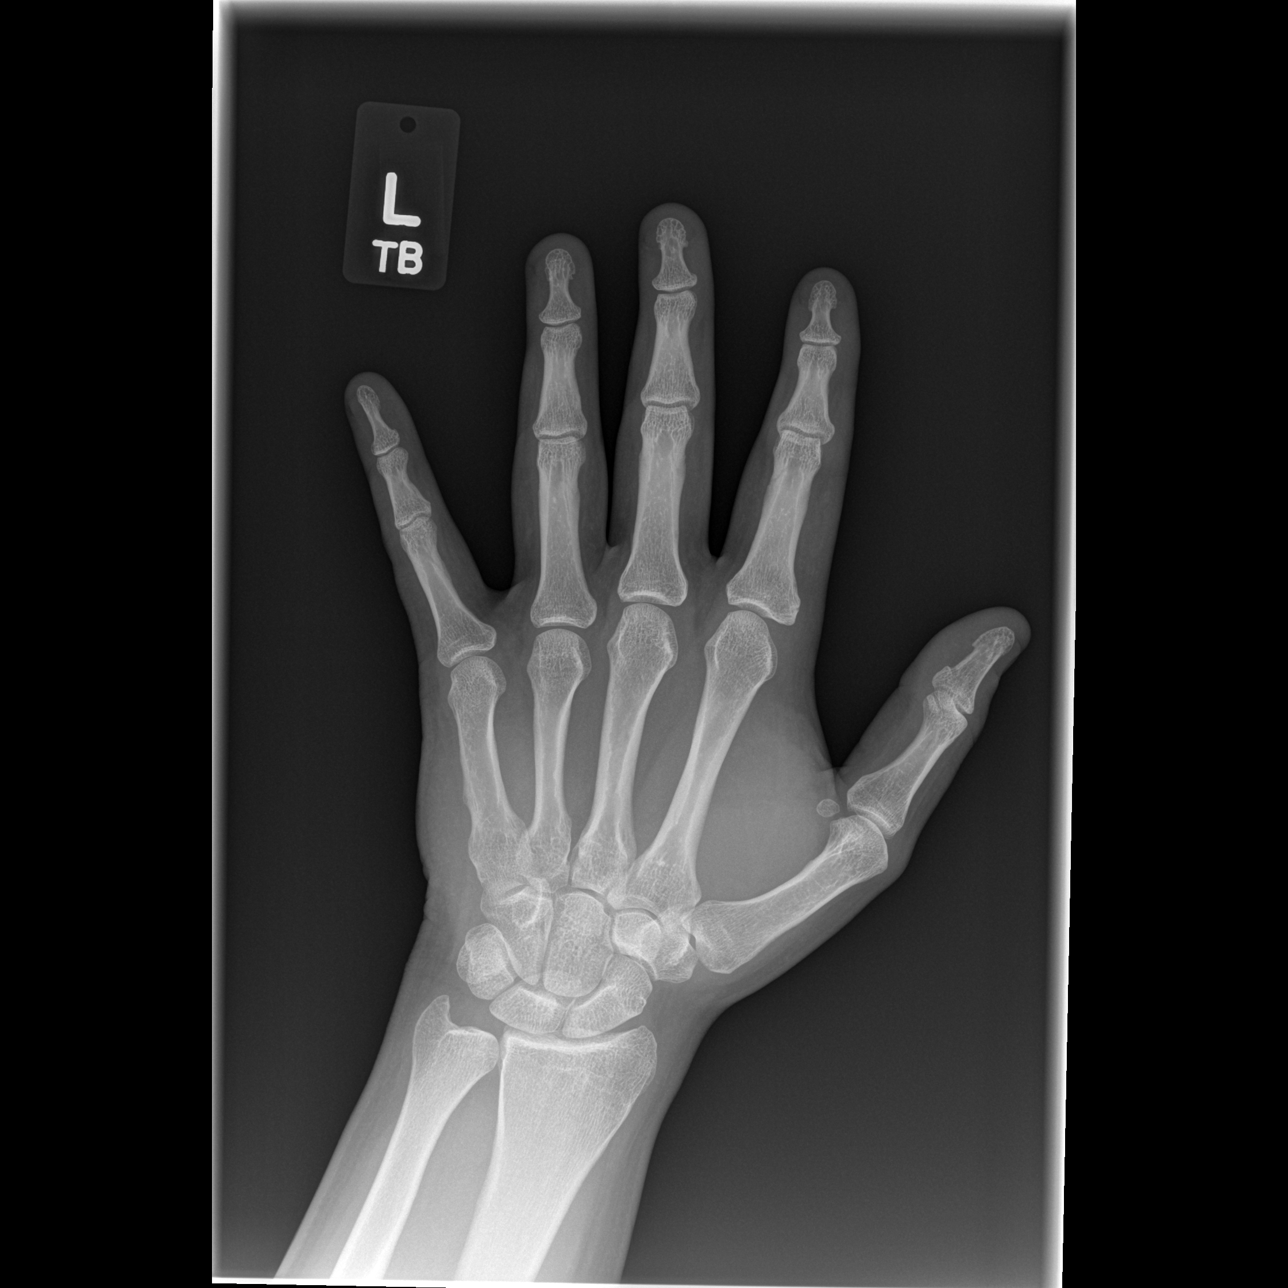

[x hand oblique left]
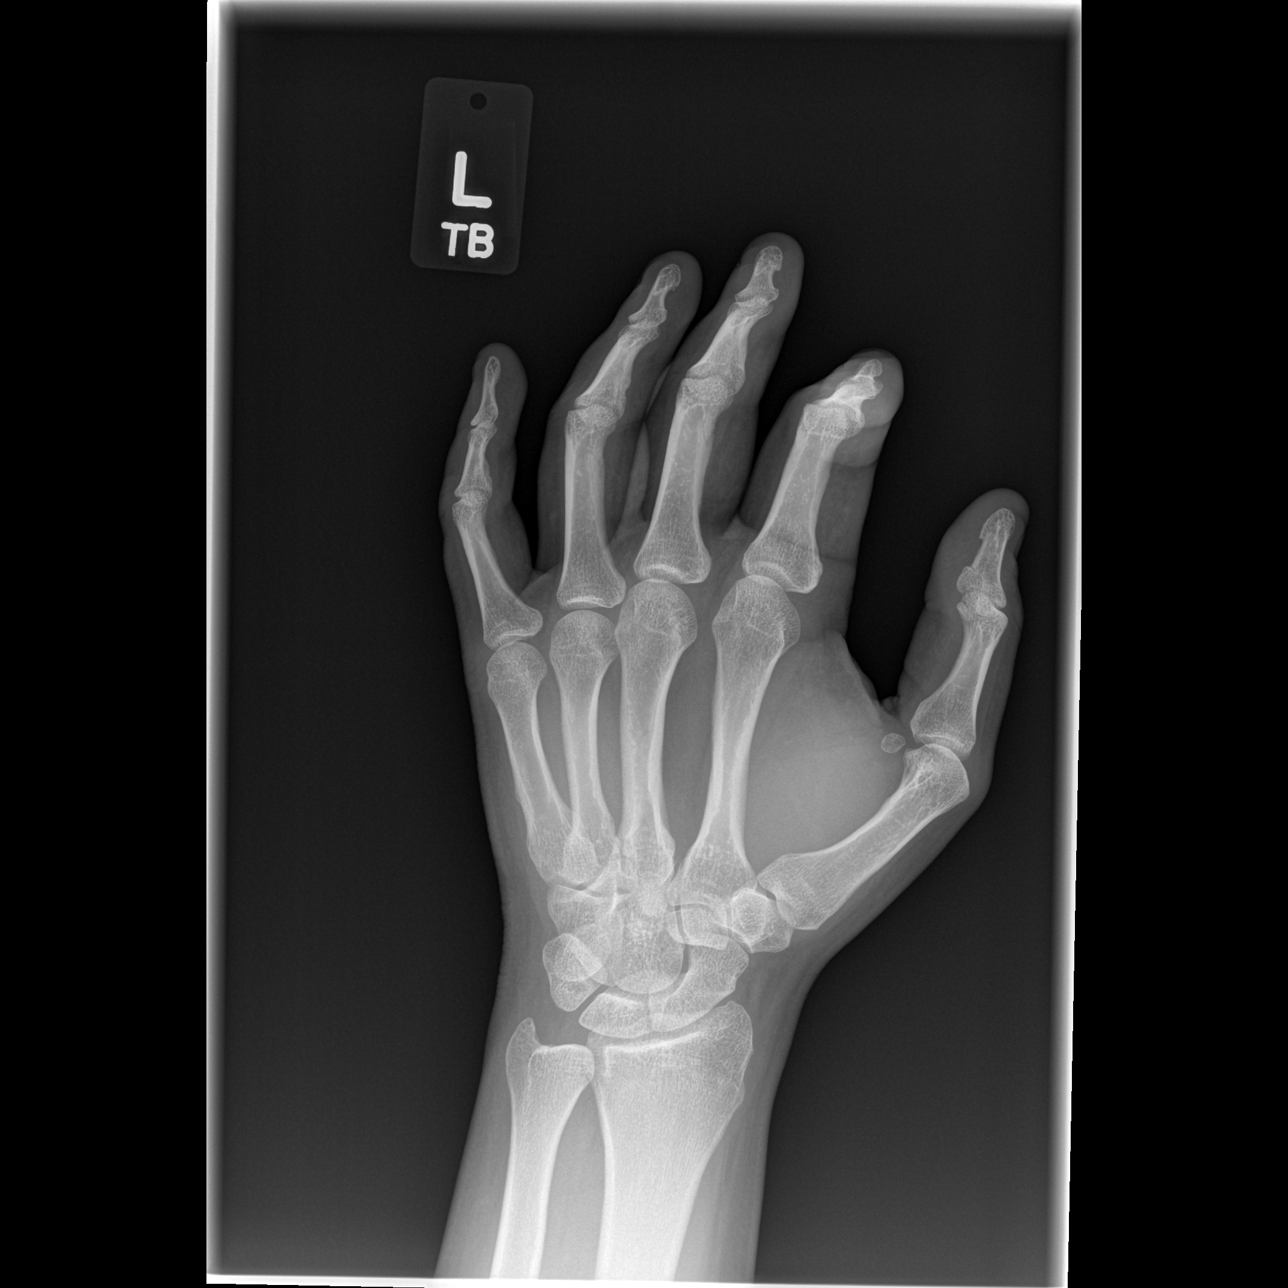

[x hand lat left]
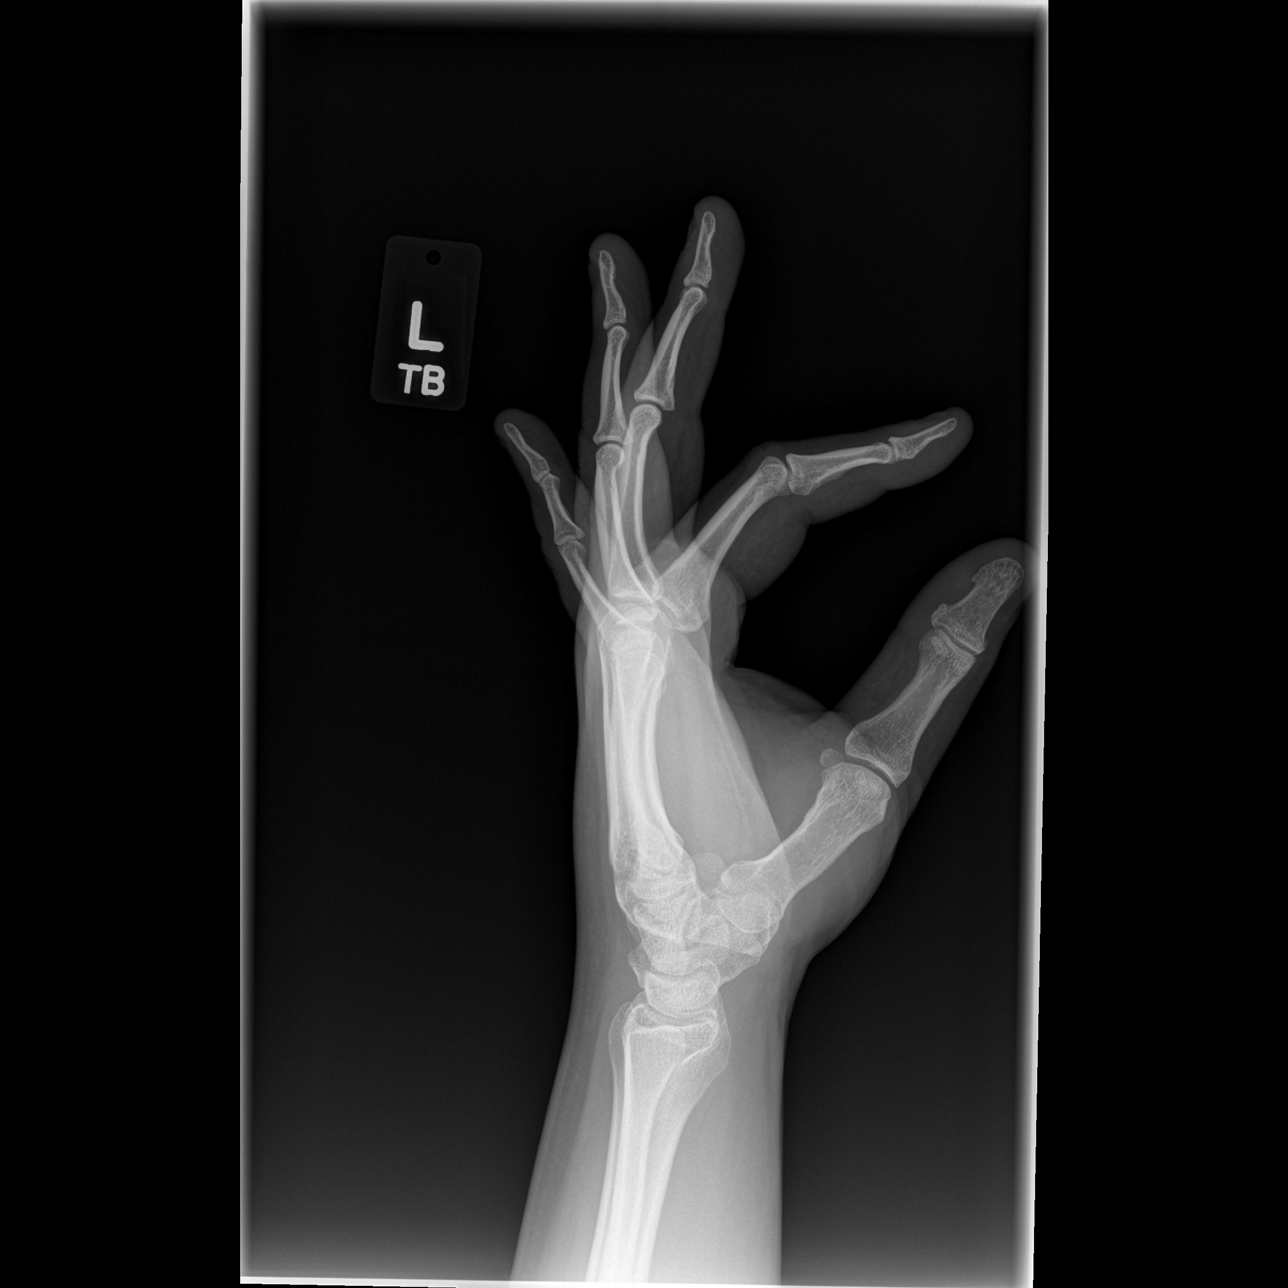

[3 of 3 positions shown; findings below may reference images not displayed]

FINDINGS: There is no evidence of fracture or dislocation.  There
is no evidence of arthropathy or other focal bone abnormality.
Soft tissues are unremarkable.
IMPRESSION: No acute findings.

## 2012-11-26 ENCOUNTER — Emergency Department (HOSPITAL_BASED_OUTPATIENT_CLINIC_OR_DEPARTMENT_OTHER)
Admission: EM | Admit: 2012-11-26 | Discharge: 2012-11-26 | Disposition: A | Discharge status: Home / Self Care | Payer: Self-pay | Attending: Emergency Medicine | Admitting: Emergency Medicine

## 2012-11-26 ENCOUNTER — Encounter (HOSPITAL_BASED_OUTPATIENT_CLINIC_OR_DEPARTMENT_OTHER): Payer: Self-pay | Admitting: Emergency Medicine

## 2012-11-26 DIAGNOSIS — Z79899 Other long term (current) drug therapy: Secondary | ICD-10-CM | POA: Insufficient documentation

## 2012-11-26 DIAGNOSIS — Z8639 Personal history of other endocrine, nutritional and metabolic disease: Secondary | ICD-10-CM | POA: Insufficient documentation

## 2012-11-26 DIAGNOSIS — M545 Low back pain, unspecified: Secondary | ICD-10-CM | POA: Insufficient documentation

## 2012-11-26 DIAGNOSIS — F17200 Nicotine dependence, unspecified, uncomplicated: Secondary | ICD-10-CM | POA: Insufficient documentation

## 2012-11-26 DIAGNOSIS — Z87442 Personal history of urinary calculi: Secondary | ICD-10-CM | POA: Insufficient documentation

## 2012-11-26 DIAGNOSIS — Z791 Long term (current) use of non-steroidal anti-inflammatories (NSAID): Secondary | ICD-10-CM | POA: Insufficient documentation

## 2012-11-26 DIAGNOSIS — Z862 Personal history of diseases of the blood and blood-forming organs and certain disorders involving the immune mechanism: Secondary | ICD-10-CM | POA: Insufficient documentation

## 2012-11-26 DIAGNOSIS — J45909 Unspecified asthma, uncomplicated: Secondary | ICD-10-CM | POA: Insufficient documentation

## 2012-11-26 DIAGNOSIS — K219 Gastro-esophageal reflux disease without esophagitis: Secondary | ICD-10-CM | POA: Insufficient documentation

## 2012-11-26 MED ORDER — METHOCARBAMOL 500 MG PO TABS
500.0000 mg | ORAL_TABLET | Freq: Once | ORAL | Status: AC
  Administered 2012-11-26: 500 mg via ORAL
  Filled 2012-11-26: qty 1

## 2012-11-26 MED ORDER — NAPROXEN 250 MG PO TABS
500.0000 mg | ORAL_TABLET | Freq: Once | ORAL | Status: AC
  Administered 2012-11-26: 500 mg via ORAL
  Filled 2012-11-26: qty 2

## 2012-11-26 MED ORDER — NAPROXEN 500 MG PO TABS: 500.0000 mg | ORAL_TABLET | Freq: Two times a day (BID) | ORAL | Status: DC

## 2012-11-26 MED ORDER — METHOCARBAMOL 500 MG PO TABS: 500.0000 mg | ORAL_TABLET | Freq: Two times a day (BID) | ORAL | Status: DC

## 2012-11-26 NOTE — ED Provider Notes (Signed)
Medical screening examination/treatment/procedure(s) were performed by non-physician practitioner and as supervising physician I was immediately available for consultation/collaboration.  EKG Interpretation   None        Ethelda Chick, MD 11/26/12 [phone]

## 2012-11-26 NOTE — ED Provider Notes (Signed)
CSN: [id number]     Arrival date & time 11/26/12  1731 History   First MD Initiated Contact with Patient 11/26/12 1834     Chief Complaint  Patient presents with  . Back Pain   (Consider location/radiation/quality/duration/timing/severity/associated sxs/prior Treatment) HPI  32 year old male with history of kidney stones presents complaining of back pain. Patient reports acute onset of low back pain after he bent forward to pick up an object. States the object is light however the pain is excruciating. Describe pain as a sharp shooting pain to his left low back radiates down to his left thigh. Pain improves with laying still but worsened with movement. This incident happened at 9 AM this morning. No associated fever, chills, chest pain, shortness of breath, abdominal pain, testicular pain, urinary or bowel incontinence, or saddle paresthesia. Denies any dysuria, hematuria and felt pain is different from his kidney stone. No specific treatment tried. States pain felt similar to a muscle strain that he had in the past. Patient has no history of IV drug use or history of cancer.  Past Medical History  Diagnosis Date  . High cholesterol   . Difficult intubation   . Kidney calculi   . Kidney stones   . GERD (gastroesophageal reflux disease)   . Asthma    Past Surgical History  Procedure Laterality Date  . Lithotripsy    . Cystoscopy w/ ureteral stent placement  12/18/2010    Procedure: CYSTOSCOPY WITH RETROGRADE PYELOGRAM/URETERAL STENT PLACEMENT;  Surgeon: Lindaann Slough, MD;  Location: [facility];  Service: Urology;  Laterality: Left;  left ureteroscopy   No family history on file. History  Substance Use Topics  . Smoking status: Current Every Day Smoker -- 1.00 packs/day    Types: Cigarettes  . Smokeless tobacco: Never Used  . Alcohol Use: 3.6 oz/week    6 Cans of beer per week     Comment: occasionally    Review of Systems  Constitutional: Negative for fever.  Respiratory:  Negative for shortness of breath.   Cardiovascular: Negative for chest pain.  Gastrointestinal: Negative for abdominal pain.  Genitourinary: Negative for hematuria, flank pain, penile pain and testicular pain.  Musculoskeletal: Positive for back pain.  Skin: Negative for rash and wound.  Neurological: Negative for headaches.    Allergies  Review of patient's allergies indicates no known allergies.  Home Medications   Current Outpatient Rx  Name  Route  Sig  Dispense  Refill  . EXPIRED: albuterol (PROVENTIL HFA;VENTOLIN HFA) 108 (90 BASE) MCG/ACT inhaler   Inhalation   Inhale 2 puffs into the lungs every 2 (two) hours as needed for wheezing or shortness of breath (cough).   1 Inhaler   0   . cyclobenzaprine (FLEXERIL) 10 MG tablet   Oral   Take 1 tablet (10 mg total) by mouth 2 (two) times daily as needed for muscle spasms.   20 tablet   0   . HYDROcodone-acetaminophen (VICODIN) 5-500 MG per tablet   Oral   Take 1-2 tablets by mouth every 6 (six) hours as needed for pain.   10 tablet   0   . ibuprofen (ADVIL,MOTRIN) 800 MG tablet   Oral   Take 1 tablet (800 mg total) by mouth 3 (three) times daily.   21 tablet   0   . omeprazole (PRILOSEC) 10 MG capsule   Oral   Take 10 mg by mouth daily. Patient uses this medication prn.         . ondansetron (  ZOFRAN) 8 MG tablet   Oral   Take 1 tablet (8 mg total) by mouth every 8 (eight) hours as needed for nausea.   5 tablet   0   . traMADol (ULTRAM) 50 MG tablet   Oral   Take 1 tablet (50 mg total) by mouth every 6 (six) hours as needed for pain.   15 tablet   0    BP 130/71  Pulse 85  Temp(Src) 98.5 F (36.9 C) (Oral)  Resp 20  Ht 6' (1.829 m)  Wt 255 lb (115.667 kg)  BMI 34.58 kg/m2  SpO2 96% Physical Exam  Nursing note and vitals reviewed. Constitutional: He appears well-developed and well-nourished. No distress.  HENT:  Head: Atraumatic.  Eyes: Conjunctivae are normal.  Neck: Neck supple.  Abdominal:  Soft. There is no tenderness.  Musculoskeletal: He exhibits tenderness (no significant midline spine tenderness. Tenderness to left paralumbar region increased with left hip flexion and extension. Positive straight leg raise.).  Neurological:  Patellar deep tendon reflex intact bilaterally, no foot drop, 5 out of 5 strength to all 4 extremities. Intact distal pedal pulse.  Skin: No rash noted.  Psychiatric: He has a normal mood and affect.    ED Course  Procedures (including critical care time)  6:47 PM No red flag s/s of low back pain. Patient was counseled on back pain precautions and told to do activity as tolerated but do not lift, push, or pull heavy objects more than 10 pounds for the next week. Patient counseled to use ice or heat on back for no longer than 15 minutes every hour.   Patient prescribed muscle relaxer and counseled on proper use of muscle relaxant medication.    Patient urged to follow-up with PCP if pain does not improve with treatment and rest or if pain becomes recurrent. Urged to return with worsening severe pain, loss of bowel or bladder control, trouble walking.  The patient verbalizes understanding and agrees with the plan.  Patient able to ambulate.  Pain medication and muscle relaxant given here in ER.    Labs Review Labs Reviewed - No data to display Imaging Review No results found.  EKG Interpretation   None       MDM   1. Low back pain    BP 130/71  Pulse 85  Temp(Src) 98.5 F (36.9 C) (Oral)  Resp 20  Ht 6' (1.829 m)  Wt 255 lb (115.667 kg)  BMI 34.58 kg/m2  SpO2 96%      Fayrene Helper, PA-C 11/26/12 1851

## 2012-11-26 NOTE — ED Notes (Signed)
While at work he had sudden onset of lower back pain when he bent forward to pick up an object.

## 2013-02-12 ENCOUNTER — Emergency Department (HOSPITAL_BASED_OUTPATIENT_CLINIC_OR_DEPARTMENT_OTHER): Payer: Self-pay

## 2013-02-12 ENCOUNTER — Encounter (HOSPITAL_BASED_OUTPATIENT_CLINIC_OR_DEPARTMENT_OTHER): Payer: Self-pay | Admitting: Emergency Medicine

## 2013-02-12 ENCOUNTER — Emergency Department (HOSPITAL_BASED_OUTPATIENT_CLINIC_OR_DEPARTMENT_OTHER)
Admission: EM | Admit: 2013-02-12 | Discharge: 2013-02-12 | Disposition: A | Discharge status: Home / Self Care | Payer: Self-pay | Attending: Emergency Medicine | Admitting: Emergency Medicine

## 2013-02-12 DIAGNOSIS — Z87442 Personal history of urinary calculi: Secondary | ICD-10-CM | POA: Insufficient documentation

## 2013-02-12 DIAGNOSIS — IMO0001 Reserved for inherently not codable concepts without codable children: Secondary | ICD-10-CM | POA: Insufficient documentation

## 2013-02-12 DIAGNOSIS — Z791 Long term (current) use of non-steroidal anti-inflammatories (NSAID): Secondary | ICD-10-CM | POA: Insufficient documentation

## 2013-02-12 DIAGNOSIS — Z862 Personal history of diseases of the blood and blood-forming organs and certain disorders involving the immune mechanism: Secondary | ICD-10-CM | POA: Insufficient documentation

## 2013-02-12 DIAGNOSIS — Z8639 Personal history of other endocrine, nutritional and metabolic disease: Secondary | ICD-10-CM | POA: Insufficient documentation

## 2013-02-12 DIAGNOSIS — B9789 Other viral agents as the cause of diseases classified elsewhere: Secondary | ICD-10-CM | POA: Insufficient documentation

## 2013-02-12 DIAGNOSIS — J45901 Unspecified asthma with (acute) exacerbation: Secondary | ICD-10-CM | POA: Insufficient documentation

## 2013-02-12 DIAGNOSIS — K219 Gastro-esophageal reflux disease without esophagitis: Secondary | ICD-10-CM | POA: Insufficient documentation

## 2013-02-12 DIAGNOSIS — Z79899 Other long term (current) drug therapy: Secondary | ICD-10-CM | POA: Insufficient documentation

## 2013-02-12 DIAGNOSIS — B349 Viral infection, unspecified: Secondary | ICD-10-CM

## 2013-02-12 DIAGNOSIS — F17200 Nicotine dependence, unspecified, uncomplicated: Secondary | ICD-10-CM | POA: Insufficient documentation

## 2013-02-12 LAB — COMPREHENSIVE METABOLIC PANEL
ALK PHOS: 75 U/L (ref 39–117)
ALT: 29 U/L (ref 0–53)
AST: 20 U/L (ref 0–37)
Albumin: 3.8 g/dL (ref 3.5–5.2)
BUN: 16 mg/dL (ref 6–23)
CO2: 27 meq/L (ref 19–32)
Calcium: 9.3 mg/dL (ref 8.4–10.5)
Chloride: 102 mEq/L (ref 96–112)
Creatinine, Ser: 1.2 mg/dL (ref 0.50–1.35)
GFR, EST NON AFRICAN AMERICAN: 79 mL/min — AB (ref >90)
GLUCOSE: 107 mg/dL — AB (ref 70–99)
POTASSIUM: 3.9 meq/L (ref 3.7–5.3)
Sodium: 143 mEq/L (ref 137–147)
TOTAL PROTEIN: 7.2 g/dL (ref 6.0–8.3)
Total Bilirubin: 0.9 mg/dL (ref 0.3–1.2)

## 2013-02-12 LAB — CBC WITH DIFFERENTIAL/PLATELET
Basophils Absolute: 0 10*3/uL (ref 0.0–0.1)
Basophils Relative: 0 % (ref 0–1)
EOS ABS: 0.2 10*3/uL (ref 0.0–0.7)
EOS PCT: 3 % (ref 0–5)
HCT: 46.9 % (ref 39.0–52.0)
HEMOGLOBIN: 16 g/dL (ref 13.0–17.0)
LYMPHS ABS: 1.6 10*3/uL (ref 0.7–4.0)
LYMPHS PCT: 23 % (ref 12–46)
MCH: 29.6 pg (ref 26.0–34.0)
MCHC: 34.1 g/dL (ref 30.0–36.0)
MCV: 86.9 fL (ref 78.0–100.0)
MONOS PCT: 7 % (ref 3–12)
Monocytes Absolute: 0.5 10*3/uL (ref 0.1–1.0)
NEUTROS PCT: 67 % (ref 43–77)
Neutro Abs: 4.6 10*3/uL (ref 1.7–7.7)
Platelets: 190 10*3/uL (ref 150–400)
RBC: 5.4 MIL/uL (ref 4.22–5.81)
RDW: 13.5 % (ref 11.5–15.5)
WBC: 6.8 10*3/uL (ref 4.0–10.5)

## 2013-02-12 MED ORDER — ONDANSETRON HCL 4 MG PO TABS: 4.0000 mg | ORAL_TABLET | Freq: Three times a day (TID) | ORAL | Status: DC | PRN

## 2013-02-12 MED ORDER — ONDANSETRON HCL 4 MG/2ML IJ SOLN
INTRAMUSCULAR | Status: AC
  Filled 2013-02-12: qty 2

## 2013-02-12 MED ORDER — ALBUTEROL SULFATE HFA 108 (90 BASE) MCG/ACT IN AERS: 2.0000 | INHALATION_SPRAY | RESPIRATORY_TRACT | Status: DC | PRN

## 2013-02-12 MED ORDER — SODIUM CHLORIDE 0.9 % IV SOLN
Freq: Once | INTRAVENOUS | Status: AC
  Administered 2013-02-12: 20:00:00 via INTRAVENOUS

## 2013-02-12 MED ORDER — ALBUTEROL SULFATE (2.5 MG/3ML) 0.083% IN NEBU
2.5000 mg | INHALATION_SOLUTION | Freq: Once | RESPIRATORY_TRACT | Status: AC
  Administered 2013-02-12: 2.5 mg via RESPIRATORY_TRACT
  Filled 2013-02-12: qty 3

## 2013-02-12 MED ORDER — ONDANSETRON HCL 4 MG/2ML IJ SOLN
4.0000 mg | Freq: Once | INTRAMUSCULAR | Status: AC
  Administered 2013-02-12: 4 mg via INTRAVENOUS

## 2013-02-12 MED ORDER — IPRATROPIUM-ALBUTEROL 0.5-2.5 (3) MG/3ML IN SOLN
3.0000 mL | RESPIRATORY_TRACT | Status: DC
  Administered 2013-02-12: 3 mL via RESPIRATORY_TRACT
  Filled 2013-02-12: qty 3

## 2013-02-12 MED ORDER — HYDROMORPHONE HCL PF 1 MG/ML IJ SOLN
1.0000 mg | Freq: Once | INTRAMUSCULAR | Status: AC
  Administered 2013-02-12: 1 mg via INTRAVENOUS
  Filled 2013-02-12: qty 1

## 2013-02-12 NOTE — ED Provider Notes (Signed)
Medical screening examination/treatment/procedure(s) were performed by non-physician practitioner and as supervising physician I was immediately available for consultation/collaboration.    Celene KrasJon R Verdell Kincannon, MD 02/12/13 2158

## 2013-02-12 NOTE — ED Provider Notes (Signed)
CSN: [id number]     Arrival date & time 02/12/13  1924 History   First MD Initiated Contact with Patient 02/12/13 1944     Chief Complaint  Patient presents with  . Influenza   (Consider location/radiation/quality/duration/timing/severity/associated sxs/prior Treatment) Patient is a 33 y.o. male presenting with cough. The history is provided by the patient. No language interpreter was used.  Cough Cough characteristics:  Non-productive Severity:  Moderate Duration:  2 days Timing:  Constant Progression:  Worsening Chronicity:  New Smoker: yes   Context: upper respiratory infection   Relieved by:  Nothing Worsened by:  Nothing tried Ineffective treatments:  None tried Associated symptoms: fever, myalgias, rhinorrhea, sinus congestion and wheezing   Risk factors: no recent infection     Past Medical History  Diagnosis Date  . High cholesterol   . Difficult intubation   . Kidney calculi   . Kidney stones   . GERD (gastroesophageal reflux disease)   . Asthma    Past Surgical History  Procedure Laterality Date  . Lithotripsy    . Cystoscopy w/ ureteral stent placement  12/18/2010    Procedure: CYSTOSCOPY WITH RETROGRADE PYELOGRAM/URETERAL STENT PLACEMENT;  Surgeon: Lindaann SloughMarc-Henry Nesi, MD;  Location: [facility];  Service: Urology;  Laterality: Left;  left ureteroscopy   History reviewed. No pertinent family history. History  Substance Use Topics  . Smoking status: Current Every Day Smoker -- 1.00 packs/day    Types: Cigarettes  . Smokeless tobacco: Never Used  . Alcohol Use: 3.6 oz/week    6 Cans of beer per week     Comment: occasionally    Review of Systems  Constitutional: Positive for fever.  HENT: Positive for rhinorrhea.   Respiratory: Positive for cough and wheezing.   Musculoskeletal: Positive for myalgias.  All other systems reviewed and are negative.    Allergies  Review of patient's allergies indicates no known allergies.  Home Medications   Current  Outpatient Rx  Name  Route  Sig  Dispense  Refill  . EXPIRED: albuterol (PROVENTIL HFA;VENTOLIN HFA) 108 (90 BASE) MCG/ACT inhaler   Inhalation   Inhale 2 puffs into the lungs every 2 (two) hours as needed for wheezing or shortness of breath (cough).   1 Inhaler   0   . methocarbamol (ROBAXIN) 500 MG tablet   Oral   Take 1 tablet (500 mg total) by mouth 2 (two) times daily.   20 tablet   0   . naproxen (NAPROSYN) 500 MG tablet   Oral   Take 1 tablet (500 mg total) by mouth 2 (two) times daily.   30 tablet   0   . omeprazole (PRILOSEC) 10 MG capsule   Oral   Take 10 mg by mouth daily. Patient uses this medication prn.         . ondansetron (ZOFRAN) 8 MG tablet   Oral   Take 1 tablet (8 mg total) by mouth every 8 (eight) hours as needed for nausea.   5 tablet   0    BP 149/99  Pulse 104  Temp(Src) 98.2 F (36.8 C) (Oral)  Resp 16  Ht 5\' 11"  (1.803 m)  Wt 265 lb (120.203 kg)  BMI 36.98 kg/m2  SpO2 94% Physical Exam  Nursing note and vitals reviewed. Constitutional: He appears well-developed and well-nourished.  HENT:  Head: Normocephalic and atraumatic.  Right Ear: External ear normal.  Left Ear: External ear normal.  Mouth/Throat: Oropharynx is clear and moist.  Eyes: Pupils are equal, round,  and reactive to light.  Neck: Normal range of motion. Neck supple.  Cardiovascular: Normal rate.   Pulmonary/Chest: No respiratory distress.  Abdominal: Soft. There is tenderness.  Musculoskeletal: Normal range of motion.  Neurological: He is alert.  Skin: Skin is warm.    ED Course  Procedures (including critical care time) Labs Review Labs Reviewed  COMPREHENSIVE METABOLIC PANEL - Abnormal; Notable for the following:    Glucose, Bld 107 (*)    GFR calc non Af Amer 79 (*)    All other components within normal limits  CBC WITH DIFFERENTIAL   Imaging Review Dg Chest 2 View  02/12/2013   CLINICAL DATA:  Chest pain.  EXAM: CHEST  2 VIEW  COMPARISON:  None.   FINDINGS: The heart size and mediastinal contours are within normal limits. Both lungs are clear. The visualized skeletal structures are unremarkable.  IMPRESSION: No active cardiopulmonary disease.   Electronically Signed   By: Salome Holmes M.D.   On: 02/12/2013 20:43    EKG Interpretation   None       MDM   1. Viral illness    Pt given albuterol neb.   Pt counseled on stopping smoking.   Pt given Iv fluids.  Chest xray no pneumonia.    Pt advised probable viral illness.   I doubt influenza.   Rx for albuterol inhaler and zofran.   Pt advised clear liquids x 24.  Tylenol every 4 hours    Elson Areas, [place] 02/12/13 2156

## 2013-02-12 NOTE — ED Notes (Signed)
Pt c/o n/v/d and body aches x 2 days

## 2013-02-12 NOTE — Discharge Instructions (Signed)
Cough, Adult  A cough is a reflex that helps clear your throat and airways. It can help heal the body or may be a reaction to an irritated airway. A cough may only last 2 or 3 weeks (acute) or may last more than 8 weeks (chronic).  CAUSES Acute cough:  Viral or bacterial infections. Chronic cough:  Infections.  Allergies.  Asthma.  Post-nasal drip.  Smoking.  Heartburn or acid reflux.  Some medicines.  Chronic lung problems (COPD).  Cancer. SYMPTOMS   Cough.  Fever.  Chest pain.  Increased breathing rate.  High-pitched whistling sound when breathing (wheezing).  Colored mucus that you cough up (sputum). TREATMENT   A bacterial cough may be treated with antibiotic medicine.  A viral cough must run its course and will not respond to antibiotics.  Your caregiver may recommend other treatments if you have a chronic cough. HOME CARE INSTRUCTIONS   Only take over-the-counter or prescription medicines for pain, discomfort, or fever as directed by your caregiver. Use cough suppressants only as directed by your caregiver.  Use a cold steam vaporizer or humidifier in your bedroom or home to help loosen secretions.  Sleep in a semi-upright position if your cough is worse at night.  Rest as needed.  Stop smoking if you smoke. SEEK IMMEDIATE MEDICAL CARE IF:   You have pus in your sputum.  Your cough starts to worsen.  You cannot control your cough with suppressants and are losing sleep.  You begin coughing up blood.  You have difficulty breathing.  You develop pain which is getting worse or is uncontrolled with medicine.  You have a fever. MAKE SURE YOU:   Understand these instructions.  Will watch your condition.  Will get help right away if you are not doing well or get worse. Document Released: 07/16/2010 Document Revised: 04/11/2011 Document Reviewed: 07/16/2010 [facility] Patient Information 2014 [place], [place]. Nausea and Vomiting Nausea is a  sick feeling that often comes before throwing up (vomiting). Vomiting is a reflex where stomach contents come out of your mouth. Vomiting can cause severe loss of body fluids (dehydration). Children and elderly adults can become dehydrated quickly, especially if they also have diarrhea. Nausea and vomiting are symptoms of a condition or disease. It is important to find the cause of your symptoms. CAUSES   Direct irritation of the stomach lining. This irritation can result from increased acid production (gastroesophageal reflux disease), infection, food poisoning, taking certain medicines (such as nonsteroidal anti-inflammatory drugs), alcohol use, or tobacco use.  Signals from the brain.These signals could be caused by a headache, heat exposure, an inner ear disturbance, increased pressure in the brain from injury, infection, a tumor, or a concussion, pain, emotional stimulus, or metabolic problems.  An obstruction in the gastrointestinal tract (bowel obstruction).  Illnesses such as diabetes, hepatitis, gallbladder problems, appendicitis, kidney problems, cancer, sepsis, atypical symptoms of a heart attack, or eating disorders.  Medical treatments such as chemotherapy and radiation.  Receiving medicine that makes you sleep (general anesthetic) during surgery. DIAGNOSIS Your caregiver may ask for tests to be done if the problems do not improve after a few days. Tests may also be done if symptoms are severe or if the reason for the nausea and vomiting is not clear. Tests may include:  Urine tests.  Blood tests.  Stool tests.  Cultures (to look for evidence of infection).  X-rays or other imaging studies. Test results can help your caregiver make decisions about treatment or the need for  additional tests. TREATMENT You need to stay well hydrated. Drink frequently but in small amounts.You may wish to drink water, sports drinks, clear broth, or eat frozen ice pops or gelatin dessert to help  stay hydrated.When you eat, eating slowly may help prevent nausea.There are also some antinausea medicines that may help prevent nausea. HOME CARE INSTRUCTIONS   Take all medicine as directed by your caregiver.  If you do not have an appetite, do not force yourself to eat. However, you must continue to drink fluids.  If you have an appetite, eat a normal diet unless your caregiver tells you differently.  Eat a variety of complex carbohydrates (rice, wheat, potatoes, bread), lean meats, yogurt, fruits, and vegetables.  Avoid high-fat foods because they are more difficult to digest.  Drink enough water and fluids to keep your urine clear or pale yellow.  If you are dehydrated, ask your caregiver for specific rehydration instructions. Signs of dehydration may include:  Severe thirst.  Dry lips and mouth.  Dizziness.  Dark urine.  Decreasing urine frequency and amount.  Confusion.  Rapid breathing or pulse. SEEK IMMEDIATE MEDICAL CARE IF:   You have blood or brown flecks (like coffee grounds) in your vomit.  You have black or bloody stools.  You have a severe headache or stiff neck.  You are confused.  You have severe abdominal pain.  You have chest pain or trouble breathing.  You do not urinate at least once every 8 hours.  You develop cold or clammy skin.  You continue to vomit for longer than 24 to 48 hours.  You have a fever. MAKE SURE YOU:   Understand these instructions.  Will watch your condition.  Will get help right away if you are not doing well or get worse. Document Released: 01/17/2005 Document Revised: 04/11/2011 Document Reviewed: 06/16/2010 [facility]Exit[facility] Patient Information 2014 [place]ExitCare, [place]LL[place].

## 2013-02-14 ENCOUNTER — Encounter (HOSPITAL_BASED_OUTPATIENT_CLINIC_OR_DEPARTMENT_OTHER): Payer: Self-pay | Admitting: Emergency Medicine

## 2013-02-14 ENCOUNTER — Emergency Department (HOSPITAL_BASED_OUTPATIENT_CLINIC_OR_DEPARTMENT_OTHER)
Admission: EM | Admit: 2013-02-14 | Discharge: 2013-02-14 | Disposition: A | Discharge status: Home / Self Care | Payer: Self-pay | Attending: Emergency Medicine | Admitting: Emergency Medicine

## 2013-02-14 DIAGNOSIS — K219 Gastro-esophageal reflux disease without esophagitis: Secondary | ICD-10-CM | POA: Insufficient documentation

## 2013-02-14 DIAGNOSIS — Z862 Personal history of diseases of the blood and blood-forming organs and certain disorders involving the immune mechanism: Secondary | ICD-10-CM | POA: Insufficient documentation

## 2013-02-14 DIAGNOSIS — Z87442 Personal history of urinary calculi: Secondary | ICD-10-CM | POA: Insufficient documentation

## 2013-02-14 DIAGNOSIS — Z79899 Other long term (current) drug therapy: Secondary | ICD-10-CM | POA: Insufficient documentation

## 2013-02-14 DIAGNOSIS — J45909 Unspecified asthma, uncomplicated: Secondary | ICD-10-CM | POA: Insufficient documentation

## 2013-02-14 DIAGNOSIS — F17200 Nicotine dependence, unspecified, uncomplicated: Secondary | ICD-10-CM | POA: Insufficient documentation

## 2013-02-14 DIAGNOSIS — R197 Diarrhea, unspecified: Secondary | ICD-10-CM | POA: Insufficient documentation

## 2013-02-14 DIAGNOSIS — J111 Influenza due to unidentified influenza virus with other respiratory manifestations: Secondary | ICD-10-CM | POA: Insufficient documentation

## 2013-02-14 DIAGNOSIS — Z8639 Personal history of other endocrine, nutritional and metabolic disease: Secondary | ICD-10-CM | POA: Insufficient documentation

## 2013-02-14 DIAGNOSIS — Z791 Long term (current) use of non-steroidal anti-inflammatories (NSAID): Secondary | ICD-10-CM | POA: Insufficient documentation

## 2013-02-14 DIAGNOSIS — R112 Nausea with vomiting, unspecified: Secondary | ICD-10-CM | POA: Insufficient documentation

## 2013-02-14 MED ORDER — SODIUM CHLORIDE 0.9 % IV BOLUS (SEPSIS)
1000.0000 mL | Freq: Once | INTRAVENOUS | Status: AC
  Administered 2013-02-14: 1000 mL via INTRAVENOUS

## 2013-02-14 MED ORDER — HYDROCOD POLST-CHLORPHEN POLST 10-8 MG/5ML PO LQCR: 5.0000 mL | Freq: Two times a day (BID) | ORAL | Status: DC | PRN

## 2013-02-14 MED ORDER — METOCLOPRAMIDE HCL 10 MG PO TABS: 10.0000 mg | ORAL_TABLET | Freq: Four times a day (QID) | ORAL | Status: DC | PRN

## 2013-02-14 MED ORDER — METOCLOPRAMIDE HCL 5 MG/ML IJ SOLN
10.0000 mg | Freq: Once | INTRAMUSCULAR | Status: AC
  Administered 2013-02-14: 10 mg via INTRAVENOUS
  Filled 2013-02-14: qty 2

## 2013-02-14 MED ORDER — KETOROLAC TROMETHAMINE 30 MG/ML IJ SOLN
30.0000 mg | Freq: Once | INTRAMUSCULAR | Status: AC
  Administered 2013-02-14: 30 mg via INTRAVENOUS
  Filled 2013-02-14: qty 1

## 2013-02-14 NOTE — ED Notes (Signed)
Pt reports that he was seen here in er  1/12 dx with flu, given fluids, started feeling better today, went to work and started feeling bad around 1900, has taken no medication at home, reports vomiting x 1 this pm

## 2013-02-14 NOTE — ED Provider Notes (Signed)
CSN: [id number]     Arrival date & time 02/14/13  [id number] History   First MD Initiated Contact with Patient 02/14/13 [phone]     Chief Complaint  Patient presents with  . Influenza   (Consider location/radiation/quality/duration/timing/severity/associated sxs/prior Treatment) HPI Is a [age] year old male who states he has had nasal congestion and sinus pressure for about 2 and half weeks. He was seen here 2 days ago for flulike symptoms. Specifically he developed cough, body aches, diarrhea, nausea and vomiting. He was given fluids and IV Zofran and was discharged with a prescription for Zofran and albuterol. He did not get his albuterol inhaler filled. He states he was feeling better yesterday morning and went to work. When he came home yesterday evening after eating dinner he became nauseated and started vomiting again. He has vomited one time but has been retching. The symptoms are severe enough that he compares his current illness to being "beat all over with a baseball bat". He has had a fever as high as 102.5 at home. He did not take anything for the fever.  Past Medical History  Diagnosis Date  . High cholesterol   . Difficult intubation   . Kidney calculi   . Kidney stones   . GERD (gastroesophageal reflux disease)   . Asthma    Past Surgical History  Procedure Laterality Date  . Lithotripsy    . Cystoscopy w/ ureteral stent placement  12/18/2010    Procedure: CYSTOSCOPY WITH RETROGRADE PYELOGRAM/URETERAL STENT PLACEMENT;  Surgeon: Lindaann SloughMarc-Henry Nesi, MD;  Location: [facility];  Service: Urology;  Laterality: Left;  left ureteroscopy   History reviewed. No pertinent family history. History  Substance Use Topics  . Smoking status: Current Every Day Smoker -- 1.00 packs/day    Types: Cigarettes  . Smokeless tobacco: Never Used  . Alcohol Use: 3.6 oz/week    6 Cans of beer per week     Comment: occasionally    Review of Systems  All other systems reviewed and are negative.    Allergies   Review of patient's allergies indicates no known allergies.  Home Medications   Current Outpatient Rx  Name  Route  Sig  Dispense  Refill  . albuterol (PROVENTIL HFA;VENTOLIN HFA) 108 (90 BASE) MCG/ACT inhaler   Inhalation   Inhale 2 puffs into the lungs every 2 (two) hours as needed for wheezing or shortness of breath (cough).   1 Inhaler   0   . naproxen (NAPROSYN) 500 MG tablet   Oral   Take 1 tablet (500 mg total) by mouth 2 (two) times daily.   30 tablet   0   . ondansetron (ZOFRAN) 4 MG tablet   Oral   Take 1 tablet (4 mg total) by mouth every 8 (eight) hours as needed for nausea.   10 tablet   0   . methocarbamol (ROBAXIN) 500 MG tablet   Oral   Take 1 tablet (500 mg total) by mouth 2 (two) times daily.   20 tablet   0   . omeprazole (PRILOSEC) 10 MG capsule   Oral   Take 10 mg by mouth daily. Patient uses this medication prn.          BP 151/94  Pulse 118  Temp(Src) 100.6 F (38.1 C) (Oral)  Resp 20  SpO2 96%  Physical Exam General: Well-developed, well-nourished male in no acute distress; appearance consistent with age of record HENT: normocephalic; atraumatic; nasal congestion; mild pharyngeal erythema Eyes: pupils equal, round and reactive  to light; extraocular muscles intact Neck: supple; mild anterior cervical lymphadenopathy Heart: regular rate and rhythm; tachycardia Lungs: clear to auscultation bilaterally Abdomen: soft; nondistended; nontender; no masses or hepatosplenomegaly; bowel sounds present Extremities: No deformity; full range of motion; pulses normal Neurologic: Awake, alert and oriented; motor function intact in all extremities and symmetric; no facial droop Skin: Warm and dry; facial plethora Psychiatric: Flat affect    ED Course  Procedures (including critical care time)    MDM   5:12 AM Feeling better after IV fluid bolus and IV Reglan. Drinking fluids without emesis. He aches and fever improved with IV  Toradol.    Hanley Seamen, MD 02/14/13 [phone]

## 2013-02-14 NOTE — ED Notes (Signed)
Fluids provided per md request, pt tolerated without difficulty

## 2013-06-02 ENCOUNTER — Encounter (HOSPITAL_BASED_OUTPATIENT_CLINIC_OR_DEPARTMENT_OTHER): Payer: Self-pay | Admitting: Emergency Medicine

## 2013-06-02 ENCOUNTER — Emergency Department (HOSPITAL_BASED_OUTPATIENT_CLINIC_OR_DEPARTMENT_OTHER)
Admission: EM | Admit: 2013-06-02 | Discharge: 2013-06-02 | Disposition: A | Discharge status: Home / Self Care | Payer: Self-pay | Attending: Emergency Medicine | Admitting: Emergency Medicine

## 2013-06-02 DIAGNOSIS — K219 Gastro-esophageal reflux disease without esophagitis: Secondary | ICD-10-CM | POA: Insufficient documentation

## 2013-06-02 DIAGNOSIS — Z87442 Personal history of urinary calculi: Secondary | ICD-10-CM | POA: Insufficient documentation

## 2013-06-02 DIAGNOSIS — Z79899 Other long term (current) drug therapy: Secondary | ICD-10-CM | POA: Insufficient documentation

## 2013-06-02 DIAGNOSIS — Z862 Personal history of diseases of the blood and blood-forming organs and certain disorders involving the immune mechanism: Secondary | ICD-10-CM | POA: Insufficient documentation

## 2013-06-02 DIAGNOSIS — F17200 Nicotine dependence, unspecified, uncomplicated: Secondary | ICD-10-CM | POA: Insufficient documentation

## 2013-06-02 DIAGNOSIS — K0381 Cracked tooth: Secondary | ICD-10-CM | POA: Insufficient documentation

## 2013-06-02 DIAGNOSIS — K0889 Other specified disorders of teeth and supporting structures: Secondary | ICD-10-CM

## 2013-06-02 DIAGNOSIS — Z791 Long term (current) use of non-steroidal anti-inflammatories (NSAID): Secondary | ICD-10-CM | POA: Insufficient documentation

## 2013-06-02 DIAGNOSIS — Z8639 Personal history of other endocrine, nutritional and metabolic disease: Secondary | ICD-10-CM | POA: Insufficient documentation

## 2013-06-02 DIAGNOSIS — K029 Dental caries, unspecified: Secondary | ICD-10-CM | POA: Insufficient documentation

## 2013-06-02 DIAGNOSIS — K089 Disorder of teeth and supporting structures, unspecified: Secondary | ICD-10-CM | POA: Insufficient documentation

## 2013-06-02 DIAGNOSIS — J45909 Unspecified asthma, uncomplicated: Secondary | ICD-10-CM | POA: Insufficient documentation

## 2013-06-02 MED ORDER — HYDROCODONE-ACETAMINOPHEN 5-325 MG PO TABS: 2.0000 | ORAL_TABLET | ORAL | Status: DC | PRN

## 2013-06-02 MED ORDER — CLINDAMYCIN HCL 150 MG PO CAPS: 150.0000 mg | ORAL_CAPSULE | Freq: Four times a day (QID) | ORAL | Status: DC

## 2013-06-02 MED ORDER — HYDROCODONE-ACETAMINOPHEN 5-325 MG PO TABS
2.0000 | ORAL_TABLET | Freq: Once | ORAL | Status: AC
  Administered 2013-06-02: 2 via ORAL
  Filled 2013-06-02: qty 2

## 2013-06-02 NOTE — Discharge Instructions (Signed)

## 2013-06-02 NOTE — ED Provider Notes (Signed)
Medical screening examination/treatment/procedure(s) were conducted as a shared visit with non-physician practitioner(s) and myself.  I personally evaluated the patient during the encounter.     Celene KrasJon R Ceasia Elwell, MD 06/02/13 [phone]

## 2013-06-02 NOTE — ED Provider Notes (Signed)
CSN: [id number]     Arrival date & time 06/02/13  1951 History   First MD Initiated Contact with Patient 06/02/13 2008     Chief Complaint  Patient presents with  . Dental Pain     (Consider location/radiation/quality/duration/timing/severity/associated sxs/prior Treatment) Patient is a 33 y.o. male presenting with tooth pain. The history is provided by the patient. No language interpreter was used.  Dental Pain Location:  Lower Lower teeth location:  30/RL 1st molar and 29/RL 2nd bicuspid Quality:  Aching Severity:  Moderate Onset quality:  Gradual Duration:  1 day Timing:  Constant Progression:  Worsening Chronicity:  New Context: abscess   Relieved by:  Nothing Ineffective treatments:  None tried Associated symptoms: facial swelling     Past Medical History  Diagnosis Date  . High cholesterol   . Difficult intubation   . Kidney calculi   . Kidney stones   . GERD (gastroesophageal reflux disease)   . Asthma    Past Surgical History  Procedure Laterality Date  . Lithotripsy    . Cystoscopy w/ ureteral stent placement  12/18/2010    Procedure: CYSTOSCOPY WITH RETROGRADE PYELOGRAM/URETERAL STENT PLACEMENT;  Surgeon: Lindaann SloughMarc-Henry Nesi, MD;  Location: [facility];  Service: Urology;  Laterality: Left;  left ureteroscopy   No family history on file. History  Substance Use Topics  . Smoking status: Current Every Day Smoker -- 1.00 packs/day    Types: Cigarettes  . Smokeless tobacco: Never Used  . Alcohol Use: 3.6 oz/week    6 Cans of beer per week     Comment: occasionally    Review of Systems  HENT: Positive for dental problem and facial swelling.   All other systems reviewed and are negative.     Allergies  Review of patient's allergies indicates no known allergies.  Home Medications   Prior to Admission medications   Medication Sig Start Date End Date Taking? Authorizing Provider  albuterol (PROVENTIL HFA;VENTOLIN HFA) 108 (90 BASE) MCG/ACT inhaler Inhale 2  puffs into the lungs every 2 (two) hours as needed for wheezing or shortness of breath (cough). 02/12/13 02/12/14 Yes Lonia SkinnerLeslie K Avik Leoni, PA-C  esomeprazole (NEXIUM) 20 MG capsule Take 20 mg by mouth daily at 12 noon.   Yes Historical Provider, MD  omeprazole (PRILOSEC) 10 MG capsule Take 10 mg by mouth daily. Patient uses this medication prn.   Yes Historical Provider, MD  ondansetron (ZOFRAN) 4 MG tablet Take 1 tablet (4 mg total) by mouth every 8 (eight) hours as needed for nausea. 02/12/13  Yes Elson AreasLeslie K Halsey Persaud, PA-C  chlorpheniramine-HYDROcodone (TUSSIONEX PENNKINETIC ER) 10-8 MG/5ML LQCR Take 5 mLs by mouth every 12 (twelve) hours as needed for cough. 02/14/13   John L Molpus, MD  clindamycin (CLEOCIN) 150 MG capsule Take 1 capsule (150 mg total) by mouth every 6 (six) hours. 06/02/13   Elson AreasLeslie K Jeylin Woodmansee, PA-C  HYDROcodone-acetaminophen (NORCO/VICODIN) 5-325 MG per tablet Take 2 tablets by mouth every 4 (four) hours as needed. 06/02/13   Elson AreasLeslie K Lowen Barringer, PA-C  methocarbamol (ROBAXIN) 500 MG tablet Take 1 tablet (500 mg total) by mouth 2 (two) times daily. 11/26/12   Fayrene HelperBowie Tran, PA-C  metoCLOPramide (REGLAN) 10 MG tablet Take 1 tablet (10 mg total) by mouth every 6 (six) hours as needed for nausea or vomiting (nausea/headache). 02/14/13   John L Molpus, MD  naproxen (NAPROSYN) 500 MG tablet Take 1 tablet (500 mg total) by mouth 2 (two) times daily. 11/26/12   Fayrene HelperBowie Tran, PA-C  BP 135/93  Pulse 89  Temp(Src) 99.3 F (37.4 C) (Oral)  Resp 20  Ht 6' (1.829 m)  Wt 270 lb (122.471 kg)  BMI 36.61 kg/m2  SpO2 99% Physical Exam  Nursing note and vitals reviewed. Constitutional: He appears well-developed and well-nourished.  HENT:  Head: Normocephalic and atraumatic.  Multiple broken teeth, decay  Eyes: Pupils are equal, round, and reactive to light.  Neck: Normal range of motion.  Cardiovascular: Normal rate and normal heart sounds.   Pulmonary/Chest: Effort normal.  Abdominal: Soft.  Musculoskeletal:  Normal range of motion.  Neurological: He is alert.  Skin: Skin is warm.    ED Course  Procedures (including critical care time) Labs Review Labs Reviewed - No data to display  Imaging Review No results found.   EKG Interpretation None      MDM   Final diagnoses:  Toothache    Clindamycin hydrocodone    Elson AreasLeslie K Cricket Goodlin, PA-C 06/02/13 2102

## 2013-06-02 NOTE — ED Notes (Signed)
Right lower dental pain and facial swelling x 1 day

## 2013-06-30 ENCOUNTER — Encounter (HOSPITAL_BASED_OUTPATIENT_CLINIC_OR_DEPARTMENT_OTHER): Payer: Self-pay | Admitting: Emergency Medicine

## 2013-06-30 ENCOUNTER — Emergency Department (HOSPITAL_BASED_OUTPATIENT_CLINIC_OR_DEPARTMENT_OTHER)
Admission: EM | Admit: 2013-06-30 | Discharge: 2013-06-30 | Disposition: A | Discharge status: Home / Self Care | Payer: Self-pay | Attending: Emergency Medicine | Admitting: Emergency Medicine

## 2013-06-30 DIAGNOSIS — F17200 Nicotine dependence, unspecified, uncomplicated: Secondary | ICD-10-CM | POA: Insufficient documentation

## 2013-06-30 DIAGNOSIS — K089 Disorder of teeth and supporting structures, unspecified: Secondary | ICD-10-CM | POA: Insufficient documentation

## 2013-06-30 DIAGNOSIS — K219 Gastro-esophageal reflux disease without esophagitis: Secondary | ICD-10-CM | POA: Insufficient documentation

## 2013-06-30 DIAGNOSIS — J45909 Unspecified asthma, uncomplicated: Secondary | ICD-10-CM | POA: Insufficient documentation

## 2013-06-30 DIAGNOSIS — Z862 Personal history of diseases of the blood and blood-forming organs and certain disorders involving the immune mechanism: Secondary | ICD-10-CM | POA: Insufficient documentation

## 2013-06-30 DIAGNOSIS — Z79899 Other long term (current) drug therapy: Secondary | ICD-10-CM | POA: Insufficient documentation

## 2013-06-30 DIAGNOSIS — Z791 Long term (current) use of non-steroidal anti-inflammatories (NSAID): Secondary | ICD-10-CM | POA: Insufficient documentation

## 2013-06-30 DIAGNOSIS — Z87442 Personal history of urinary calculi: Secondary | ICD-10-CM | POA: Insufficient documentation

## 2013-06-30 DIAGNOSIS — Z8639 Personal history of other endocrine, nutritional and metabolic disease: Secondary | ICD-10-CM | POA: Insufficient documentation

## 2013-06-30 DIAGNOSIS — K029 Dental caries, unspecified: Secondary | ICD-10-CM

## 2013-06-30 MED ORDER — HYDROCODONE-ACETAMINOPHEN 5-325 MG PO TABS: 1.0000 | ORAL_TABLET | ORAL | Status: DC | PRN

## 2013-06-30 MED ORDER — IBUPROFEN 800 MG PO TABS: 800.0000 mg | ORAL_TABLET | Freq: Three times a day (TID) | ORAL | Status: DC | PRN

## 2013-06-30 MED ORDER — CLINDAMYCIN HCL 150 MG PO CAPS: 450.0000 mg | ORAL_CAPSULE | Freq: Three times a day (TID) | ORAL | Status: DC

## 2013-06-30 NOTE — ED Notes (Signed)
Pt reports right upper back molar dental pain.  States he was on antibiotics for same about 1 month ago and pain subsided but is now hurting again.  Reports dental appt upcoming.

## 2013-06-30 NOTE — Discharge Instructions (Signed)
Dental Care and Dentist Visits °Dental care supports good overall health. Regular dental visits can also help you avoid dental pain, bleeding, infection, and other more serious health problems in the future. It is important to keep the mouth healthy because diseases in the teeth, gums, and other oral tissues can spread to other areas of the body. Some problems, such as diabetes, heart disease, and pre-term labor have been associated with poor oral health.  °See your dentist every 6 months. If you experience emergency problems such as a toothache or broken tooth, go to the dentist right away. If you see your dentist regularly, you may catch problems early. It is easier to be treated for problems in the early stages.  °WHAT TO EXPECT AT A DENTIST VISIT  °Your dentist will look for many common oral health problems and recommend proper treatment. At your regular dental visit, you can expect: °· Gentle cleaning of the teeth and gums. This includes scraping and polishing. This helps to remove the sticky substance around the teeth and gums (plaque). Plaque forms in the mouth shortly after eating. Over time, plaque hardens on the teeth as tartar. If tartar is not removed regularly, it can cause problems. Cleaning also helps remove stains. °· Periodic X-rays. These pictures of the teeth and supporting bone will help your dentist assess the health of your teeth. °· Periodic fluoride treatments. Fluoride is a natural mineral shown to help strengthen teeth. Fluoride treatment involves applying a fluoride gel or varnish to the teeth. It is most commonly done in children. °· Examination of the mouth, tongue, jaws, teeth, and gums to look for any oral health problems, such as: °· Cavities (dental caries). This is decay on the tooth caused by plaque, sugar, and acid in the mouth. It is best to catch a cavity when it is small. °· Inflammation of the gums caused by plaque buildup (gingivitis). °· Problems with the mouth or malformed  or misaligned teeth. °· Oral cancer or other diseases of the soft tissues or jaws.  °KEEP YOUR TEETH AND GUMS HEALTHY °For healthy teeth and gums, follow these general guidelines as well as your dentist's specific advice: °· Have your teeth professionally cleaned at the dentist every 6 months. °· Brush twice daily with a fluoride toothpaste. °· Floss your teeth daily.  °· Ask your dentist if you need fluoride supplements, treatments, or fluoride toothpaste. °· Eat a healthy diet. Reduce foods and drinks with added sugar. °· Avoid smoking. °TREATMENT FOR ORAL HEALTH PROBLEMS °If you have oral health problems, treatment varies depending on the conditions present in your teeth and gums. °· Your caregiver will most likely recommend good oral hygiene at each visit. °· For cavities, gingivitis, or other oral health disease, your caregiver will perform a procedure to treat the problem. This is typically done at a separate appointment. Sometimes your caregiver will refer you to another dental specialist for specific tooth problems or for surgery. °SEEK IMMEDIATE DENTAL CARE IF: °· You have pain, bleeding, or soreness in the gum, tooth, jaw, or mouth area. °· A permanent tooth becomes loose or separated from the gum socket. °· You experience a blow or injury to the mouth or jaw area. °Document Released: 09/29/2010 Document Revised: 04/11/2011 Document Reviewed: 09/29/2010 °ExitCare® Patient Information ©2014 [employer]. ° °Dental Caries  °Dental caries (also called tooth decay) is the most common oral disease. It can occur at any age, but is more common in children and young adults.  °HOW DENTAL CARIES DEVELOPS  °The   process of decay begins when bacteria and foods (particularly sugars and starches) combine in your mouth to produce plaque. Plaque is a substance that sticks to the hard, outer surface of a tooth (enamel). The bacteria in plaque produce acids that attack enamel. These acids may also attack the root surface of  a tooth (cementum) if it is exposed. Repeated attacks dissolve these surfaces and create holes in the tooth (cavities). If left untreated, the acids destroy the other layers of the tooth.  °RISK FACTORS °· Frequent sipping of sugary beverages.   °· Frequent snacking on sugary and starchy foods, especially those that easily get stuck in the teeth.   °· Poor oral hygiene.   °· Dry mouth.   °· Substance abuse such as methamphetamine abuse.   °· Broken or poor-fitting dental restorations.   °· Eating disorders.   °· Gastroesophageal reflux disease (GERD).   °· Certain radiation treatments to the head and neck. °SYMPTOMS °In the early stages of dental caries, symptoms are seldom present. Sometimes white, chalky areas may be seen on the enamel or other tooth layers. In later stages, symptoms may include: °· Pits and holes on the enamel. °· Toothache after sweet, hot, or cold foods or drinks are consumed. °· Pain around the tooth. °· Swelling around the tooth. °DIAGNOSIS  °Most of the time, dental caries is detected during a regular dental checkup. A diagnosis is made after a thorough medical and dental history is taken and the surfaces of your teeth are checked for signs of dental caries. Sometimes special instruments, such as lasers, are used to check for dental caries. Dental X-ray exams may be taken so that areas not visible to the eye (such as between the contact areas of the teeth) can be checked for cavities.  °TREATMENT  °If dental caries is in its early stages, it may be reversed with a fluoride treatment or an application of a remineralizing agent at the dental office. Thorough brushing and flossing at home is needed to aid these treatments. If it is in its later stages, treatment depends on the location and extent of tooth destruction:  °· If a small area of the tooth has been destroyed, the destroyed area will be removed and cavities will be filled with a material such as gold, silver amalgam, or composite  resin.   °· If a large area of the tooth has been destroyed, the destroyed area will be removed and a cap (crown) will be fitted over the remaining tooth structure.   °· If the center part of the tooth (pulp) is affected, a procedure called a root canal will be needed before a filling or crown can be placed.   °· If most of the tooth has been destroyed, the tooth may need to be pulled (extracted). °HOME CARE INSTRUCTIONS °You can prevent, stop, or reverse dental caries at home by practicing good oral hygiene. Good oral hygiene includes: °· Thoroughly cleaning your teeth at least twice a day with a toothbrush and dental floss.   °· Using a fluoride toothpaste. A fluoride mouth rinse may also be used if recommended by your dentist or health care provider.   °· Restricting the amount of sugary and starchy foods and sugary liquids you consume.   °· Avoiding frequent snacking on these foods and sipping of these liquids.   °· Keeping regular visits with a dentist for checkups and cleanings. °PREVENTION  °· Practice good oral hygiene. °· Consider a dental sealant. A dental sealant is a coating material that is applied by your dentist to the pits and grooves   of teeth. The sealant prevents food from being trapped in them. It may protect the teeth for several years.  Ask about fluoride supplements if you live in a community without fluorinated water or with water that has a low fluoride content. Use fluoride supplements as directed by your dentist or health care provider.  Allow fluoride varnish applications to teeth if directed by your dentist or health care provider. Document Released: 10/09/2001 Document Revised: 09/19/2012 Document Reviewed: 01/20/2012 [facility] Patient Information 2014 [place], [place].  Emergency Department Resource Guide 1) Find a Doctor and Pay Out of Pocket Although you won't have to find out who is covered by your insurance plan, it is a good idea to ask around and get recommendations. You  will then need to call the office and see if the doctor you have chosen will accept you as a new patient and what types of options they offer for patients who are self-pay. Some doctors offer discounts or will set up payment plans for their patients who do not have insurance, but you will need to ask so you aren't surprised when you get to your appointment.  2) Contact Your Local Health Department Not all health departments have doctors that can see patients for sick visits, but many do, so it is worth a call to see if yours does. If you don't know where your local health department is, you can check in your phone book. The CDC also has a tool to help you locate your state's health department, and many state websites also have listings of all of their local health departments.  3) Find a Walk-in Clinic If your illness is not likely to be very severe or complicated, you may want to try a walk in clinic. These are popping up all over the country in pharmacies, drugstores, and shopping centers. They're usually staffed by nurse practitioners or physician assistants that have been trained to treat common illnesses and complaints. They're usually fairly quick and inexpensive. However, if you have serious medical issues or chronic medical problems, these are probably not your best option.  No Primary Care Doctor: - Call Health Connect at  [phone] - they can help you locate a primary care doctor that  accepts your insurance, provides certain services, etc. - Physician Referral Service- [phone]  Chronic Pain Problems: Organization         Address  Phone   Notes  [facility]  [phone] Patients need to be referred by their primary care doctor.   Medication Assistance: Organization         Address  Phone   Notes  [facility] Medication [facility] [address] [place], [place] [address] [phone] --Must be a resident of [facility] -- Must  have NO insurance coverage whatsoever (no Medicaid/ Medicare, etc.) -- The pt. MUST have a primary care doctor that directs their care regularly and follows them in the community   [facility]  [phone]   [employer]  [phone]    Agencies that provide inexpensive medical care: Organization         Address  Phone   Notes  [facility]  [phone]   [facility]    [phone]   [facility] [address] [place], [place] [address] [phone]   [facility] [address] [place], [place] [phone]   Planned Parenthood    [phone]  [facility]    [phone]   [facility] and [facility]  [address], [place] Phone:  [phone], Fax:  [phone] Hours of Operation:  9 am - 6 pm, M-F.  Also accepts Medicaid/Medicare and self-pay.  [facility]  [address], [place] Phone: [phone], Fax: [phone] Hours of Operation:  8:30 am - 5:30 pm, M-F.  Also accepts Medicaid and self-pay.  [facility] [place] [address], [place] Phone: [phone]   [facility] [address] Natasha Bence [place], [place] [phone], Ext. [phone] Mondays & Thursdays: 7-9 AM.  First 15 patients are seen on a first come, first serve basis.    Medicaid-accepting [facility] Providers:  Organization         Address  Phone   Notes  [facility] [address], [place] [phone] Also accepts self-pay patients.  [facility] [address] Laurell Josephs [place], [place]  [phone]   [facility] [address], [place] [phone]   [facility] Family Medicine [address], [place] [phone]   Renaye Rakers [address], [place]   [phone] Only accepts [place] patients after  they have their name applied to their card.   Self-Pay (no insurance) in [facility]:  Organization         Address  Phone   Notes  Sickle Cell Patients, [facility] Internal Medicine [address] [place], [place] [phone]   [facility] [address] [place], [place] [phone]   [facility] [place]  [address],  [phone]   [facility]/Dr. Osei-Bonsu  [address] [place] or [address], [place] [phone] Phone number for both [place] and [place] locations is the same.  Urgent Medical and [facility] [address] [place] [phone]   [facility] [address] [place] or [address] [phone]   [facility] [place] [address], [place] [phone], phone; [phone], fax Sees patients 1st and 3rd Saturday of every month.  Must not qualify for public or private insurance (i.e. Medicaid, Medicare, [place] Health Choice, Veterans' Benefits)  Household income should be no more than 200% of the poverty level The clinic cannot treat you if you are pregnant or think you are pregnant  Sexually transmitted diseases are not treated at the clinic.    Dental Care: Organization         Address  Phone  Notes  [facility] Department of [facility] [address] [place], [place] [phone] Accepts children up to age 5 who are enrolled in [place] or [place] Health Choice; pregnant women with a Medicaid card; and children who have applied for Medicaid or [place] Health Choice, but were declined, whose parents can pay a reduced fee at time of service.  [facility] Department of [facility]  [address], [place] [phone] Accepts children up to age 13 who are enrolled in [place] or [place] Health Choice; pregnant women with a Medicaid card; and children who  have applied for Medicaid or [place] Health Choice, but were declined, whose parents can pay a reduced fee at time  of service.  [facility]  [address], [place] [phone] Patients are seen by appointment only. Walk-ins are not accepted. [facility] will see patients 33 years of age and older. Monday - Tuesday (8am-5pm) Most Wednesdays (8:30-5pm) $30 per visit, cash only  [facility]  [address], [facility] [phone] Patients are seen by appointment only. Walk-ins are not accepted. [facility] will see patients 33 years of age and older. One Wednesday Evening (Monthly: Volunteer Based).  $30 per visit, cash only  [employer] [facility]  [phone]) [phone] for adults; Children under age 414, call Graduate Pediatric Dentistry at [phone]. Children aged 744-14, please call [phone] to request a pediatric application.  Dental services are provided in all areas of dental care including fillings, crowns and bridges, complete and partial dentures, implants, gum treatment, root canals, and extractions. Preventive care is also provided. Treatment is provided to both adults and children. Patients are selected via a lottery and there is often a waiting list.   [facility] [address], [place]  [phone] [email or web address]   [facility] [address], [place], [place] [phone], Ext. [phone] Second and Fourth Thursday of each month, opens at 6:30 AM; Clinic ends at 9 AM.  Patients are seen on a first-come first-served basis, and a limited number are seen during each clinic.   [facility]  [address], [place], [place] [phone]) [phone]   Eligibility Requirements You must have lived in [place], [place], or [place] counties for at least the last three months.   You cannot be eligible for state or federal sponsored [place]healthcare insurance, including [employer], [place]icaid, or [employer]icare.   You generally cannot be eligible for healthcare insurance through your employer.    How to apply: Eligibility screenings are held every Tuesday and Wednesday afternoon from 1:00 pm until 4:00 pm. You do not need an appointment for the interview!  [facility] [address], [place], [place] [phone]   [facility]  [phone]   [facility]  [phone]   [facility]  [phone]    Behavioral Health Resources in the Community: Intensive Outpatient Programs Organization         Address  Phone  Notes  [facility] [address], [place], [place] [phone]   [facility] [address], [place], [place] [phone]   [facility] [address], [place], [place]  [phone]   [facility] [address],  [place], [place] [phone] or [phone]   Substance Abuse Resources Organization         Address  Phone  Notes  [facility]  [phone]   [facility]  [phone]   [facility]  [phone]   [facility]  [phone]   Residential & Outpatient Substance Abuse Program  [phone]   Psychological Services Organization         Address  Phone  Notes  [facility]  [phone]- [phone]   [facility]  [phone]- [phone]   [facility] [address], [place] [phone] or [phone]    Mobile Crisis Teams Organization         Address  Phone  Notes  [facility], Mobile Crisis Care Unit  [phone]   [facility]  [address] [place]in[place], [place] [phone]   [facility] [address] [place] [phone]    Self-Help/Support Groups Organization         Address  Phone             Notes  [facility] [place] -  variety of support groups  336- I7437963 Call for more information  Narcotics Anonymous (NA), [facility] [address], [employer] [place]  2 meetings at this location   Statistician         Address  Phone  Notes  [facility] [address],    [place]  [phone]   [facility]  [address] [place] [address], [place], [place] [phone]   [facility] [address] [place], [place] [phone] Admissions: 8am-3pm M-F  [facility] [address]    [place], [place] [phone]   [facility] [address] [place], [place], [place] [phone]   [facility] [address]  [place], [place] [phone]   [facility] - Intensive Outpatient [address]ure[address], [place], [place] [phone]   [facility] ([facility] [address] [place]  [place], [place] [phone] or [phone]   [facility] ([facility]) [address] [place], [place] [phone] Accepts Medicaid  [facility] [address],  [place] [phone] Substance Abuse/Addiction Treatment   [facility] Organization         Address  Phone  Notes  [facility]  [phone]   Angie Fava, PhD [address] Ervin Knack [place], [place]   [phone] or [phone]   [facility] Behavioral   [address] [place], [place] [phone]   [facility] [address] [place], [place] [phone] Insurance/Medicaid/sponsorship through [facility] and Families [address]                                    [place], [place] [phone] Therapy/tele-psych/case  [facility] [address], [place] [phone]    Dr. Lolly Mustache  [phone]   [facility]  [employer] [facility] Dept. 1) [address] [place] 2) [address] [place] 3)  [address], [place] [phone]  [phone]   [facility] Child Abuse Hotline 3408163762 or [phone] (After Hours)

## 2013-06-30 NOTE — ED Provider Notes (Signed)
TIME SEEN: 11:16 AM  CHIEF COMPLAINT: Dental pain  HPI: Patient is a 33 y.o. M with history of hyperlipidemia, kidney stones, GERD, asthma who presents emergency department with right-sided dental pain. He has had similar pain intermittently for several weeks. He was seen here approximately one month ago and given clindamycin and pain medication which helped with his pain. He states today while eating he fractured his right upper molar and has had pain since. He has a dental appointment scheduled for June 12. No fevers, vomiting or diarrhea, drooling. No difficulty breathing, swallowing or speaking.  ROS: See HPI Constitutional: no fever  Eyes: no drainage  ENT: no runny nose   Cardiovascular:  no chest pain  Resp: no SOB  GI: no vomiting GU: no dysuria Integumentary: no rash  Allergy: no hives  Musculoskeletal: no leg swelling  Neurological: no slurred speech ROS otherwise negative  PAST MEDICAL HISTORY/PAST SURGICAL HISTORY:  Past Medical History  Diagnosis Date  . High cholesterol   . Difficult intubation   . Kidney calculi   . Kidney stones   . GERD (gastroesophageal reflux disease)   . Asthma     MEDICATIONS:  Prior to Admission medications   Medication Sig Start Date End Date Taking? Authorizing Provider  albuterol (PROVENTIL HFA;VENTOLIN HFA) 108 (90 BASE) MCG/ACT inhaler Inhale 2 puffs into the lungs every 2 (two) hours as needed for wheezing or shortness of breath (cough). 02/12/13 02/12/14  Elson AreasLeslie K Sofia, PA-C  chlorpheniramine-HYDROcodone [facility](TUSSIONEX PENNKINETIC ER) 10-8 MG/5ML LQCR Take 5 mLs by mouth every 12 (twelve) hours as needed for cough. 02/14/13   John L Molpus, MD  clindamycin (CLEOCIN) 150 MG capsule Take 1 capsule (150 mg total) by mouth every 6 (six) hours. 06/02/13   Elson AreasLeslie K Sofia, PA-C  esomeprazole (NEXIUM) 20 MG capsule Take 20 mg by mouth daily at 12 noon.    Historical Provider, MD  HYDROcodone-acetaminophen (NORCO/VICODIN) 5-325 MG per tablet Take 2  tablets by mouth every 4 (four) hours as needed. 06/02/13   Elson AreasLeslie K Sofia, PA-C  methocarbamol (ROBAXIN) 500 MG tablet Take 1 tablet (500 mg total) by mouth 2 (two) times daily. 11/26/12   Fayrene HelperBowie Tran, PA-C  metoCLOPramide (REGLAN) 10 MG tablet Take 1 tablet (10 mg total) by mouth every 6 (six) hours as needed for nausea or vomiting (nausea/headache). 02/14/13   John L Molpus, MD  naproxen (NAPROSYN) 500 MG tablet Take 1 tablet (500 mg total) by mouth 2 (two) times daily. 11/26/12   Fayrene HelperBowie Tran, PA-C  omeprazole (PRILOSEC) 10 MG capsule Take 10 mg by mouth daily. Patient uses this medication prn.    Historical Provider, MD  ondansetron (ZOFRAN) 4 MG tablet Take 1 tablet (4 mg total) by mouth every 8 (eight) hours as needed for nausea. 02/12/13   Elson AreasLeslie K Sofia, PA-C    ALLERGIES:  No Known Allergies  SOCIAL HISTORY:  History  Substance Use Topics  . Smoking status: Current Every Day Smoker -- 1.00 packs/day    Types: Cigarettes  . Smokeless tobacco: Never Used  . Alcohol Use: Yes     Comment: occasionally    FAMILY HISTORY: No family history on file.  EXAM: BP 181/90  Pulse 70  Temp(Src) 98.6 F (37 C) (Oral)  Resp 20  Ht 6' (1.829 m)  Wt 270 lb (122.471 kg)  BMI 36.61 kg/m2  SpO2 98% CONSTITUTIONAL: Alert and oriented and responds appropriately to questions. Well-appearing; well-nourished HEAD: Normocephalic EYES: Conjunctivae clear, PERRL ENT: normal nose; no  rhinorrhea; moist mucous membranes; pharynx without lesions noted, no trismus or drooling, no uvular deviation, no tonsillar hypertrophy or exudate, patient has multiple dental caries with no obvious sign of abscess, he has multiple fractured right upper molars and premolar that are mostly tender to palpation NECK: Supple, no meningismus, no LAD  CARD: RRR; S1 and S2 appreciated; no murmurs, no clicks, no rubs, no gallops RESP: Normal chest excursion without splinting or tachypnea; breath sounds clear and equal bilaterally;  no wheezes, no rhonchi, no rales,  ABD/GI: Normal bowel sounds; non-distended; soft, non-tender, no rebound, no guarding BACK:  The back appears normal and is non-tender to palpation, there is no CVA tenderness EXT: Normal ROM in all joints; non-tender to palpation; no edema; normal capillary refill; no cyanosis    SKIN: Normal color for age and race; warm NEURO: Moves all extremities equally PSYCH: The patient's mood and manner are appropriate. Grooming and personal hygiene are appropriate.  MEDICAL DECISION MAKING: Patient here with dental pain due to dental caries. No obvious abscess. He is requesting clindamycin for antibiotics for prophylaxis as he states it has helped him or in the past. We'll discharge Mr. prescription for hydrocodone. He has followup with his dentist on June 12. Have discussed return precautions and supportive care instructions. Patient verbalizes understanding is comfortable plan.  ED PROGRESS: Contacted by pharmacy. Patient unable to afford clindamycin. Will change to penicillin 500 mg 4 times a day for 10 days.     Layla Maw Ward, DO 06/30/13 [phone]

## 2013-06-30 NOTE — ED Notes (Signed)
MD at bedside. 

## 2013-10-12 ENCOUNTER — Encounter (HOSPITAL_BASED_OUTPATIENT_CLINIC_OR_DEPARTMENT_OTHER): Payer: Self-pay | Admitting: Emergency Medicine

## 2013-10-12 ENCOUNTER — Emergency Department (HOSPITAL_BASED_OUTPATIENT_CLINIC_OR_DEPARTMENT_OTHER): Payer: Self-pay

## 2013-10-12 ENCOUNTER — Emergency Department (HOSPITAL_BASED_OUTPATIENT_CLINIC_OR_DEPARTMENT_OTHER)
Admission: EM | Admit: 2013-10-12 | Discharge: 2013-10-12 | Disposition: A | Discharge status: Home / Self Care | Payer: Self-pay | Attending: Emergency Medicine | Admitting: Emergency Medicine

## 2013-10-12 DIAGNOSIS — R109 Unspecified abdominal pain: Secondary | ICD-10-CM | POA: Insufficient documentation

## 2013-10-12 DIAGNOSIS — M545 Low back pain, unspecified: Secondary | ICD-10-CM | POA: Insufficient documentation

## 2013-10-12 DIAGNOSIS — Z792 Long term (current) use of antibiotics: Secondary | ICD-10-CM | POA: Insufficient documentation

## 2013-10-12 DIAGNOSIS — E7800 Pure hypercholesterolemia, unspecified: Secondary | ICD-10-CM | POA: Insufficient documentation

## 2013-10-12 DIAGNOSIS — Z87442 Personal history of urinary calculi: Secondary | ICD-10-CM | POA: Insufficient documentation

## 2013-10-12 DIAGNOSIS — K219 Gastro-esophageal reflux disease without esophagitis: Secondary | ICD-10-CM | POA: Insufficient documentation

## 2013-10-12 DIAGNOSIS — F17200 Nicotine dependence, unspecified, uncomplicated: Secondary | ICD-10-CM | POA: Insufficient documentation

## 2013-10-12 DIAGNOSIS — J45909 Unspecified asthma, uncomplicated: Secondary | ICD-10-CM | POA: Insufficient documentation

## 2013-10-12 DIAGNOSIS — Z79899 Other long term (current) drug therapy: Secondary | ICD-10-CM | POA: Insufficient documentation

## 2013-10-12 LAB — URINALYSIS, ROUTINE W REFLEX MICROSCOPIC
BILIRUBIN URINE: NEGATIVE
Glucose, UA: NEGATIVE mg/dL
HGB URINE DIPSTICK: NEGATIVE
Ketones, ur: NEGATIVE mg/dL
Leukocytes, UA: NEGATIVE
Nitrite: NEGATIVE
PH: 6.5 (ref 5.0–8.0)
Protein, ur: NEGATIVE mg/dL
SPECIFIC GRAVITY, URINE: 1.018 (ref 1.005–1.030)
Urobilinogen, UA: 0.2 mg/dL (ref 0.0–1.0)

## 2013-10-12 MED ORDER — IBUPROFEN 800 MG PO TABS: 800.0000 mg | ORAL_TABLET | Freq: Three times a day (TID) | ORAL | Status: DC

## 2013-10-12 MED ORDER — HYDROCODONE-ACETAMINOPHEN 5-325 MG PO TABS: 2.0000 | ORAL_TABLET | ORAL | Status: DC | PRN

## 2013-10-12 MED ORDER — CYCLOBENZAPRINE HCL 10 MG PO TABS: 10.0000 mg | ORAL_TABLET | Freq: Two times a day (BID) | ORAL | Status: DC | PRN

## 2013-10-12 MED ORDER — HYDROMORPHONE HCL PF 1 MG/ML IJ SOLN
2.0000 mg | Freq: Once | INTRAMUSCULAR | Status: AC
  Administered 2013-10-12: 2 mg via INTRAMUSCULAR
  Filled 2013-10-12: qty 2

## 2013-10-12 MED ORDER — ONDANSETRON HCL 4 MG/2ML IJ SOLN
4.0000 mg | Freq: Once | INTRAMUSCULAR | Status: AC
  Administered 2013-10-12: 4 mg via INTRAMUSCULAR
  Filled 2013-10-12: qty 2

## 2013-10-12 NOTE — Discharge Instructions (Signed)
Back Pain, Adult Low back pain is very common. About 1 in 5 people have back pain.The cause of low back pain is rarely dangerous. The pain often gets better over time.About half of people with a sudden onset of back pain feel better in just 2 weeks. About 8 in 10 people feel better by 6 weeks.  CAUSES Some common causes of back pain include:  Strain of the muscles or ligaments supporting the spine.  Wear and tear (degeneration) of the spinal discs.  Arthritis.  Direct injury to the back. DIAGNOSIS Most of the time, the direct cause of low back pain is not known.However, back pain can be treated effectively even when the exact cause of the pain is unknown.Answering your caregiver's questions about your overall health and symptoms is one of the most accurate ways to make sure the cause of your pain is not dangerous. If your caregiver needs more information, he or she may order lab work or imaging tests (X-rays or MRIs).However, even if imaging tests show changes in your back, this usually does not require surgery. HOME CARE INSTRUCTIONS For many people, back pain returns.Since low back pain is rarely dangerous, it is often a condition that people can learn to manageon their own.   Remain active. It is stressful on the back to sit or stand in one place. Do not sit, drive, or stand in one place for more than 30 minutes at a time. Take short walks on level surfaces as soon as pain allows.Try to increase the length of time you walk each day.  Do not stay in bed.Resting more than 1 or 2 days can delay your recovery.  Do not avoid exercise or work.Your body is made to move.It is not dangerous to be active, even though your back may hurt.Your back will likely heal faster if you return to being active before your pain is gone.  Pay attention to your body when you bend and lift. Many people have less discomfortwhen lifting if they bend their knees, keep the load close to their bodies,and  avoid twisting. Often, the most comfortable positions are those that put less stress on your recovering back.  Find a comfortable position to sleep. Use a firm mattress and lie on your side with your knees slightly bent. If you lie on your back, put a pillow under your knees.  Only take over-the-counter or prescription medicines as directed by your caregiver. Over-the-counter medicines to reduce pain and inflammation are often the most helpful.Your caregiver may prescribe muscle relaxant drugs.These medicines help dull your pain so you can more quickly return to your normal activities and healthy exercise.  Put ice on the injured area.  Put ice in a plastic bag.  Place a towel between your skin and the bag.  Leave the ice on for 15-20 minutes, 03-04 times a day for the first 2 to 3 days. After that, ice and heat may be alternated to reduce pain and spasms.  Ask your caregiver about trying back exercises and gentle massage. This may be of some benefit.  Avoid feeling anxious or stressed.Stress increases muscle tension and can worsen back pain.It is important to recognize when you are anxious or stressed and learn ways to manage it.Exercise is a great option. SEEK MEDICAL CARE IF:  You have pain that is not relieved with rest or medicine.  You have pain that does not improve in 1 week.  You have new symptoms.  You are generally not feeling well. SEEK   IMMEDIATE MEDICAL CARE IF:   You have pain that radiates from your back into your legs.  You develop new bowel or bladder control problems.  You have unusual weakness or numbness in your arms or legs.  You develop nausea or vomiting.  You develop abdominal pain.  You feel faint. Document Released: 01/17/2005 Document Revised: 07/19/2011 Document Reviewed: 05/21/2013 Exit[employer] Patient Information 2015 [employer]. This information is not intended to replace advice given to you by your health care provider. Make sure you  discuss any questions you have with your health care provider.  

## 2013-10-12 NOTE — ED Notes (Signed)
Patient states 4 days ago began having left flank pain, on and off. This morning pain worsened, urinary frequency. Pain radiates to left lower abd.

## 2013-10-12 NOTE — ED Provider Notes (Signed)
CSN: [id number]     Arrival date & time 10/12/13  1209 History   First MD Initiated Contact with Patient 10/12/13 1311     Chief Complaint  Patient presents with  . Flank Pain     (Consider location/radiation/quality/duration/timing/severity/associated sxs/prior Treatment) Patient is a 33 y.o. male presenting with flank pain. The history is provided by the patient. No language interpreter was used.  Flank Pain This is a new problem. The current episode started today. The problem occurs constantly. The problem has been gradually worsening. Pertinent negatives include no fever or vomiting. Nothing aggravates the symptoms. The treatment provided moderate relief.   Pt complains of pain in his left back.   Past Medical History  Diagnosis Date  . High cholesterol   . Difficult intubation   . Kidney calculi   . Kidney stones   . GERD (gastroesophageal reflux disease)   . Asthma    Past Surgical History  Procedure Laterality Date  . Lithotripsy    . Cystoscopy w/ ureteral stent placement  12/18/2010    Procedure: CYSTOSCOPY WITH RETROGRADE PYELOGRAM/URETERAL STENT PLACEMENT;  Surgeon: Lindaann Slough, MD;  Location: [facility];  Service: Urology;  Laterality: Left;  left ureteroscopy   No family history on file. History  Substance Use Topics  . Smoking status: Current Every Day Smoker -- 1.00 packs/day    Types: Cigarettes  . Smokeless tobacco: Never Used  . Alcohol Use: Yes     Comment: occasionally    Review of Systems  Constitutional: Negative for fever.  Gastrointestinal: Negative for vomiting.  Genitourinary: Positive for flank pain.  All other systems reviewed and are negative.     Allergies  Review of patient's allergies indicates no known allergies.  Home Medications   Prior to Admission medications   Medication Sig Start Date End Date Taking? Authorizing Provider  albuterol (PROVENTIL HFA;VENTOLIN HFA) 108 (90 BASE) MCG/ACT inhaler Inhale 2 puffs into the lungs  every 2 (two) hours as needed for wheezing or shortness of breath (cough). 02/12/13 02/12/14  Elson Areas, PA-C  chlorpheniramine-HYDROcodone [facility] ER) 10-8 MG/5ML LQCR Take 5 mLs by mouth every 12 (twelve) hours as needed for cough. 02/14/13   John L Molpus, MD  clindamycin (CLEOCIN) 150 MG capsule Take 1 capsule (150 mg total) by mouth every 6 (six) hours. 06/02/13   Elson Areas, PA-C  clindamycin (CLEOCIN) 150 MG capsule Take 3 capsules (450 mg total) by mouth 3 (three) times daily. 06/30/13   Kristen N Ward, DO  esomeprazole (NEXIUM) 20 MG capsule Take 20 mg by mouth daily at 12 noon.    Historical Provider, MD  HYDROcodone-acetaminophen (NORCO/VICODIN) 5-325 MG per tablet Take 2 tablets by mouth every 4 (four) hours as needed. 06/02/13   Elson Areas, PA-C  HYDROcodone-acetaminophen (NORCO/VICODIN) 5-325 MG per tablet Take 1 tablet by mouth every 4 (four) hours as needed. 06/30/13   Kristen N Ward, DO  ibuprofen (ADVIL,MOTRIN) 800 MG tablet Take 1 tablet (800 mg total) by mouth every 8 (eight) hours as needed for mild pain. 06/30/13   Kristen N Ward, DO  methocarbamol (ROBAXIN) 500 MG tablet Take 1 tablet (500 mg total) by mouth 2 (two) times daily. 11/26/12   Fayrene Helper, PA-C  metoCLOPramide (REGLAN) 10 MG tablet Take 1 tablet (10 mg total) by mouth every 6 (six) hours as needed for nausea or vomiting (nausea/headache). 02/14/13   John L Molpus, MD  naproxen (NAPROSYN) 500 MG tablet Take 1 tablet (500 mg total)  by mouth 2 (two) times daily. 11/26/12   Fayrene Helper, PA-C  omeprazole (PRILOSEC) 10 MG capsule Take 10 mg by mouth daily. Patient uses this medication prn.    Historical Provider, MD  ondansetron (ZOFRAN) 4 MG tablet Take 1 tablet (4 mg total) by mouth every 8 (eight) hours as needed for nausea. 02/12/13   Elson Areas, PA-C   BP 121/71  Pulse 72  Temp(Src) 98.3 F (36.8 C) (Oral)  Resp 20  Ht 6' (1.829 m)  Wt 260 lb (117.935 kg)  BMI 35.25 kg/m2  SpO2 99% Physical  Exam  Nursing note and vitals reviewed. Constitutional: He is oriented to person, place, and time. He appears well-developed and well-nourished.  HENT:  Head: Normocephalic and atraumatic.  Eyes: Conjunctivae and EOM are normal. Pupils are equal, round, and reactive to light.  Neck: Normal range of motion.  Cardiovascular: Normal rate and normal heart sounds.   Pulmonary/Chest: Effort normal.  Abdominal: Soft. He exhibits no distension.  Musculoskeletal: Normal range of motion.  Neurological: He is alert and oriented to person, place, and time.  Skin: Skin is warm.  Psychiatric: He has a normal mood and affect.    ED Course  Procedures (including critical care time) Labs Review Labs Reviewed  URINALYSIS, ROUTINE W REFLEX MICROSCOPIC - Abnormal; Notable for the following:    APPearance CLOUDY (*)    All other components within normal limits    Imaging Review Ct Renal Stone Study  10/12/2013   CLINICAL DATA:  Left flank pain  EXAM: CT ABDOMEN AND PELVIS WITHOUT CONTRAST  TECHNIQUE: Multidetector CT imaging of the abdomen and pelvis was performed following the standard protocol without IV contrast.  COMPARISON:  07/15/2012  FINDINGS: The lung bases appear clear. There is no pericardial or pleural effusion noted.  No liver abnormality. The gallbladder appears normal. No biliary dilatation. The spleen is unremarkable.  The adrenal glands are both normal. The right kidney is on unremarkable. No right-sided nephrolithiasis or hydronephrosis. No right hydroureter identified.  Cyst within the upper pole of the left kidney is incompletely characterized without IV contrast measuring 2 cm. There is no left nephrolithiasis or hydronephrosis. No left hydroureter or ureteral lithiasis. The urinary bladder appears normal. Normal appearance of the prostate gland and seminal vesicles.  There is a normal caliber of the abdominal aorta. No aneurysm. No retroperitoneal adenopathy. There is no small bowel  mesenteric adenopathy. No pelvic or inguinal adenopathy identified.  The stomach appears normal. The small bowel loops have a normal course and caliber. There is no bowel obstruction. Normal appearance of the appendix. The terminal ileum is unremarkable. Normal appearance of the colon.  Review of the visualized osseous structures is notable for mild degenerative disc disease at L4-5.  IMPRESSION: 1. No acute findings within the abdomen or pelvis. 2. No evidence for kidney stones or obstructive uropathy.   Electronically Signed   By: Signa Kell M.D.   On: 10/12/2013 14:43     EKG Interpretation None      MDM   Final diagnoses:  Right-sided low back pain without sciatica    Ct no stone,   Pt does have some degenerative disc disease   Ibuprofen Hydrocodone Flexeril See your Md for recheck  Elson Areas, PA-C 10/12/13 1523

## 2013-10-13 NOTE — ED Provider Notes (Signed)
Medical screening examination/treatment/procedure(s) were performed by non-physician practitioner and as supervising physician I was immediately available for consultation/collaboration.   EKG Interpretation None       Doug Sou, MD 10/13/13 [phone]

## 2014-02-05 ENCOUNTER — Encounter (HOSPITAL_BASED_OUTPATIENT_CLINIC_OR_DEPARTMENT_OTHER): Payer: Self-pay

## 2014-02-05 ENCOUNTER — Emergency Department (HOSPITAL_BASED_OUTPATIENT_CLINIC_OR_DEPARTMENT_OTHER)
Admission: EM | Admit: 2014-02-05 | Discharge: 2014-02-05 | Disposition: A | Discharge status: Home / Self Care | Payer: Medicaid Other | Attending: Emergency Medicine | Admitting: Emergency Medicine

## 2014-02-05 DIAGNOSIS — M545 Low back pain: Secondary | ICD-10-CM

## 2014-02-05 DIAGNOSIS — Z79899 Other long term (current) drug therapy: Secondary | ICD-10-CM | POA: Diagnosis not present

## 2014-02-05 DIAGNOSIS — K219 Gastro-esophageal reflux disease without esophagitis: Secondary | ICD-10-CM | POA: Insufficient documentation

## 2014-02-05 DIAGNOSIS — Z720 Tobacco use: Secondary | ICD-10-CM | POA: Diagnosis not present

## 2014-02-05 DIAGNOSIS — J45909 Unspecified asthma, uncomplicated: Secondary | ICD-10-CM | POA: Insufficient documentation

## 2014-02-05 DIAGNOSIS — Z791 Long term (current) use of non-steroidal anti-inflammatories (NSAID): Secondary | ICD-10-CM | POA: Insufficient documentation

## 2014-02-05 DIAGNOSIS — E669 Obesity, unspecified: Secondary | ICD-10-CM | POA: Insufficient documentation

## 2014-02-05 DIAGNOSIS — Z87442 Personal history of urinary calculi: Secondary | ICD-10-CM | POA: Diagnosis not present

## 2014-02-05 DIAGNOSIS — M549 Dorsalgia, unspecified: Secondary | ICD-10-CM | POA: Diagnosis present

## 2014-02-05 HISTORY — DX: Other intervertebral disc degeneration, lumbar region: M51.36

## 2014-02-05 HISTORY — DX: Other intervertebral disc degeneration, lumbar region without mention of lumbar back pain or lower extremity pain: M51.369

## 2014-02-05 MED ORDER — IBUPROFEN 800 MG PO TABS: 800.0000 mg | ORAL_TABLET | Freq: Three times a day (TID) | ORAL | Status: DC | PRN

## 2014-02-05 NOTE — ED Provider Notes (Signed)
CSN: [id number]     Arrival date & time 02/05/14  1313 History   First MD Initiated Contact with Patient 02/05/14 1321     Chief Complaint  Patient presents with  . Back Pain     (Consider location/radiation/quality/duration/timing/severity/associated sxs/prior Treatment) HPI   34 year old male with history of degenerative disc disease, kidney stone who presents complaining of low back pain. Patient states that since September, which is 4 months ago he has been having recurrent low back pain. Describe pain as a sharp shooting sensation starting on both sides his low back that radiates to his midline. Pain worsening with movement especially with arching his back or turning side to side. He was initially seen in the ED for this complaint, and x-ray was performed and patient was diagnosed with degenerative disc disease. He was prescribed Tylenol which he has been taken is providing some relief. However for the past 3-4 days he has had worsening low back pain not relieved with Tylenol. He does not have insurance until next month and decided to come to the ER for further evaluation. He denies having fever, chills, abdominal pain, bowel bladder problem, numbness or weakness, or rash. No history of active cancer or IV drug use. Denies any recent injury. He is currently out of work. No other specific complaint. He is able to ambulate.    Past Medical History  Diagnosis Date  . High cholesterol   . Difficult intubation   . Kidney calculi   . Kidney stones   . GERD (gastroesophageal reflux disease)   . Asthma   . DDD (degenerative disc disease), lumbar    Past Surgical History  Procedure Laterality Date  . Lithotripsy    . Cystoscopy w/ ureteral stent placement  12/18/2010    Procedure: CYSTOSCOPY WITH RETROGRADE PYELOGRAM/URETERAL STENT PLACEMENT;  Surgeon: Lindaann Slough, MD;  Location: [facility];  Service: Urology;  Laterality: Left;  left ureteroscopy   No family history on file. History   Substance Use Topics  . Smoking status: Current Every Day Smoker -- 1.00 packs/day    Types: Cigarettes  . Smokeless tobacco: Never Used  . Alcohol Use: Yes     Comment: occasionally    Review of Systems  All other systems reviewed and are negative.     Allergies  Review of patient's allergies indicates no known allergies.  Home Medications   Prior to Admission medications   Medication Sig Start Date End Date Taking? Authorizing Provider  albuterol (PROVENTIL HFA;VENTOLIN HFA) 108 (90 BASE) MCG/ACT inhaler Inhale 2 puffs into the lungs every 2 (two) hours as needed for wheezing or shortness of breath (cough). 02/12/13 02/12/14  Elson Areas, PA-C  chlorpheniramine-HYDROcodone [facility] ER) 10-8 MG/5ML LQCR Take 5 mLs by mouth every 12 (twelve) hours as needed for cough. 02/14/13   John L Molpus, MD  clindamycin (CLEOCIN) 150 MG capsule Take 1 capsule (150 mg total) by mouth every 6 (six) hours. 06/02/13   Elson Areas, PA-C  clindamycin (CLEOCIN) 150 MG capsule Take 3 capsules (450 mg total) by mouth 3 (three) times daily. 06/30/13   Kristen N Ward, DO  cyclobenzaprine (FLEXERIL) 10 MG tablet Take 1 tablet (10 mg total) by mouth 2 (two) times daily as needed for muscle spasms. 10/12/13   Elson Areas, PA-C  esomeprazole (NEXIUM) 20 MG capsule Take 20 mg by mouth daily at 12 noon.    Historical Provider, MD  HYDROcodone-acetaminophen (NORCO/VICODIN) 5-325 MG per tablet Take 2 tablets by mouth  every 4 (four) hours as needed. 06/02/13   Elson AreasLeslie K Sofia, PA-C  HYDROcodone-acetaminophen (NORCO/VICODIN) 5-325 MG per tablet Take 1 tablet by mouth every 4 (four) hours as needed. 06/30/13   Kristen N Ward, DO  HYDROcodone-acetaminophen (NORCO/VICODIN) 5-325 MG per tablet Take 2 tablets by mouth every 4 (four) hours as needed. 10/12/13   Elson AreasLeslie K Sofia, PA-C  ibuprofen (ADVIL,MOTRIN) 800 MG tablet Take 1 tablet (800 mg total) by mouth every 8 (eight) hours as needed for mild pain.  06/30/13   Kristen N Ward, DO  ibuprofen (ADVIL,MOTRIN) 800 MG tablet Take 1 tablet (800 mg total) by mouth 3 (three) times daily. 10/12/13   Elson AreasLeslie K Sofia, PA-C  methocarbamol (ROBAXIN) 500 MG tablet Take 1 tablet (500 mg total) by mouth 2 (two) times daily. 11/26/12   Fayrene HelperBowie Shoichi Mielke, PA-C  metoCLOPramide (REGLAN) 10 MG tablet Take 1 tablet (10 mg total) by mouth every 6 (six) hours as needed for nausea or vomiting (nausea/headache). 02/14/13   John L Molpus, MD  naproxen (NAPROSYN) 500 MG tablet Take 1 tablet (500 mg total) by mouth 2 (two) times daily. 11/26/12   Fayrene HelperBowie Tanette Chauca, PA-C  omeprazole (PRILOSEC) 10 MG capsule Take 10 mg by mouth daily. Patient uses this medication prn.    Historical Provider, MD  ondansetron (ZOFRAN) 4 MG tablet Take 1 tablet (4 mg total) by mouth every 8 (eight) hours as needed for nausea. 02/12/13   Elson AreasLeslie K Sofia, PA-C   BP 139/96 mmHg  Pulse 74  Temp(Src) 98.2 F (36.8 C) (Oral)  Resp 18  Ht 6' (1.829 m)  Wt 284 lb (128.822 kg)  BMI 38.51 kg/m2  SpO2 98% Physical Exam  Constitutional: He appears well-developed and well-nourished. No distress.  Obese Caucasian male, sitting upright in bed appears to be in no acute distress.  HENT:  Head: Atraumatic.  Eyes: Conjunctivae are normal.  Neck: Normal range of motion. Neck supple.  Cardiovascular: Intact distal pulses.   Abdominal: Soft. There is no tenderness.  Musculoskeletal: He exhibits tenderness (Tenderness or lumbar patella most spinal muscle on palpation worsening with low back extension and rotation but no limited range of motion. No overlying skin changes.). He exhibits no edema.  Neurological: He is alert. He has normal reflexes.  Patellar deep tendon reflex intact bilaterally, no foot drop, patient able to ambulate.  Skin: No rash noted.  Psychiatric: He has a normal mood and affect.    ED Course  Procedures (including critical care time)  Patient here with recurrent low back pain. Pain without  radiculopathy. No red flags. Patient able to cannulate. No evidence to suggest infection. This pain is not likely to be kidney stones given the duration of symptoms and no complaints of dysuria, or hematuria. Plan to provide symptomatically treatment including rice therapy and orthopedic referral as needed.  Labs Review Labs Reviewed - No data to display  Imaging Review No results found.   EKG Interpretation None      MDM   Final diagnoses:  Low back pain without sciatica, unspecified back pain laterality    BP 139/96 mmHg  Pulse 74  Temp(Src) 98.2 F (36.8 C) (Oral)  Resp 18  Ht 6' (1.829 m)  Wt 284 lb (128.822 kg)  BMI 38.51 kg/m2  SpO2 98%     Fayrene HelperBowie Normajean Nash, PA-C 02/05/14 1348  Geoffery Lyonsouglas Delo, MD 02/05/14 1359

## 2014-02-05 NOTE — ED Notes (Signed)
C/o lower/mid back pain-denies injury-slow steady gait to tx area

## 2014-02-05 NOTE — ED Notes (Signed)
PA at bedside.

## 2014-02-05 NOTE — Discharge Instructions (Signed)
Back Exercises These exercises may help you when beginning to rehabilitate your injury. Your symptoms may resolve with or without further involvement from your physician, physical therapist or athletic trainer. While completing these exercises, remember:   Restoring tissue flexibility helps normal motion to return to the joints. This allows healthier, less painful movement and activity.  An effective stretch should be held for at least 30 seconds.  A stretch should never be painful. You should only feel a gentle lengthening or release in the stretched tissue. STRETCH - Extension, Prone on Elbows   Lie on your stomach on the floor, a bed will be too soft. Place your palms about shoulder width apart and at the height of your head.  Place your elbows under your shoulders. If this is too painful, stack pillows under your chest.  Allow your body to relax so that your hips drop lower and make contact more completely with the floor.  Hold this position for __________ seconds.  Slowly return to lying flat on the floor. Repeat __________ times. Complete this exercise __________ times per day.  RANGE OF MOTION - Extension, Prone Press Ups   Lie on your stomach on the floor, a bed will be too soft. Place your palms about shoulder width apart and at the height of your head.  Keeping your back as relaxed as possible, slowly straighten your elbows while keeping your hips on the floor. You may adjust the placement of your hands to maximize your comfort. As you gain motion, your hands will come more underneath your shoulders.  Hold this position __________ seconds.  Slowly return to lying flat on the floor. Repeat __________ times. Complete this exercise __________ times per day.  RANGE OF MOTION- Quadruped, Neutral Spine   Assume a hands and knees position on a firm surface. Keep your hands under your shoulders and your knees under your hips. You may place padding under your knees for  comfort.  Drop your head and point your tail bone toward the ground below you. This will round out your low back like an angry cat. Hold this position for __________ seconds.  Slowly lift your head and release your tail bone so that your back sags into a large arch, like an old horse.  Hold this position for __________ seconds.  Repeat this until you feel limber in your low back.  Now, find your "sweet spot." This will be the most comfortable position somewhere between the two previous positions. This is your neutral spine. Once you have found this position, tense your stomach muscles to support your low back.  Hold this position for __________ seconds. Repeat __________ times. Complete this exercise __________ times per day.  STRETCH - Flexion, Single Knee to Chest   Lie on a firm bed or floor with both legs extended in front of you.  Keeping one leg in contact with the floor, bring your opposite knee to your chest. Hold your leg in place by either grabbing behind your thigh or at your knee.  Pull until you feel a gentle stretch in your low back. Hold __________ seconds.  Slowly release your grasp and repeat the exercise with the opposite side. Repeat __________ times. Complete this exercise __________ times per day.  STRETCH - Hamstrings, Standing  Stand or sit and extend your right / left leg, placing your foot on a chair or foot stool  Keeping a slight arch in your low back and your hips straight forward.  Lead with your chest and   lean forward at the waist until you feel a gentle stretch in the back of your right / left knee or thigh. (When done correctly, this exercise requires leaning only a small distance.)  Hold this position for __________ seconds. Repeat __________ times. Complete this stretch __________ times per day. STRENGTHENING - Deep Abdominals, Pelvic Tilt   Lie on a firm bed or floor. Keeping your legs in front of you, bend your knees so they are both pointed  toward the ceiling and your feet are flat on the floor.  Tense your lower abdominal muscles to press your low back into the floor. This motion will rotate your pelvis so that your tail bone is scooping upwards rather than pointing at your feet or into the floor.  With a gentle tension and even breathing, hold this position for __________ seconds. Repeat __________ times. Complete this exercise __________ times per day.  STRENGTHENING - Abdominals, Crunches   Lie on a firm bed or floor. Keeping your legs in front of you, bend your knees so they are both pointed toward the ceiling and your feet are flat on the floor. Cross your arms over your chest.  Slightly tip your chin down without bending your neck.  Tense your abdominals and slowly lift your trunk high enough to just clear your shoulder blades. Lifting higher can put excessive stress on the low back and does not further strengthen your abdominal muscles.  Control your return to the starting position. Repeat __________ times. Complete this exercise __________ times per day.  STRENGTHENING - Quadruped, Opposite UE/LE Lift   Assume a hands and knees position on a firm surface. Keep your hands under your shoulders and your knees under your hips. You may place padding under your knees for comfort.  Find your neutral spine and gently tense your abdominal muscles so that you can maintain this position. Your shoulders and hips should form a rectangle that is parallel with the floor and is not twisted.  Keeping your trunk steady, lift your right hand no higher than your shoulder and then your left leg no higher than your hip. Make sure you are not holding your breath. Hold this position __________ seconds.  Continuing to keep your abdominal muscles tense and your back steady, slowly return to your starting position. Repeat with the opposite arm and leg. Repeat __________ times. Complete this exercise __________ times per day. Document Released:  02/04/2005 Document Revised: 04/11/2011 Document Reviewed: 05/01/2008 ExitCare Patient Information 2015 [employer]. This information is not intended to replace advice given to you by your health care provider. Make sure you discuss any questions you have with your health care provider.  

## 2014-05-19 ENCOUNTER — Emergency Department (HOSPITAL_BASED_OUTPATIENT_CLINIC_OR_DEPARTMENT_OTHER)
Admission: EM | Admit: 2014-05-19 | Discharge: 2014-05-19 | Disposition: A | Discharge status: Home / Self Care | Payer: Medicaid Other | Attending: Emergency Medicine | Admitting: Emergency Medicine

## 2014-05-19 ENCOUNTER — Encounter (HOSPITAL_BASED_OUTPATIENT_CLINIC_OR_DEPARTMENT_OTHER): Payer: Self-pay

## 2014-05-19 DIAGNOSIS — M5431 Sciatica, right side: Secondary | ICD-10-CM | POA: Diagnosis not present

## 2014-05-19 DIAGNOSIS — Z87442 Personal history of urinary calculi: Secondary | ICD-10-CM | POA: Insufficient documentation

## 2014-05-19 DIAGNOSIS — Z720 Tobacco use: Secondary | ICD-10-CM | POA: Diagnosis not present

## 2014-05-19 DIAGNOSIS — Z8639 Personal history of other endocrine, nutritional and metabolic disease: Secondary | ICD-10-CM | POA: Insufficient documentation

## 2014-05-19 DIAGNOSIS — K219 Gastro-esophageal reflux disease without esophagitis: Secondary | ICD-10-CM | POA: Diagnosis not present

## 2014-05-19 DIAGNOSIS — M79604 Pain in right leg: Secondary | ICD-10-CM | POA: Diagnosis present

## 2014-05-19 DIAGNOSIS — J45909 Unspecified asthma, uncomplicated: Secondary | ICD-10-CM | POA: Diagnosis not present

## 2014-05-19 HISTORY — DX: Dorsalgia, unspecified: M54.9

## 2014-05-19 MED ORDER — CYCLOBENZAPRINE HCL 10 MG PO TABS: 10.0000 mg | ORAL_TABLET | Freq: Three times a day (TID) | ORAL | Status: DC | PRN

## 2014-05-19 MED ORDER — IBUPROFEN 800 MG PO TABS
800.0000 mg | ORAL_TABLET | Freq: Once | ORAL | Status: AC
  Administered 2014-05-19: 800 mg via ORAL
  Filled 2014-05-19: qty 1

## 2014-05-19 MED ORDER — IBUPROFEN 800 MG PO TABS: 800.0000 mg | ORAL_TABLET | Freq: Three times a day (TID) | ORAL | Status: DC | PRN

## 2014-05-19 MED ORDER — CYCLOBENZAPRINE HCL 10 MG PO TABS
10.0000 mg | ORAL_TABLET | Freq: Once | ORAL | Status: AC
  Administered 2014-05-19: 10 mg via ORAL
  Filled 2014-05-19: qty 1

## 2014-05-19 NOTE — Discharge Instructions (Signed)
Sciatica °Sciatica is pain, weakness, numbness, or tingling along the path of the sciatic nerve. The nerve starts in the lower back and runs down the back of each leg. The nerve controls the muscles in the lower leg and in the back of the knee, while also providing sensation to the back of the thigh, lower leg, and the sole of your foot. Sciatica is a symptom of another medical condition. For instance, nerve damage or certain conditions, such as a herniated disk or bone spur on the spine, pinch or put pressure on the sciatic nerve. This causes the pain, weakness, or other sensations normally associated with sciatica. Generally, sciatica only affects one side of the body. °CAUSES  °· Herniated or slipped disc. °· Degenerative disk disease. °· A pain disorder involving the narrow muscle in the buttocks (piriformis syndrome). °· Pelvic injury or fracture. °· Pregnancy. °· Tumor (rare). °SYMPTOMS  °Symptoms can vary from mild to very severe. The symptoms usually travel from the low back to the buttocks and down the back of the leg. Symptoms can include: °· Mild tingling or dull aches in the lower back, leg, or hip. °· Numbness in the back of the calf or sole of the foot. °· Burning sensations in the lower back, leg, or hip. °· Sharp pains in the lower back, leg, or hip. °· Leg weakness. °· Severe back pain inhibiting movement. °These symptoms may get worse with coughing, sneezing, laughing, or prolonged sitting or standing. Also, being overweight may worsen symptoms. °DIAGNOSIS  °Your caregiver will perform a physical exam to look for common symptoms of sciatica. He or she may ask you to do certain movements or activities that would trigger sciatic nerve pain. Other tests may be performed to find the cause of the sciatica. These may include: °· Blood tests. °· X-rays. °· Imaging tests, such as an MRI or CT scan. °TREATMENT  °Treatment is directed at the cause of the sciatic pain. Sometimes, treatment is not necessary  and the pain and discomfort goes away on its own. If treatment is needed, your caregiver may suggest: °· Over-the-counter medicines to relieve pain. °· Prescription medicines, such as anti-inflammatory medicine, muscle relaxants, or narcotics. °· Applying heat or ice to the painful area. °· Steroid injections to lessen pain, irritation, and inflammation around the nerve. °· Reducing activity during periods of pain. °· Exercising and stretching to strengthen your abdomen and improve flexibility of your spine. Your caregiver may suggest losing weight if the extra weight makes the back pain worse. °· Physical therapy. °· Surgery to eliminate what is pressing or pinching the nerve, such as a bone spur or part of a herniated disk. °HOME CARE INSTRUCTIONS  °· Only take over-the-counter or prescription medicines for pain or discomfort as directed by your caregiver. °· Apply ice to the affected area for 20 minutes, 3-4 times a day for the first 48-72 hours. Then try heat in the same way. °· Exercise, stretch, or perform your usual activities if these do not aggravate your pain. °· Attend physical therapy sessions as directed by your caregiver. °· Keep all follow-up appointments as directed by your caregiver. °· Do not wear high heels or shoes that do not provide proper support. °· Check your mattress to see if it is too soft. A firm mattress may lessen your pain and discomfort. °SEEK IMMEDIATE MEDICAL CARE IF:  °· You lose control of your bowel or bladder (incontinence). °· You have increasing weakness in the lower back, pelvis, buttocks,   or legs. °· You have redness or swelling of your back. °· You have a burning sensation when you urinate. °· You have pain that gets worse when you lie down or awakens you at night. °· Your pain is worse than you have experienced in the past. °· Your pain is lasting longer than 4 weeks. °· You are suddenly losing weight without reason. °MAKE SURE YOU: °· Understand these  instructions. °· Will watch your condition. °· Will get help right away if you are not doing well or get worse. °Document Released: 01/11/2001 Document Revised: 07/19/2011 Document Reviewed: 05/29/2011 °ExitCare® Patient Information ©2015 [employer]. This information is not intended to replace advice given to you by your health care provider. Make sure you discuss any questions you have with your health care provider. ° °Emergency Department Resource Guide °1) Find a Doctor and Pay Out of Pocket °Although you won't have to find out who is covered by your insurance plan, it is a good idea to ask around and get recommendations. You will then need to call the office and see if the doctor you have chosen will accept you as a new patient and what types of options they offer for patients who are self-pay. Some doctors offer discounts or will set up payment plans for their patients who do not have insurance, but you will need to ask so you aren't surprised when you get to your appointment. ° °2) Contact Your Local Health Department °Not all health departments have doctors that can see patients for sick visits, but many do, so it is worth a call to see if yours does. If you don't know where your local health department is, you can check in your phone book. The CDC also has a tool to help you locate your state's health department, and many state websites also have listings of all of their local health departments. ° °3) Find a Walk-in Clinic °If your illness is not likely to be very severe or complicated, you may want to try a walk in clinic. These are popping up all over the country in pharmacies, drugstores, and shopping centers. They're usually staffed by nurse practitioners or physician assistants that have been trained to treat common illnesses and complaints. They're usually fairly quick and inexpensive. However, if you have serious medical issues or chronic medical problems, these are probably not your best  option. ° °No Primary Care Doctor: °- Call [facility] at  832-8000 - they can help you locate a primary care doctor that  accepts your insurance, provides certain services, etc. °- Physician Referral Service- 1-800-533-3463 ° °Chronic Pain Problems: °Organization         Address  Phone   Notes  °[facility]  (336) 297-2271 Patients need to be referred by their primary care doctor.  ° °Medication Assistance: °Organization         Address  Phone   Notes  °[place] [facility] [address] °[place], [place] [address] (336) 641-8030 --Must be a resident of [place] °-- Must have NO insurance coverage whatsoever (no Medicaid/ Medicare, etc.) °-- The pt. MUST have a primary care doctor that directs their care regularly and follows them in the community °  °[facility]  (866) 331-1348   °[employer]  (888) 892-1162   ° °Agencies that provide inexpensive medical care: °Organization         Address  Phone   Notes  °[facility]  (  336) 832-8035   °[facility]    (336) 832-7272   °[facility] [address] °[place], [place] [address] (336) 832-4777   °[facility] [address], °[place] (336) 271-4999   °[facility]    (336) 373-0678   °[facility]    (336) 272-1050   °[facility] ° [address], [place] Phone:  (336) 832-4444, Fax:  (336) 832-4440 Hours of Operation:  9 am - 6 pm, M-F.  Also accepts Medicaid/Medicare and self-pay.  °[facility] ° [address], [place] Phone: (336) 832-3150, Fax: (336) [phone]. Hours of Operation:  8:30 am - 5:30 pm, M-F.  Also accepts Medicaid and self-pay.  °[facility] [place] [address], [place] Phone: (336) 878-6027   °[facility] [address], [place], [place] (336)723-1848, Ext. 123 Mondays & Thursdays: 7-9 AM.  First 15  patients are seen on a first come, first serve basis. °  ° °Medicaid-accepting [place] Providers: ° °Organization         Address  Phone   Notes  °[facility] [address], [place] (336) 641-2100 Also accepts self-pay patients.  °[facility] [address], [place] ° (336) 856-9996   °[facility] [address], [place] (336) 288-8857   °[facility] [address] [place] [address], [place] (336) 299-7000   °Veita Bland [address], [place]  ° (336) 373-1557 Only accepts [place] Access Medicaid patients after they have their name applied to their card.  ° °Self-Pay (no insurance) in [place]: ° °Organization         Address  Phone   Notes  °Sickle Cell Patients, [facility] [address], [place] (336) 832-1970   °[facility] [address], [place] (336) 832-4400   °[facility] [facility] ° [address], [place] (336) 992-4800   °[facility]/Dr. Osei-Bonsu ° [address] [place] [address], [place] or [address], [place] (336) [phone] Phone number for both [place] and [place] locations is the same.  °[facility] [address], [place] (336) 299-0000   °[facility] [address] [place] [address], [place] or [address] (336) 852-7530 °(336) 878-2260   °[facility] [address], [place] (336) 350-1642, phone; (336) 294-5005, fax Sees patients 1st and 3rd Saturday of every month.  Must not qualify for public or private insurance (i.e. Medicaid, Medicare, [place] Health Choice, Veterans' Benefits) • Household income should be no more than 200% of the poverty level •The clinic cannot treat you if you are pregnant or think you are pregnant • Sexually transmitted diseases are not treated at the clinic.  ° ° °Dental  Care: °Organization         Address  Phone  Notes  °[facility] [address], [place] (336) 641-6152 Accepts children up to age 21 who are enrolled in Medicaid or [place] Health Choice; pregnant women with a Medicaid card; and children who have applied for Medicaid or  Health Choice, but were declined, whose parents can pay a reduced fee at time of service.  °[facility] [place]    [address], [place] (336) 641-7733 Accepts children up to age 21 who are enrolled in Medicaid or [place] Health Choice; pregnant women with a Medicaid card; and children who have applied for Medicaid or [place] Health Choice, but were declined, whose parents can pay a reduced fee at time of service.  °[facility] ° [address], [place] (336) 641-4533 Patients are seen by appointment only. Walk-ins are not accepted. [facility] will see patients 18 years of age and older. °Monday - Tuesday (8am-5pm) °Most Wednesdays (8:30-5pm) °$30 per visit, cash only  °[facility] ° [address], [place] (336) 641-4533 Patients are seen by appointment only. Walk-ins are not accepted. [facility] will see patients 18 years of age and older. °One Wednesday Evening (Monthly: Volunteer Based).  $30 per visit, cash only  °[facility]  (919) 537-3737 for adults; Children under age 4, call Graduate Pediatric Dentistry at ([phone]) [phone]. Children aged 4-14, please call ([phone]) [phone] to request a pediatric application. ° Dental services are provided in all areas of dental care including fillings, crowns and bridges, complete and partial dentures, implants, gum treatment, root canals, and extractions. Preventive care is also provided. Treatment is provided to both adults and children. °Patients are selected via a lottery and there is often a waiting list. °  °[facility] [address], °[place] ° (336) 763-8833 [email or web address] °  °[facility] [address], [place], [place] (336)723-1848, Ext. 123 Second and Fourth Thursday of each month, opens at 6:30 AM; Clinic ends at 9 AM.  Patients are seen on a first-come first-served basis, and a limited number are seen during each clinic.  ° °[facility] ° [address], [place], [place] (336) 723-7904   Eligibility Requirements °You must have lived in [place], [place], or [place] counties for at least the last three months. °  You cannot be eligible for state or federal sponsored healthcare insurance, including Veterans Administration, Medicaid, or Medicare. °  You generally cannot be eligible for healthcare insurance through your employer.  °  How to apply: °Eligibility screenings are held every Tuesday and Wednesday afternoon from 1:00 pm until 4:00 pm. You do not need an appointment for the interview!  °[facility] [address], [place], [place] 336-631-2330   °[facility]  336-342-8273   °[place] [facility]  336-703-3100   °[facility]  336-570-6415   ° °Behavioral Health Resources in the Community: °Intensive Outpatient Programs °Organization         Address  Phone  Notes  °[facility] [address], [place], [place] 336-878-6098   °[facility] [address], [place], [place] 336-832-9800   °[facility] [address], [place], [place] ° 336-882-2125   °[facility] [address],  °, [place] 1-800-853-5163 or 336-641-4981   °Substance Abuse Resources °Organization         Address  Phone  Notes  °[facility]  336-882-2125   °[facility]  336-784-9470   °[facility]  336-285-9073   °[facility]  336-845-3988   °Residential & Outpatient Substance Abuse Program  1-800-659-3381    °Psychological Services °Organization         Address  Phone  Notes  °[facility]  336- 832-9600   °  [facility]  336- 378-7881   °[facility] [address], [place] 1-800-853-5163 or 336-641-4981   ° °Mobile Crisis Teams °Organization         Address  Phone  Notes  °[facility], Mobile Crisis Care Unit  1-877-626-1772   °Assertive °[facility] ° [address] [place], [place] 336-834-9664   °Sharon DeEsch [address] °[place] 336-554-5454   ° °Self-Help/Support Groups °Organization         Address  Phone             Notes  °[facility] - variety of support groups  336- 373-1402 Call for more information  °Narcotics Anonymous (NA), [facility] [address], °[place]  2 meetings at this location  ° °Residential Treatment Programs °Organization         Address  Phone  Notes  °[facility] [address],    °[place]  1-866-801-8205   °[facility] ° [address], [place], [place] 704-293-8524   °[facility] [address], [place] 336-845-3988 Admissions: 8am-3pm M-F  °[facility] [address]    °[place], [place] 336-841-1104   °[facility] [address], [place], [place] 336-379-7146   °[facility] [address]  °[place], [place] 336-285-9073   °[facility] - Intensive Outpatient [address], [place], [place] 336-852-3033   °[facility] [address]  °[place], [place] 1-877-615-2722 or 336-784-9470   °[facility] ([facility] [address] [place], [place] 336-227-7417 Accepts Medicaid  °[facility] [address]  ° [place] 1-800-659-3381 Substance Abuse/Addiction Treatment  ° °[place] Behavioral Health Resources °Organization         Address  Phone  Notes  °[facility]  (888) 581-9988   °Julie Brannon, PhD [address] [place], [place]   (336) 349-5553 or (336) 951-0000   °[facility]   [address] °[place], [place] (336) 349-4454   °[facility] [address], [place], [place] (336) 342-8316 Insurance/Medicaid/sponsorship through [facility]  °[facility] [address]                                    [place], [place] (336) 342-8316 Therapy/tele-psych/case  °[facility] [address]  ° [place], [place] (336) 349-2233    °Dr. Arfeen  (336) 349-4544   °[facility] [place]  [employer] [place] [facility] 1) [address], [place] °2) [address], [place] °3)  [address], [place] (336) 349-3220 °(336) 342-7768 ° °(336) 342-8140   °[place] [facility] (336) 342-1394 or (336) 342-3537 (After Hours)    ° ° °

## 2014-05-19 NOTE — ED Notes (Signed)
C/o right leg pain x1.5 months-states he was advised to be concerned due to hx back pain-steady gait

## 2014-05-19 NOTE — ED Provider Notes (Signed)
CSN: [id number]     Arrival date & time 05/19/14  1115 History   First MD Initiated Contact with Patient 05/19/14 1254     Chief Complaint  Patient presents with  . Leg Pain     (Consider location/radiation/quality/duration/timing/severity/associated sxs/prior Treatment) Patient is a 34 y.o. male presenting with leg pain. The history is provided by the patient.  Leg Pain Location:  Leg Time since incident:  2 months Injury: no   Leg location:  R upper leg Pain details:    Quality:  Burning and shooting   Radiates to: to upper leg.   Severity:  Moderate   Onset quality:  Gradual   Duration:  2 months   Timing:  Constant   Progression:  Unchanged Chronicity:  Recurrent Dislocation: no   Relieved by:  Rest Worsened by:  Nothing tried Associated symptoms: no fever     Past Medical History  Diagnosis Date  . High cholesterol   . Difficult intubation   . Kidney calculi   . Kidney stones   . GERD (gastroesophageal reflux disease)   . Asthma   . DDD (degenerative disc disease), lumbar   . Back pain   . DDD (degenerative disc disease), lumbar    Past Surgical History  Procedure Laterality Date  . Lithotripsy    . Cystoscopy w/ ureteral stent placement  12/18/2010    Procedure: CYSTOSCOPY WITH RETROGRADE PYELOGRAM/URETERAL STENT PLACEMENT;  Surgeon: Lindaann SloughMarc-Henry Nesi, MD;  Location: [facility];  Service: Urology;  Laterality: Left;  left ureteroscopy   No family history on file. History  Substance Use Topics  . Smoking status: Current Every Day Smoker -- 1.00 packs/day    Types: Cigarettes  . Smokeless tobacco: Never Used  . Alcohol Use: Yes     Comment: occasionally    Review of Systems  Constitutional: Negative for fever.  Respiratory: Negative for cough and shortness of breath.   Gastrointestinal: Negative for vomiting and abdominal pain.  All other systems reviewed and are negative.     Allergies  Review of patient's allergies indicates no known  allergies.  Home Medications   Prior to Admission medications   Medication Sig Start Date End Date Taking? Authorizing Provider  cyclobenzaprine (FLEXERIL) 10 MG tablet Take 1 tablet (10 mg total) by mouth 3 (three) times daily as needed for muscle spasms. 05/19/14   Elwin MochaBlair Jarmar Rousseau, MD  esomeprazole (NEXIUM) 20 MG capsule Take 20 mg by mouth daily at 12 noon.    Historical Provider, MD  ibuprofen (ADVIL,MOTRIN) 800 MG tablet Take 1 tablet (800 mg total) by mouth every 8 (eight) hours as needed for moderate pain. 02/05/14   Fayrene HelperBowie Tran, PA-C  ibuprofen (ADVIL,MOTRIN) 800 MG tablet Take 1 tablet (800 mg total) by mouth every 8 (eight) hours as needed for moderate pain. 05/19/14   Elwin MochaBlair Luise Yamamoto, MD  omeprazole (PRILOSEC) 10 MG capsule Take 10 mg by mouth daily. Patient uses this medication prn.    Historical Provider, MD   BP 149/93 mmHg  Pulse 100  Temp(Src) 98.1 F (36.7 C) (Oral)  Resp 20  Ht 6' (1.829 m)  Wt 285 lb (129.275 kg)  BMI 38.64 kg/m2  SpO2 98% Physical Exam  Constitutional: He is oriented to person, place, and time. He appears well-developed and well-nourished. No distress.  HENT:  Head: Normocephalic and atraumatic.  Mouth/Throat: Oropharynx is clear and moist. No oropharyngeal exudate.  Eyes: EOM are normal. Pupils are equal, round, and reactive to light.  Neck: Normal range of  motion. Neck supple.  Cardiovascular: Normal rate and regular rhythm.  Exam reveals no friction rub.   No murmur heard. Pulmonary/Chest: Effort normal and breath sounds normal. No respiratory distress. He has no wheezes. He has no rales.  Abdominal: Soft. He exhibits no distension. There is no tenderness. There is no rebound.  Musculoskeletal: Normal range of motion. He exhibits no edema.  Neurological: He is alert and oriented to person, place, and time. No cranial nerve deficit or sensory deficit. He exhibits normal muscle tone. Coordination normal. GCS eye subscore is 4. GCS verbal subscore is 5. GCS  motor subscore is 6.  Reflex Scores:      Patellar reflexes are 2+ on the right side and 2+ on the left side. Skin: No rash noted. He is not diaphoretic.  Nursing note and vitals reviewed.   ED Course  Procedures (including critical care time) Labs Review Labs Reviewed - No data to display  Imaging Review No results found.   EKG Interpretation None      MDM   Final diagnoses:  Right sided sciatica    35 year old male here with right leg pain. Stated as burning, sharp, shooting of the right lateral leg. It's moving up the right leg. Worsening over the past 2 months since he started working and is on his feet all the time. No loss of bladder or bowel control. No saddle anesthesia. No numbness, T9, weakness in the legs. No fevers. He states occasionally has lower back pain and stated this started with back pain. I think this is likely sciatica. Normal reflexes, normal neuro exam here. We'll give Flexeril and Motrin. Given resource guide for follow-up.    Elwin Mocha, MD 05/19/14 1340

## 2017-04-14 ENCOUNTER — Other Ambulatory Visit: Payer: Self-pay

## 2017-04-14 ENCOUNTER — Emergency Department (HOSPITAL_BASED_OUTPATIENT_CLINIC_OR_DEPARTMENT_OTHER): Payer: BLUE CROSS/BLUE SHIELD

## 2017-04-14 ENCOUNTER — Encounter (HOSPITAL_BASED_OUTPATIENT_CLINIC_OR_DEPARTMENT_OTHER): Payer: Self-pay | Admitting: Emergency Medicine

## 2017-04-14 ENCOUNTER — Emergency Department (HOSPITAL_BASED_OUTPATIENT_CLINIC_OR_DEPARTMENT_OTHER)
Admission: EM | Admit: 2017-04-14 | Discharge: 2017-04-14 | Disposition: A | Discharge status: Home / Self Care | Payer: BLUE CROSS/BLUE SHIELD | Attending: Emergency Medicine | Admitting: Emergency Medicine

## 2017-04-14 DIAGNOSIS — R1011 Right upper quadrant pain: Secondary | ICD-10-CM | POA: Insufficient documentation

## 2017-04-14 DIAGNOSIS — J45909 Unspecified asthma, uncomplicated: Secondary | ICD-10-CM | POA: Diagnosis not present

## 2017-04-14 DIAGNOSIS — R1013 Epigastric pain: Secondary | ICD-10-CM | POA: Diagnosis not present

## 2017-04-14 DIAGNOSIS — Z79899 Other long term (current) drug therapy: Secondary | ICD-10-CM | POA: Insufficient documentation

## 2017-04-14 DIAGNOSIS — R0789 Other chest pain: Secondary | ICD-10-CM | POA: Diagnosis present

## 2017-04-14 DIAGNOSIS — F17210 Nicotine dependence, cigarettes, uncomplicated: Secondary | ICD-10-CM | POA: Insufficient documentation

## 2017-04-14 LAB — HEPATIC FUNCTION PANEL
ALK PHOS: 77 U/L (ref 38–126)
ALT: 29 U/L (ref 17–63)
AST: 33 U/L (ref 15–41)
Albumin: 3.9 g/dL (ref 3.5–5.0)
BILIRUBIN DIRECT: 0.1 mg/dL (ref 0.1–0.5)
BILIRUBIN INDIRECT: 1 mg/dL — AB (ref 0.3–0.9)
BILIRUBIN TOTAL: 1.1 mg/dL (ref 0.3–1.2)
Total Protein: 7.2 g/dL (ref 6.5–8.1)

## 2017-04-14 LAB — CBC
HCT: 45.3 % (ref 39.0–52.0)
Hemoglobin: 14.9 g/dL (ref 13.0–17.0)
MCH: 29.6 pg (ref 26.0–34.0)
MCHC: 32.9 g/dL (ref 30.0–36.0)
MCV: 89.9 fL (ref 78.0–100.0)
Platelets: 271 10*3/uL (ref 150–400)
RBC: 5.04 MIL/uL (ref 4.22–5.81)
RDW: 14.2 % (ref 11.5–15.5)
WBC: 7.6 10*3/uL (ref 4.0–10.5)

## 2017-04-14 LAB — BASIC METABOLIC PANEL
Anion gap: 9 (ref 5–15)
BUN: 21 mg/dL — ABNORMAL HIGH (ref 6–20)
CALCIUM: 9 mg/dL (ref 8.9–10.3)
CO2: 25 mmol/L (ref 22–32)
CREATININE: 1.2 mg/dL (ref 0.61–1.24)
Chloride: 106 mmol/L (ref 101–111)
GFR calc Af Amer: >60 mL/min (ref >60)
GFR calc non Af Amer: >60 mL/min (ref >60)
Glucose, Bld: 162 mg/dL — ABNORMAL HIGH (ref 65–99)
Potassium: 3.7 mmol/L (ref 3.5–5.1)
Sodium: 140 mmol/L (ref 135–145)

## 2017-04-14 LAB — TROPONIN I: Troponin I: <0.03 ng/mL (ref <0.03)

## 2017-04-14 LAB — LIPASE, BLOOD: Lipase: 27 U/L (ref 11–51)

## 2017-04-14 MED ORDER — GI COCKTAIL ~~LOC~~
30.0000 mL | Freq: Once | ORAL | Status: AC
  Administered 2017-04-14: 30 mL via ORAL
  Filled 2017-04-14: qty 30

## 2017-04-14 MED ORDER — SODIUM CHLORIDE 0.9 % IV BOLUS (SEPSIS)
1000.0000 mL | Freq: Once | INTRAVENOUS | Status: AC
  Administered 2017-04-14: 1000 mL via INTRAVENOUS

## 2017-04-14 MED ORDER — HYDROMORPHONE HCL 1 MG/ML IJ SOLN
1.0000 mg | Freq: Once | INTRAMUSCULAR | Status: AC
  Administered 2017-04-14: 1 mg via INTRAVENOUS
  Filled 2017-04-14: qty 1

## 2017-04-14 MED ORDER — ONDANSETRON HCL 4 MG/2ML IJ SOLN
4.0000 mg | Freq: Once | INTRAMUSCULAR | Status: AC
Start: 2017-04-14 — End: 2017-04-14
  Administered 2017-04-14: 4 mg via INTRAVENOUS
  Filled 2017-04-14: qty 2

## 2017-04-14 NOTE — ED Provider Notes (Signed)
[facility] EMERGENCY DEPARTMENT Provider Note   CSN: [id number] Arrival date & time: 04/14/17  1921     History   Chief Complaint Chief Complaint  Patient presents with  . Chest Pain    HPI Frank Robinson is a 37 y.o. male.  Patient is a 37 year old male with a history of kidney stones, GERD and high cholesterol presenting today with development of severe pain in his upper abdomen that radiates into his chest.  Patient states he had some moderate pain that felt like a gas bubble this morning around 5 AM when he woke up but it resolved spontaneously and he went to work.  He was fine all day at work ate lunch without difficulty and then about 5:30 PM he drank a [facility] and developed sudden severe pain in his upper abdomen that radiated into his chest and shoulder.  This pain lasted approximately 25 minutes and resolved.  When he got home he took a shower and then ate a bowl of ice cream and the pain returned and is been persistent for the last 2 hours.  He states the pain is constant at a 7 or 8 out of 10 but then he will get waves of stronger pain up to a 10 out of 10 that is unbearable.  He denies any cough or shortness of breath.  He has not had fever but does state he has had loose stools all day.  He states he has been losing weight and has lost 35 pounds in the last few months and occasionally he will feel a band across his upper abdomen and bloating with eating but he has never experienced pain like this.  He does not drink alcohol regularly and does not take excessive amounts of ibuprofen or aspirin.  He denies prior abdominal surgeries.   The history is provided by the patient.  Abdominal Pain   This is a new problem. The current episode started 6 to 12 hours ago. The problem occurs constantly. The problem has not changed since onset.The pain is associated with eating. The pain is located in the RUQ and epigastric region (radiates to the left shoulder and back). The  quality of the pain is cramping, shooting and sharp. The pain is at a severity of 8/10. The pain is severe. Associated symptoms include anorexia and diarrhea. Pertinent negatives include nausea, vomiting, dysuria and frequency. The symptoms are aggravated by eating. Nothing relieves the symptoms.    Past Medical History:  Diagnosis Date  . Asthma   . Back pain   . DDD (degenerative disc disease), lumbar   . DDD (degenerative disc disease), lumbar   . Difficult intubation   . GERD (gastroesophageal reflux disease)   . High cholesterol   . Kidney calculi   . Kidney stones     There are no active problems to display for this patient.   Past Surgical History:  Procedure Laterality Date  . CYSTOSCOPY W/ URETERAL STENT PLACEMENT  12/18/2010   Procedure: CYSTOSCOPY WITH RETROGRADE PYELOGRAM/URETERAL STENT PLACEMENT;  Surgeon: Lindaann Slough, MD;  Location: [facility];  Service: Urology;  Laterality: Left;  left ureteroscopy  . LITHOTRIPSY         Home Medications    Prior to Admission medications   Medication Sig Start Date End Date Taking? Authorizing Provider  cyclobenzaprine (FLEXERIL) 10 MG tablet Take 1 tablet (10 mg total) by mouth 3 (three) times daily as needed for muscle spasms. 05/19/14   Elwin Mocha, MD  esomeprazole (NEXIUM) 20 MG capsule Take 20 mg by mouth daily at 12 noon.    [provider]  ibuprofen (ADVIL,MOTRIN) 800 MG tablet Take 1 tablet (800 mg total) by mouth every 8 (eight) hours as needed for moderate pain. 02/05/14   Fayrene Helper, PA-C  ibuprofen (ADVIL,MOTRIN) 800 MG tablet Take 1 tablet (800 mg total) by mouth every 8 (eight) hours as needed for moderate pain. 05/19/14   Elwin Mocha, MD  omeprazole (PRILOSEC) 10 MG capsule Take 10 mg by mouth daily. Patient uses this medication prn.    [provider]    Family History History reviewed. No pertinent family history.  Social History Social History   Tobacco Use  . Smoking status:  Current Every Day Smoker    Packs/day: 1.00    Types: Cigarettes  . Smokeless tobacco: Never Used  Substance Use Topics  . Alcohol use: Yes    Comment: occasionally  . Drug use: No     Allergies   Patient has no known allergies.   Review of Systems Review of Systems  Gastrointestinal: Positive for abdominal pain, anorexia and diarrhea. Negative for nausea and vomiting.  Genitourinary: Negative for dysuria and frequency.  All other systems reviewed and are negative.    Physical Exam Updated Vital Signs BP (!) 147/83 (BP Location: Left Arm)   Pulse 77   Temp 98.3 F (36.8 C) (Oral)   Resp (!) 26   Ht 6' (1.829 m)   Wt 122.5 kg (270 lb)   SpO2 100%   BMI 36.62 kg/m   Physical Exam  Constitutional: He is oriented to person, place, and time. He appears well-developed and well-nourished. No distress.  HENT:  Head: Normocephalic and atraumatic.  Mouth/Throat: Oropharynx is clear and moist.  Eyes: Conjunctivae and EOM are normal. Pupils are equal, round, and reactive to light.  Neck: Normal range of motion. Neck supple.  Cardiovascular: Normal rate, regular rhythm and intact distal pulses.  No murmur heard. Pulmonary/Chest: Effort normal and breath sounds normal. No respiratory distress. He has no wheezes. He has no rales.  Abdominal: Soft. He exhibits no distension. There is tenderness in the right upper quadrant, epigastric area and left upper quadrant. There is no rebound, no guarding, no tenderness at McBurney's point and negative Murphy's sign.  Musculoskeletal: Normal range of motion. He exhibits no edema or tenderness.  Neurological: He is alert and oriented to person, place, and time.  Skin: Skin is warm and dry. No rash noted. No erythema.  Psychiatric: He has a normal mood and affect. His behavior is normal.  Nursing note and vitals reviewed.    ED Treatments / Results  Labs (all labs ordered are listed, but only abnormal results are displayed) Labs  Reviewed  BASIC METABOLIC PANEL - Abnormal; Notable for the following components:      Result Value   Glucose, Bld 162 (*)    BUN 21 (*)    All other components within normal limits  HEPATIC FUNCTION PANEL - Abnormal; Notable for the following components:   Indirect Bilirubin 1.0 (*)    All other components within normal limits  CBC  TROPONIN I  LIPASE, BLOOD    EKG  EKG Interpretation  Date/Time:  Friday April 14 2017 19:33:22 EDT Ventricular Rate:  76 PR Interval:  172 QRS Duration: 78 QT Interval:  364 QTC Calculation: 409 R Axis:   47 Text Interpretation:  Normal sinus rhythm Anteroseptal infarct , age undetermined No significant change since last  tracing Confirmed by Gwyneth Sproutlunkett, Anushri Casalino ([id number]) on 04/14/2017 8:01:56 PM       Radiology Dg Chest 2 View  Result Date: 04/14/2017 CLINICAL DATA:  Chest pain EXAM: CHEST - 2 VIEW COMPARISON:  02/12/2013 chest radiograph. FINDINGS: Stable cardiomediastinal silhouette with normal heart size. No pneumothorax. No pleural effusion. Lungs appear clear, with no acute consolidative airspace disease and no pulmonary edema. IMPRESSION: No active cardiopulmonary disease. Electronically Signed   By: Delbert PhenixJason A Poff M.D.   On: 04/14/2017 20:21   [place] Abdomen Limited Ruq  Result Date: 04/14/2017 CLINICAL DATA:  37 year old male with epigastric pain today radiating to the chest. EXAM: ULTRASOUND ABDOMEN LIMITED RIGHT UPPER QUADRANT COMPARISON:  Noncontrast CT Abdomen and Pelvis 10/12/2013. FINDINGS: Gallbladder: No gallstones or wall thickening visualized. No pericholecystic fluid. No sonographic Murphy sign noted by sonographer. Common bile duct: Diameter: Estimated at 2 millimeters (normal), suboptimally visible. Liver: Liver echogenicity is at the upper limits of normal to mildly increased (image 24). No discrete liver lesion. No intrahepatic biliary ductal dilatation is evident. Portal vein is patent on color Doppler imaging with normal direction of  blood flow towards the liver. Other findings: Negative visible right kidney. IMPRESSION: Normal gallbladder.  Negative study. Electronically Signed   By: Odessa FlemingH  Hall M.D.   On: 04/14/2017 21:04    Procedures Procedures (including critical care time)  Medications Ordered in ED Medications  ondansetron (ZOFRAN) injection 4 mg (not administered)  HYDROmorphone (DILAUDID) injection 1 mg (not administered)  sodium chloride 0.9 % bolus 1,000 mL (not administered)     Initial Impression / Assessment and Plan / ED Course  I have reviewed the triage vital signs and the nursing notes.  Pertinent labs & imaging results that were available during my care of the patient were reviewed by me and considered in my medical decision making (see chart for details).     Patient presenting today with sudden onset of upper abdominal pain radiating into the chest.  Patient appears uncomfortable on exam and has tenderness in the epigastric and right upper quadrants.  Concern for possible cholecystitis versus peptic ulcer disease.  Lower suspicion for perforation but could have esophageal spasm.  Also possible pancreatitis.  Low suspicion for any GU cause, diverticulitis or appendicitis.  CBC, CMP, lipase and right upper quadrant ultrasound pending.  Patient given pain and nausea control.  10:31 PM Patient symptoms significantly improved after IV pain medication and GI cocktail.  CBC, CMP, lipase within normal limits.  Right upper quadrant ultrasound without evidence of cholecystitis or gallstones.  Low suspicion the patient's symptoms are related to perforation but could be peptic ulcer disease and esophageal spasm.  Patient is currently taking omeprazole but recommended doubling his dose to 40 mg daily for the next 2 weeks.  Recommended he stay on a strict GERD diet.  Given follow-up with GI if symptoms do not improve.  Patient was tolerating p.o.'s without return of pain prior to discharge.  Final Clinical  Impressions(s) / ED Diagnoses   Final diagnoses:  RUQ pain  Epigastric pain    ED Discharge Orders    None       Gwyneth SproutPlunkett, Clea Dubach, MD 04/14/17 2234

## 2017-04-14 NOTE — Discharge Instructions (Signed)
Increase your omeprazole to 40 mg daily for the next 2 weeks.  Try to avoid spicy and acidic foods.  Do not take ibuprofen, aspirin or Alka-Seltzer.  Return to the ER immediately if you start having high fevers, uncontrollable vomiting or uncontrollable pain.

## 2017-04-14 NOTE — ED Triage Notes (Signed)
Patient states that he is having mid epigastric pain that radiates into his chest - Patient states that he is slightly SOB - Denies any N/V

## 2017-09-29 ENCOUNTER — Emergency Department (HOSPITAL_BASED_OUTPATIENT_CLINIC_OR_DEPARTMENT_OTHER)
Admission: EM | Admit: 2017-09-29 | Discharge: 2017-09-29 | Disposition: A | Discharge status: Home / Self Care | Payer: BLUE CROSS/BLUE SHIELD | Attending: Emergency Medicine | Admitting: Emergency Medicine

## 2017-09-29 ENCOUNTER — Other Ambulatory Visit: Payer: Self-pay

## 2017-09-29 ENCOUNTER — Encounter (HOSPITAL_BASED_OUTPATIENT_CLINIC_OR_DEPARTMENT_OTHER): Payer: Self-pay

## 2017-09-29 DIAGNOSIS — J45909 Unspecified asthma, uncomplicated: Secondary | ICD-10-CM | POA: Diagnosis not present

## 2017-09-29 DIAGNOSIS — F17210 Nicotine dependence, cigarettes, uncomplicated: Secondary | ICD-10-CM | POA: Insufficient documentation

## 2017-09-29 DIAGNOSIS — M79631 Pain in right forearm: Secondary | ICD-10-CM | POA: Insufficient documentation

## 2017-09-29 MED ORDER — PREDNISONE 50 MG PO TABS: 50.0000 mg | ORAL_TABLET | Freq: Every day | ORAL | 0 refills | Status: DC

## 2017-09-29 NOTE — ED Provider Notes (Signed)
[facility] EMERGENCY DEPARTMENT Provider Note   CSN: [id number] Arrival date & time: 09/29/17  1653     History   Chief Complaint Chief Complaint  Patient presents with  . Arm Pain    HPI Frank Robinson is a 37 y.o. male with history of tobacco abuse, asthma, degenerative disc disease, and hypercholesterolemia who presents to the emergency department today with complaints of RUE pain that has been intermittent for 2 months and constant over past 2 weeks. Patient states that he is having pain to the proximal dorsal aspect of the R forearm which at times radiates down the RUE into the hand. Pain is a 10/10 in severity, it is worse with certain movements, no alleviating factors. He was utilizing icy hot with some relief, but this has stopped working. He tried ibuprofen without change. He states pain is severe to point it hurts to grip things in the R hand. 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 Denies numbness, weakness, or paresthesias.  Denies fever, chills, redness, or open wounds.  Denies traumatic injury.  HPI  Past Medical History:  Diagnosis Date  . Asthma   . Back pain   . DDD (degenerative disc disease), lumbar   . DDD (degenerative disc disease), lumbar   . Difficult intubation   . GERD (gastroesophageal reflux disease)   . High cholesterol   . Kidney calculi   . Kidney stones     There are no active problems to display for this patient.   Past Surgical History:  Procedure Laterality Date  . CYSTOSCOPY W/ URETERAL STENT PLACEMENT  12/18/2010   Procedure: CYSTOSCOPY WITH RETROGRADE PYELOGRAM/URETERAL STENT PLACEMENT;  Surgeon: Lindaann Slough, MD;  Location: [facility];  Service: Urology;  Laterality: Left;  left ureteroscopy  . LITHOTRIPSY          Home Medications    Prior to Admission medications   Medication Sig Start Date End Date Taking? Authorizing Provider  cyclobenzaprine (FLEXERIL) 10 MG tablet Take 1  tablet (10 mg total) by mouth 3 (three) times daily as needed for muscle spasms. 05/19/14   Elwin Mocha, MD  esomeprazole (NEXIUM) 20 MG capsule Take 20 mg by mouth daily at 12 noon.    [provider]  ibuprofen (ADVIL,MOTRIN) 800 MG tablet Take 1 tablet (800 mg total) by mouth every 8 (eight) hours as needed for moderate pain. 02/05/14   Fayrene Helper, PA-C  ibuprofen (ADVIL,MOTRIN) 800 MG tablet Take 1 tablet (800 mg total) by mouth every 8 (eight) hours as needed for moderate pain. 05/19/14   Elwin Mocha, MD  omeprazole (PRILOSEC) 10 MG capsule Take 10 mg by mouth daily. Patient uses this medication prn.    [provider]    Family History No family history on file.  Social History Social History   Tobacco Use  . Smoking status: Current Every Day Smoker    Packs/day: 1.00    Types: Cigarettes  . Smokeless tobacco: Never Used  Substance Use Topics  . Alcohol use: Yes    Comment: occasionally  . Drug use: No     Allergies   Patient has no known allergies.   Review of Systems Review of Systems  Constitutional: Negative for chills and fever.  Musculoskeletal:       Positive for pain to right proximal forearm  Skin: Negative for color change, rash and wound.  Neurological: Negative for weakness and numbness.     Physical  Exam Updated Vital Signs BP (!) 149/91 (BP Location: Left Arm)   Pulse (!) 101   Temp 98.6 F (37 C) (Oral)   Resp 16   Ht 6' (1.829 m)   Wt 129.3 kg   SpO2 99%   BMI 38.65 kg/m   Physical Exam  Constitutional: He appears well-developed and well-nourished.  Non-toxic appearance. No distress.  HENT:  Head: Normocephalic and atraumatic.  Eyes: Conjunctivae are normal. Right eye exhibits no discharge. Left eye exhibits no discharge.  Neck: Normal range of motion. No spinous process tenderness and no muscular tenderness present.  Cardiovascular:  Pulses:      Radial pulses are 2+ on the right side, and 2+ on the left side.    Musculoskeletal:  Upper extremities: Patient has no obvious deformity, appreciable swelling, erythema, ecchymosis, or open wounds.  He has full intact range of motion to bilateral shoulders, elbows, wrist, and all digits.  Patient has discomfort with right elbow flexion, supination, and with wrist extension. Some discomfort with grip strength as well, but more so with above movements.  He is tender to palpation over the extensor muscles of the forearm as well as at the lateral epicondyle.  There are no other areas of tenderness to the upper extremity.  His compartments are soft.  Negative Tinel's of the median nerve, negative Phalen's sign, good strength with thumb abduction. Negative tinels of the median nerve.   Neurological: He is alert.  Clear speech.  Sensation grossly intact bilateral upper extremities.  Patient has 5 out of 5 symmetric grip strength.  Patient able to perform okay sign, thumbs up, and cross second and third digits bilaterally.  Psychiatric: He has a normal mood and affect. His behavior is normal. Thought content normal.  Nursing note and vitals reviewed.    ED Treatments / Results  Labs (all labs ordered are listed, but only abnormal results are displayed) Labs Reviewed - No data to display  EKG None  Radiology No results found.  Procedures Procedures (including critical care time)  Medications Ordered in ED Medications - No data to display   Initial Impression / Assessment and Plan / ED Course  I have reviewed the triage vital signs and the nursing notes.  Pertinent labs & imaging results that were available during my care of the patient were reviewed by me and considered in my medical decision making (see chart for details).   Patient presents to the emergency department with right forearm pain that radiates into the hand, worse with certain movements (elbow flexion/supination and wrist extension).  Patient tender to palpation over the lateral epicondyle and  extensor forearm muscles. NVI distally.  Given lack of traumatic injury doubt fracture or dislocation.  There is no overlying erythema or warmth or fevers to raise concern for infectious etiology.  Patient presentation suspicious for a lateral epicondylitis. Given lack of improvement with NSAIDs will trial steroid burst, recommended specific brace be purchased, and will have patient follow up with sports medicine. I discussed results, treatment plan, need for follow-up, and return precautions with the patient. Provided opportunity for questions, patient confirmed understanding and is in agreement with plan.    Final Clinical Impressions(s) / ED Diagnoses   Final diagnoses:  Pain of right forearm    ED Discharge Orders         Ordered    predniSONE (DELTASONE) 50 MG tablet  Daily with breakfast     09/29/17 1802  Cherly Andersonetrucelli, Undrea Shipes R, PA-C 09/29/17 1809    Terrilee FilesButler, Michael C, MD 09/30/17 1008

## 2017-09-29 NOTE — ED Notes (Signed)
Pt/family verbalized understanding of discharge instructions.   

## 2017-09-29 NOTE — Discharge Instructions (Signed)
You are seen in the emergency department today for forearm/elbow pain.  We suspect that this pain may be due to a condition known as a lateral epicondylitis, otherwise known as tennis elbow.  Please see the attached handout for further information regarding this diagnosis.  We are giving a prescription for steroids to help with this, please take this daily for the next 5 days.   We have prescribed you new medication(s) today. Discuss the medications prescribed today with your pharmacist as they can have adverse effects and interactions with your other medicines including over the counter and prescribed medications. Seek medical evaluation if you start to experience new or abnormal symptoms after taking one of these medicines, seek care immediately if you start to experience difficulty breathing, feeling of your throat closing, facial swelling, or rash as these could be indications of a more serious allergic reaction   Please asked the pharmacist about a lateral epicondylitis or tennis elbow brace at the drugstore, they will assist you in finding the correct brace to purchase.  Please apply ice 20 minutes on 40 minutes off to this area for the next 24 to 48 hours.  Please be sure to wrap the ice in a towel.  Rest as much as possible.  Please follow-up with the sports medicine doctor providing her discharge instructions within 1 week for reevaluation.  Return to the ER for new or worsening symptoms or any other concerns.

## 2017-09-29 NOTE — ED Triage Notes (Signed)
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

## 2018-07-19 ENCOUNTER — Encounter (HOSPITAL_BASED_OUTPATIENT_CLINIC_OR_DEPARTMENT_OTHER): Payer: Self-pay | Admitting: Emergency Medicine

## 2018-07-19 ENCOUNTER — Other Ambulatory Visit: Payer: Self-pay

## 2018-07-19 ENCOUNTER — Emergency Department (HOSPITAL_BASED_OUTPATIENT_CLINIC_OR_DEPARTMENT_OTHER)
Admission: EM | Admit: 2018-07-19 | Discharge: 2018-07-19 | Disposition: A | Discharge status: Home / Self Care | Payer: Self-pay | Attending: Emergency Medicine | Admitting: Emergency Medicine

## 2018-07-19 ENCOUNTER — Emergency Department (HOSPITAL_BASED_OUTPATIENT_CLINIC_OR_DEPARTMENT_OTHER): Payer: Self-pay

## 2018-07-19 DIAGNOSIS — Y999 Unspecified external cause status: Secondary | ICD-10-CM | POA: Insufficient documentation

## 2018-07-19 DIAGNOSIS — Y9389 Activity, other specified: Secondary | ICD-10-CM | POA: Insufficient documentation

## 2018-07-19 DIAGNOSIS — F17210 Nicotine dependence, cigarettes, uncomplicated: Secondary | ICD-10-CM | POA: Insufficient documentation

## 2018-07-19 DIAGNOSIS — S6991XA Unspecified injury of right wrist, hand and finger(s), initial encounter: Secondary | ICD-10-CM | POA: Insufficient documentation

## 2018-07-19 DIAGNOSIS — X509XXA Other and unspecified overexertion or strenuous movements or postures, initial encounter: Secondary | ICD-10-CM | POA: Insufficient documentation

## 2018-07-19 DIAGNOSIS — Y92003 Bedroom of unspecified non-institutional (private) residence as the place of occurrence of the external cause: Secondary | ICD-10-CM | POA: Insufficient documentation

## 2018-07-19 DIAGNOSIS — J45909 Unspecified asthma, uncomplicated: Secondary | ICD-10-CM | POA: Insufficient documentation

## 2018-07-19 MED ORDER — HYDROCODONE-ACETAMINOPHEN 5-325 MG PO TABS: 2.0000 | ORAL_TABLET | Freq: Four times a day (QID) | ORAL | 0 refills | Status: DC | PRN

## 2018-07-19 MED ORDER — HYDROCODONE-ACETAMINOPHEN 5-325 MG PO TABS
2.0000 | ORAL_TABLET | Freq: Once | ORAL | Status: AC
  Administered 2018-07-19: 2 via ORAL
  Filled 2018-07-19: qty 2

## 2018-07-19 MED ORDER — CEPHALEXIN 250 MG PO CAPS
500.0000 mg | ORAL_CAPSULE | Freq: Once | ORAL | Status: AC
  Administered 2018-07-19: 500 mg via ORAL
  Filled 2018-07-19: qty 2

## 2018-07-19 MED ORDER — SULFAMETHOXAZOLE-TRIMETHOPRIM 800-160 MG PO TABS
1.0000 | ORAL_TABLET | Freq: Once | ORAL | Status: AC
  Administered 2018-07-19: 1 via ORAL
  Filled 2018-07-19: qty 1

## 2018-07-19 MED ORDER — CEPHALEXIN 500 MG PO CAPS: 500.0000 mg | ORAL_CAPSULE | Freq: Four times a day (QID) | ORAL | 0 refills | Status: DC

## 2018-07-19 MED ORDER — IBUPROFEN 800 MG PO TABS: 800.0000 mg | ORAL_TABLET | Freq: Three times a day (TID) | ORAL | 0 refills | Status: DC | PRN

## 2018-07-19 MED ORDER — SULFAMETHOXAZOLE-TRIMETHOPRIM 800-160 MG PO TABS: 1.0000 | ORAL_TABLET | Freq: Two times a day (BID) | ORAL | 0 refills | Status: AC

## 2018-07-19 NOTE — ED Provider Notes (Signed)
TIME SEEN: 5:16 AM  CHIEF COMPLAINT: Right hand and wrist pain  HPI: Patient is a 38 year old right-hand-dominant male with history of asthma, hyperlipidemia, kidney stones who presents to the emergency department the right hand and wrist pain.  States about a week ago he got it caught when coming off of an escalator and had some pain and swelling to the hand and wrist that was improving.  States tonight he accidentally rolled out of bed and caught himself with the right hand and hyperextended his wrist.  He did not hit his head.  No neck or back pain.  States the swelling of his hand and wrist started increasing and he is now having tingling in his fingertips.  No medications taken prior to arrival.  ROS: See HPI Constitutional: no fever  Eyes: no drainage  ENT: no runny nose   Cardiovascular:  no chest pain  Resp: no SOB  GI: no vomiting GU: no dysuria Integumentary: no rash  Allergy: no hives  Musculoskeletal: no leg swelling  Neurological: no slurred speech ROS otherwise negative  PAST MEDICAL HISTORY/PAST SURGICAL HISTORY:  Past Medical History:  Diagnosis Date  . Asthma   . Back pain   . DDD (degenerative disc disease), lumbar   . DDD (degenerative disc disease), lumbar   . Difficult intubation   . GERD (gastroesophageal reflux disease)   . High cholesterol   . Kidney calculi   . Kidney stones     MEDICATIONS:  Prior to Admission medications   Medication Sig Start Date End Date Taking? Authorizing Provider  cyclobenzaprine (FLEXERIL) 10 MG tablet Take 1 tablet (10 mg total) by mouth 3 (three) times daily as needed for muscle spasms. 05/19/14   Elwin MochaWalden, Blair, MD  esomeprazole (NEXIUM) 20 MG capsule Take 20 mg by mouth daily at 12 noon.    [provider]  ibuprofen (ADVIL,MOTRIN) 800 MG tablet Take 1 tablet (800 mg total) by mouth every 8 (eight) hours as needed for moderate pain. 02/05/14   Fayrene Helperran, Bowie, PA-C  ibuprofen (ADVIL,MOTRIN) 800 MG tablet Take 1 tablet  (800 mg total) by mouth every 8 (eight) hours as needed for moderate pain. 05/19/14   Elwin MochaWalden, Blair, MD  omeprazole (PRILOSEC) 10 MG capsule Take 10 mg by mouth daily. Patient uses this medication prn.    [provider]  predniSONE (DELTASONE) 50 MG tablet Take 1 tablet (50 mg total) by mouth daily with breakfast. 09/29/17   Petrucelli, Samantha R, PA-C    ALLERGIES:  No Known Allergies  SOCIAL HISTORY:  Social History   Tobacco Use  . Smoking status: Current Every Day Smoker    Packs/day: 1.00    Types: Cigarettes  . Smokeless tobacco: Never Used  Substance Use Topics  . Alcohol use: Yes    Comment: occasionally    FAMILY HISTORY: History reviewed. No pertinent family history.  EXAM: BP (!) 149/79 (BP Location: Right Arm)   Pulse 100   Temp 98 F (36.7 C)   Resp 16   Ht 6' (1.829 m)   Wt 136.1 kg   SpO2 96%   BMI 40.69 kg/m  CONSTITUTIONAL: Alert and oriented and responds appropriately to questions. Well-appearing; well-nourished HEAD: Normocephalic, appears atraumatic EYES: Conjunctivae clear, pupils appear equal, EOMI ENT: normal nose; moist mucous membranes NECK: Supple, normal range of motion CARD: RRR RESP: Normal chest excursion without splinting or tachypnea; no hypoxia or respiratory distress, speaking full sentences ABD/GI: Nondistended BACK:  The back appears normal and is non-tender to  palpation EXT: Patient has significant swelling to the soft tissues of the right wrist and dorsal hand.  He is tender over the proximal right hand as well as his wrist diffusely.  No significant scaphoid tenderness.  There is no redness or warmth noted to the arm.  No fluctuance or induration.  2+ right radial pulse.  Normal capillary refill.  Reports tingling in his fingertips of the right hand.  Decreased grip strength secondary to pain and swelling.  Otherwise no major deformity of his extremities.  Compartments in the right arm are soft.  No tenderness over the right  elbow, forearm, humerus, shoulder. SKIN: Normal color for age and race; warm; no rash NEURO: Moves all extremities equally PSYCH: The patient's mood and manner are appropriate. Grooming and personal hygiene are appropriate.  MEDICAL DECISION MAKING: Patient here with right wrist and hand pain.  Will obtain x-rays.  Will provide pain medication.  His wife drove him to the emergency department.  No other injury on exam.  ED PROGRESS: Patient's x-ray shows no fracture.  He does have significant soft tissue swelling.  I have talked to him further and he now states that he has been having some chills but no documented fever and states that his hand and wrist were actually swollen a little bit before he ever injured it a week ago and it was red and warm.  This may be cellulitis contributing to his pain and swelling.  He has no fever here.  There is no sign of abscess on exam.  His compartments are soft.  Doubt compartment syndrome.  Doubt DVT.  He has a strong radial pulse without signs of arterial obstruction.  Normal capillary refill.  Doubt gout.  He denies history of IV drug abuse.  Will cover with Bactrim and Keflex in case there is a component of cellulitis causing his pain and swelling.  Have advised him to follow-up with his primary care doctor in 1 week.  Recommended rest, elevation and ice.  Will discharge with pain medication.  He verbalized understanding.  We discussed return precautions.   At this time, I do not feel there is any life-threatening condition present. I have reviewed and discussed all results (EKG, imaging, lab, urine as appropriate) and exam findings with patient/family. I have reviewed nursing notes and appropriate previous records.  I feel the patient is safe to be discharged home without further emergent workup and can continue workup as an outpatient as needed. Discussed usual and customary return precautions. Patient/family verbalize understanding and are comfortable with this  plan.  Outpatient follow-up has been provided as needed. All questions have been answered.      Ward, Delice Bison, DO 07/19/18 [phone]

## 2018-07-19 NOTE — ED Triage Notes (Signed)
Pt reports injuring R wrist in escalator last week, last night reports falling out of bed and landing on R wrist. Ace wrap in place. Swelling noted. Tylenol and goody powders prior to arrival.

## 2019-05-01 ENCOUNTER — Emergency Department (HOSPITAL_BASED_OUTPATIENT_CLINIC_OR_DEPARTMENT_OTHER): Payer: BC Managed Care – PPO

## 2019-05-01 ENCOUNTER — Encounter (HOSPITAL_BASED_OUTPATIENT_CLINIC_OR_DEPARTMENT_OTHER): Payer: Self-pay

## 2019-05-01 ENCOUNTER — Emergency Department (HOSPITAL_BASED_OUTPATIENT_CLINIC_OR_DEPARTMENT_OTHER)
Admission: EM | Admit: 2019-05-01 | Discharge: 2019-05-01 | Disposition: A | Discharge status: Home / Self Care | Payer: BC Managed Care – PPO | Attending: Emergency Medicine | Admitting: Emergency Medicine

## 2019-05-01 ENCOUNTER — Other Ambulatory Visit: Payer: Self-pay

## 2019-05-01 DIAGNOSIS — R519 Headache, unspecified: Secondary | ICD-10-CM | POA: Diagnosis not present

## 2019-05-01 DIAGNOSIS — Y999 Unspecified external cause status: Secondary | ICD-10-CM | POA: Diagnosis not present

## 2019-05-01 DIAGNOSIS — M7918 Myalgia, other site: Secondary | ICD-10-CM

## 2019-05-01 DIAGNOSIS — F17210 Nicotine dependence, cigarettes, uncomplicated: Secondary | ICD-10-CM | POA: Diagnosis not present

## 2019-05-01 DIAGNOSIS — Z79899 Other long term (current) drug therapy: Secondary | ICD-10-CM | POA: Insufficient documentation

## 2019-05-01 DIAGNOSIS — R55 Syncope and collapse: Secondary | ICD-10-CM | POA: Insufficient documentation

## 2019-05-01 DIAGNOSIS — Z91040 Latex allergy status: Secondary | ICD-10-CM | POA: Diagnosis not present

## 2019-05-01 DIAGNOSIS — S20212A Contusion of left front wall of thorax, initial encounter: Secondary | ICD-10-CM | POA: Insufficient documentation

## 2019-05-01 DIAGNOSIS — Y939 Activity, unspecified: Secondary | ICD-10-CM | POA: Insufficient documentation

## 2019-05-01 DIAGNOSIS — Y92410 Unspecified street and highway as the place of occurrence of the external cause: Secondary | ICD-10-CM | POA: Insufficient documentation

## 2019-05-01 DIAGNOSIS — S299XXA Unspecified injury of thorax, initial encounter: Secondary | ICD-10-CM | POA: Diagnosis present

## 2019-05-01 LAB — BASIC METABOLIC PANEL
Anion gap: 8 (ref 5–15)
BUN: 10 mg/dL (ref 6–20)
CO2: 27 mmol/L (ref 22–32)
Calcium: 9.1 mg/dL (ref 8.9–10.3)
Chloride: 102 mmol/L (ref 98–111)
Creatinine, Ser: 0.99 mg/dL (ref 0.61–1.24)
GFR calc Af Amer: >60 mL/min (ref >60)
GFR calc non Af Amer: >60 mL/min (ref >60)
Glucose, Bld: 103 mg/dL — ABNORMAL HIGH (ref 70–99)
Potassium: 4.3 mmol/L (ref 3.5–5.1)
Sodium: 137 mmol/L (ref 135–145)

## 2019-05-01 MED ORDER — CYCLOBENZAPRINE HCL 10 MG PO TABS
10.0000 mg | ORAL_TABLET | Freq: Once | ORAL | Status: AC
  Administered 2019-05-01: 10 mg via ORAL
  Filled 2019-05-01: qty 1

## 2019-05-01 MED ORDER — IOHEXOL 300 MG/ML  SOLN
100.0000 mL | Freq: Once | INTRAMUSCULAR | Status: AC | PRN
  Administered 2019-05-01: 80 mL via INTRAVENOUS

## 2019-05-01 MED ORDER — KETOROLAC TROMETHAMINE 15 MG/ML IJ SOLN
15.0000 mg | Freq: Once | INTRAMUSCULAR | Status: AC
  Administered 2019-05-01: 15 mg via INTRAVENOUS
  Filled 2019-05-01: qty 1
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
## 2019-09-29 ENCOUNTER — Emergency Department (HOSPITAL_BASED_OUTPATIENT_CLINIC_OR_DEPARTMENT_OTHER): Payer: Medicaid Other

## 2019-09-29 ENCOUNTER — Inpatient Hospital Stay (HOSPITAL_BASED_OUTPATIENT_CLINIC_OR_DEPARTMENT_OTHER)
Admission: EM | Admit: 2019-09-29 | Discharge: 2019-10-02 | DRG: 177 | Disposition: A | Discharge status: Home / Self Care | Payer: Medicaid Other | Attending: Internal Medicine | Admitting: Internal Medicine

## 2019-09-29 ENCOUNTER — Encounter (HOSPITAL_BASED_OUTPATIENT_CLINIC_OR_DEPARTMENT_OTHER): Payer: Self-pay | Admitting: *Deleted

## 2019-09-29 ENCOUNTER — Other Ambulatory Visit: Payer: Self-pay

## 2019-09-29 DIAGNOSIS — Z79899 Other long term (current) drug therapy: Secondary | ICD-10-CM

## 2019-09-29 DIAGNOSIS — K140 Glossitis: Secondary | ICD-10-CM

## 2019-09-29 DIAGNOSIS — J1282 Pneumonia due to coronavirus disease 2019: Secondary | ICD-10-CM | POA: Diagnosis present

## 2019-09-29 DIAGNOSIS — U071 COVID-19: Secondary | ICD-10-CM | POA: Diagnosis not present

## 2019-09-29 DIAGNOSIS — J9601 Acute respiratory failure with hypoxia: Secondary | ICD-10-CM | POA: Diagnosis not present

## 2019-09-29 DIAGNOSIS — E785 Hyperlipidemia, unspecified: Secondary | ICD-10-CM | POA: Diagnosis present

## 2019-09-29 DIAGNOSIS — M5136 Other intervertebral disc degeneration, lumbar region: Secondary | ICD-10-CM | POA: Diagnosis present

## 2019-09-29 DIAGNOSIS — J45909 Unspecified asthma, uncomplicated: Secondary | ICD-10-CM | POA: Diagnosis present

## 2019-09-29 DIAGNOSIS — F17210 Nicotine dependence, cigarettes, uncomplicated: Secondary | ICD-10-CM | POA: Diagnosis present

## 2019-09-29 DIAGNOSIS — E7800 Pure hypercholesterolemia, unspecified: Secondary | ICD-10-CM | POA: Diagnosis present

## 2019-09-29 DIAGNOSIS — K219 Gastro-esophageal reflux disease without esophagitis: Secondary | ICD-10-CM | POA: Diagnosis present

## 2019-09-29 DIAGNOSIS — Z6829 Body mass index (BMI) 29.0-29.9, adult: Secondary | ICD-10-CM

## 2019-09-29 DIAGNOSIS — J4520 Mild intermittent asthma, uncomplicated: Secondary | ICD-10-CM | POA: Diagnosis present

## 2019-09-29 DIAGNOSIS — M51369 Other intervertebral disc degeneration, lumbar region without mention of lumbar back pain or lower extremity pain: Secondary | ICD-10-CM | POA: Diagnosis present

## 2019-09-29 DIAGNOSIS — K029 Dental caries, unspecified: Secondary | ICD-10-CM

## 2019-09-29 LAB — GROUP A STREP BY PCR: Group A Strep by PCR: NOT DETECTED

## 2019-09-29 LAB — CBC WITH DIFFERENTIAL/PLATELET
Abs Immature Granulocytes: 0.05 10*3/uL (ref 0.00–0.07)
Basophils Absolute: 0 10*3/uL (ref 0.0–0.1)
Basophils Relative: 1 %
Eosinophils Absolute: 0 10*3/uL (ref 0.0–0.5)
Eosinophils Relative: 0 %
HCT: 46.9 % (ref 39.0–52.0)
Hemoglobin: 14.4 g/dL (ref 13.0–17.0)
Immature Granulocytes: 1 %
Lymphocytes Relative: 16 %
Lymphs Abs: 1 10*3/uL (ref 0.7–4.0)
MCH: 28.3 pg (ref 26.0–34.0)
MCHC: 30.7 g/dL (ref 30.0–36.0)
MCV: 92.3 fL (ref 80.0–100.0)
Monocytes Absolute: 0.3 10*3/uL (ref 0.1–1.0)
Monocytes Relative: 4 %
Neutro Abs: 5 10*3/uL (ref 1.7–7.7)
Neutrophils Relative %: 78 %
Platelets: 170 10*3/uL (ref 150–400)
RBC: 5.08 MIL/uL (ref 4.22–5.81)
RDW: 15.9 % — ABNORMAL HIGH (ref 11.5–15.5)
WBC: 6.3 10*3/uL (ref 4.0–10.5)
nRBC: 1.9 % — ABNORMAL HIGH (ref 0.0–0.2)

## 2019-09-29 LAB — PROCALCITONIN: Procalcitonin: 13.49 ng/mL

## 2019-09-29 LAB — LACTIC ACID, PLASMA: Lactic Acid, Venous: 1.2 mmol/L (ref 0.5–1.9)

## 2019-09-29 LAB — COMPREHENSIVE METABOLIC PANEL
ALT: 31 U/L (ref 0–44)
AST: 25 U/L (ref 15–41)
Albumin: 3.4 g/dL — ABNORMAL LOW (ref 3.5–5.0)
Alkaline Phosphatase: 49 U/L (ref 38–126)
Anion gap: 10 (ref 5–15)
BUN: 14 mg/dL (ref 6–20)
CO2: 29 mmol/L (ref 22–32)
Calcium: 8.3 mg/dL — ABNORMAL LOW (ref 8.9–10.3)
Chloride: 95 mmol/L — ABNORMAL LOW (ref 98–111)
Creatinine, Ser: 0.83 mg/dL (ref 0.61–1.24)
GFR calc Af Amer: >60 mL/min (ref >60)
GFR calc non Af Amer: >60 mL/min (ref >60)
Glucose, Bld: 139 mg/dL — ABNORMAL HIGH (ref 70–99)
Potassium: 4 mmol/L (ref 3.5–5.1)
Sodium: 134 mmol/L — ABNORMAL LOW (ref 135–145)
Total Bilirubin: 0.9 mg/dL (ref 0.3–1.2)
Total Protein: 7 g/dL (ref 6.5–8.1)

## 2019-09-29 LAB — LACTATE DEHYDROGENASE: LDH: 221 U/L — ABNORMAL HIGH (ref 98–192)

## 2019-09-29 LAB — C-REACTIVE PROTEIN: CRP: 13.3 mg/dL — ABNORMAL HIGH (ref <1.0)

## 2019-09-29 LAB — FIBRINOGEN: Fibrinogen: 482 mg/dL — ABNORMAL HIGH (ref 210–475)

## 2019-09-29 LAB — D-DIMER, QUANTITATIVE: D-Dimer, Quant: 0.86 ug/mL-FEU — ABNORMAL HIGH (ref 0.00–0.50)

## 2019-09-29 LAB — SARS CORONAVIRUS 2 BY RT PCR (HOSPITAL ORDER, PERFORMED IN ~~LOC~~ HOSPITAL LAB): SARS Coronavirus 2: POSITIVE — AB

## 2019-09-29 LAB — TRIGLYCERIDES: Triglycerides: 114 mg/dL (ref <150)

## 2019-09-29 LAB — FERRITIN: Ferritin: 99 ng/mL (ref 24–336)

## 2019-09-29 MED ORDER — ACETAMINOPHEN 500 MG PO TABS
1000.0000 mg | ORAL_TABLET | Freq: Once | ORAL | Status: AC
  Administered 2019-09-29: 1000 mg via ORAL
  Filled 2019-09-29: qty 2

## 2019-09-29 MED ORDER — SODIUM CHLORIDE 0.9 % IV SOLN: 500.0000 mg | Freq: Once | INTRAVENOUS | Status: DC

## 2019-09-29 MED ORDER — SODIUM CHLORIDE 0.9 % IV SOLN
100.0000 mg | Freq: Once | INTRAVENOUS | Status: AC
  Administered 2019-09-29: 100 mg via INTRAVENOUS
  Filled 2019-09-29: qty 20

## 2019-09-29 MED ORDER — SODIUM CHLORIDE 0.9 % IV SOLN
INTRAVENOUS | Status: DC | PRN
  Administered 2019-09-29 – 2019-09-30 (×2): 250 mL via INTRAVENOUS

## 2019-09-29 MED ORDER — DEXAMETHASONE SODIUM PHOSPHATE 10 MG/ML IJ SOLN
10.0000 mg | Freq: Once | INTRAMUSCULAR | Status: AC
  Administered 2019-09-29: 10 mg via INTRAVENOUS
  Filled 2019-09-29: qty 1

## 2019-09-29 MED ORDER — SODIUM CHLORIDE 0.9 % IV SOLN
1.0000 g | Freq: Once | INTRAVENOUS | Status: AC
  Administered 2019-09-29: 1 g via INTRAVENOUS
  Filled 2019-09-29: qty 10

## 2019-09-29 MED ORDER — SODIUM CHLORIDE 0.9 % IV SOLN
100.0000 mg | Freq: Every day | INTRAVENOUS | Status: DC
  Administered 2019-09-30 – 2019-10-02 (×3): 100 mg via INTRAVENOUS
  Filled 2019-09-29 (×3): qty 20

## 2019-09-29 MED ORDER — AZITHROMYCIN 250 MG PO TABS
500.0000 mg | ORAL_TABLET | Freq: Once | ORAL | Status: AC
  Administered 2019-09-29: 500 mg via ORAL
  Filled 2019-09-29: qty 2

## 2019-09-29 MED ORDER — SODIUM CHLORIDE 0.9 % IV SOLN
1000.0000 mL | INTRAVENOUS | Status: DC
  Administered 2019-09-29: 1000 mL via INTRAVENOUS

## 2019-09-29 NOTE — ED Notes (Signed)
Portable Xray at bedside.

## 2019-09-29 NOTE — ED Notes (Signed)
PTs FIO2 reduced from 6LT to 2 LT HR 81, SPO2 97% RR 10,

## 2019-09-29 NOTE — ED Triage Notes (Signed)
Pt reports that a tooth broke off, states that it cut his tongue and that now his tongue is infected and that he also feels like he has infection in his throat. Pt denies respiratory complaints but is noted to be 78-79% RA with good waveform.

## 2019-09-29 NOTE — ED Provider Notes (Signed)
[facility] EMERGENCY DEPARTMENT Provider Note   CSN: [id number] Arrival date & time: 09/29/19  1829     History Chief Complaint  Patient presents with  . Dental Problem  . Fever    Frank Robinson is a 39 y.o. male.  Pt presents to the ED today with dental pain and sore throat.  Pt said his tooth broke and it cut his tongue.  He has some white spots on his tongue now which he thinks may be infection.  Pt also has a sore throat.  Pt denies sob, but said his wife told him that he was not breathing right.  When pt arrived, his RA O2 sat was 78%.  Getting up from the wheelchair and getting into bed dropped his O2 sat to 66%.  Pt now on 6L and O2 sat is in the low 90s.  Pt has not been vaccinated against Covid.         Past Medical History:  Diagnosis Date  . Asthma   . Back pain   . DDD (degenerative disc disease), lumbar   . DDD (degenerative disc disease), lumbar   . Difficult intubation   . GERD (gastroesophageal reflux disease)   . High cholesterol   . Kidney calculi   . Kidney stones     Patient Active Problem List   Diagnosis Date Noted  . Acute hypoxemic respiratory failure due to COVID-19 [facility]) 09/29/2019    Past Surgical History:  Procedure Laterality Date  . CYSTOSCOPY W/ URETERAL STENT PLACEMENT  12/18/2010   Procedure: CYSTOSCOPY WITH RETROGRADE PYELOGRAM/URETERAL STENT PLACEMENT;  Surgeon: Lindaann Slough, MD;  Location: [facility];  Service: Urology;  Laterality: Left;  left ureteroscopy  . LITHOTRIPSY         History reviewed. No pertinent family history.  Social History   Tobacco Use  . Smoking status: Current Every Day Smoker    Packs/day: 1.00    Types: Cigarettes  . Smokeless tobacco: Never Used  Vaping Use  . Vaping Use: Some days  Substance Use Topics  . Alcohol use: Yes    Comment: occasionally  . Drug use: Yes    Home Medications Prior to Admission medications   Medication Sig Start Date End Date Taking? Authorizing  Provider  atorvastatin (LIPITOR) 20 MG tablet Take 20 mg by mouth daily. 04/23/19   [provider]  cephALEXin (KEFLEX) 500 MG capsule Take 1 capsule (500 mg total) by mouth 4 (four) times daily. 07/19/18   Ward, Layla Maw, DO  cyclobenzaprine (FLEXERIL) 5 MG tablet Take 5 mg by mouth 3 (three) times daily as needed for muscle spasms.    [provider]  esomeprazole (NEXIUM) 20 MG capsule Take 20 mg by mouth daily at 12 noon.    [provider]  HYDROcodone-acetaminophen (NORCO/VICODIN) 5-325 MG tablet Take 2 tablets by mouth every 6 (six) hours as needed. 07/19/18   Ward, Layla Maw, DO  ibuprofen (ADVIL) 800 MG tablet Take 1 tablet (800 mg total) by mouth every 8 (eight) hours as needed for mild pain. 07/19/18   Ward, Layla Maw, DO  omeprazole (PRILOSEC) 10 MG capsule Take 10 mg by mouth daily. Patient uses this medication prn.    [provider]  traMADol (ULTRAM) 50 MG tablet Take 50 mg by mouth daily as needed. 04/25/19   [provider]    Allergies    Latex  Review of Systems   Review of Systems  HENT: Positive for dental problem and sore  throat.        Left sided tongue pain  All other systems reviewed and are negative.   Physical Exam Updated Vital Signs BP (!) 125/93   Pulse 89   Temp (!) 100.4 F (38 C) (Oral)   Resp 13   Ht 6' (1.829 m)   Wt 99.8 kg   SpO2 100%   BMI 29.84 kg/m   Physical Exam Vitals and nursing note reviewed.  Constitutional:      General: He is in acute distress.     Appearance: He is obese.  HENT:     Head: Normocephalic and atraumatic.     Comments: Ulcers to left tongue    Right Ear: External ear normal.     Left Ear: External ear normal.     Mouth/Throat:     Pharynx: Oropharyngeal exudate present.     Comments: Exudate to left tonsil Eyes:     Extraocular Movements: Extraocular movements intact.     Conjunctiva/sclera: Conjunctivae normal.     Pupils: Pupils are equal, round, and reactive to  light.  Cardiovascular:     Rate and Rhythm: Regular rhythm. Tachycardia present.     Pulses: Normal pulses.     Heart sounds: Normal heart sounds.  Pulmonary:     Effort: Tachypnea present.     Breath sounds: Normal breath sounds.  Abdominal:     General: Abdomen is flat. Bowel sounds are normal.     Palpations: Abdomen is soft.  Musculoskeletal:        General: Normal range of motion.     Cervical back: Normal range of motion and neck supple.  Skin:    General: Skin is warm.     Capillary Refill: Capillary refill takes less than 2 seconds.  Neurological:     General: No focal deficit present.     Mental Status: He is alert and oriented to person, place, and time.  Psychiatric:        Mood and Affect: Mood normal.        Behavior: Behavior normal.        Thought Content: Thought content normal.        Judgment: Judgment normal.     ED Results / Procedures / Treatments   Labs (all labs ordered are listed, but only abnormal results are displayed) Labs Reviewed  SARS CORONAVIRUS 2 BY RT PCR (HOSPITAL ORDER, PERFORMED IN [facility]) - Abnormal; Notable for the following components:      Result Value   SARS Coronavirus 2 POSITIVE (*)    All other components within normal limits  CBC WITH DIFFERENTIAL/PLATELET - Abnormal; Notable for the following components:   RDW 15.9 (*)    nRBC 1.9 (*)    All other components within normal limits  COMPREHENSIVE METABOLIC PANEL - Abnormal; Notable for the following components:   Sodium 134 (*)    Chloride 95 (*)    Glucose, Bld 139 (*)    Calcium 8.3 (*)    Albumin 3.4 (*)    All other components within normal limits  D-DIMER, QUANTITATIVE (NOT AT [facility]) - Abnormal; Notable for the following components:   D-Dimer, Quant 0.86 (*)    All other components within normal limits  GROUP A STREP BY PCR  CULTURE, BLOOD (ROUTINE X 2)  CULTURE, BLOOD (ROUTINE X 2)  LACTIC ACID, PLASMA  LACTIC ACID, PLASMA  PROCALCITONIN    LACTATE DEHYDROGENASE  FERRITIN  TRIGLYCERIDES  FIBRINOGEN  C-REACTIVE PROTEIN  EKG EKG Interpretation  Date/Time:  Sunday September 29 2019 18:51:03 EDT Ventricular Rate:  107 PR Interval:    QRS Duration: 78 QT Interval:  322 QTC Calculation: 430 R Axis:   58 Text Interpretation: Sinus tachycardia Atrial premature complexes Anteroseptal infarct, old Since last tracing rate faster Confirmed by Jacalyn LefevreHaviland, Daman Steffenhagen [phone]([id number]) on 09/29/2019 7:27:55 PM   Radiology DG Chest Port 1 View  Result Date: 09/29/2019 CLINICAL DATA:  Shortness of breath EXAM: PORTABLE CHEST 1 VIEW COMPARISON:  Chest CT 05/01/2019 FINDINGS: Multifocal patchy ill-defined opacities within both lungs. Upper normal heart size. Normal mediastinal contours. Mild peribronchial thickening. No pneumothorax or large pleural effusion. No acute osseous abnormalities are seen. IMPRESSION: 1. Multifocal patchy ill-defined opacities within both lungs, suspicious for multifocal pneumonia, including COVID-19. 2. Peribronchial thickening may be related smoking history or less likely pulmonary edema. Electronically Signed   By: Narda RutherfordMelanie  Sanford M.D.   On: 09/29/2019 19:30    Procedures Procedures (including critical care time)  Medications Ordered in ED Medications  0.9 %  sodium chloride infusion (1,000 mLs Intravenous New Bag/Given 09/29/19 1927)  azithromycin (ZITHROMAX) 500 mg in sodium chloride 0.9 % 250 mL IVPB (has no administration in time range)  0.9 %  sodium chloride infusion (250 mLs Intravenous New Bag/Given 09/29/19 1950)  acetaminophen (TYLENOL) tablet 1,000 mg (1,000 mg Oral Given 09/29/19 1925)  dexamethasone (DECADRON) injection 10 mg (10 mg Intravenous Given 09/29/19 1945)  cefTRIAXone (ROCEPHIN) 1 g in sodium chloride 0.9 % 100 mL IVPB (1 g Intravenous New Bag/Given 09/29/19 1951)    ED Course  I have reviewed the triage vital signs and the nursing notes.  Pertinent labs & imaging results that were available during  my care of the patient were reviewed by me and considered in my medical decision making (see chart for details).    MDM Rules/Calculators/A&P                           Pt is doing well on 6L.  He is + for Covid.  He was given rocephin/zithromax/decadron/remdesivir.   He was d/w Dr. Antionette Charpyd ([facility]) for admission.  CRITICAL CARE Performed by: Jacalyn LefevreJulie Rohaan Durnil   Total critical care time: 30 minutes  Critical care time was exclusive of separately billable procedures and treating other patients.  Critical care was necessary to treat or prevent imminent or life-threatening deterioration.  Critical care was time spent personally by me on the following activities: development of treatment plan with patient and/or surrogate as well as nursing, discussions with consultants, evaluation of patient's response to treatment, examination of patient, obtaining history from patient or surrogate, ordering and performing treatments and interventions, ordering and review of laboratory studies, ordering and review of radiographic studies, pulse oximetry and re-evaluation of patient's condition.  Frank Robinson was evaluated in Emergency Department on 09/29/2019 for the symptoms described in the history of present illness. He was evaluated in the context of the global COVID-19 pandemic, which necessitated consideration that the patient might be at risk for infection with the SARS-CoV-2 virus that causes COVID-19. Institutional protocols and algorithms that pertain to the evaluation of patients at risk for COVID-19 are in a state of rapid change based on information released by regulatory bodies including the CDC and federal and state organizations. These policies and algorithms were followed during the patient's care in the ED.  Final Clinical Impression(s) / ED Diagnoses Final diagnoses:  Pneumonia due to COVID-19 virus  Acute respiratory  failure with hypoxia (HCC)  Dental caries  Tongue ulcer    Rx / DC  Orders ED Discharge Orders    None       Jacalyn Lefevre, MD 09/29/19 2116

## 2019-09-30 DIAGNOSIS — E7800 Pure hypercholesterolemia, unspecified: Secondary | ICD-10-CM | POA: Diagnosis present

## 2019-09-30 DIAGNOSIS — J1282 Pneumonia due to coronavirus disease 2019: Secondary | ICD-10-CM | POA: Diagnosis present

## 2019-09-30 DIAGNOSIS — F17210 Nicotine dependence, cigarettes, uncomplicated: Secondary | ICD-10-CM | POA: Diagnosis present

## 2019-09-30 DIAGNOSIS — K219 Gastro-esophageal reflux disease without esophagitis: Secondary | ICD-10-CM | POA: Diagnosis present

## 2019-09-30 DIAGNOSIS — Z79899 Other long term (current) drug therapy: Secondary | ICD-10-CM | POA: Diagnosis not present

## 2019-09-30 DIAGNOSIS — J4520 Mild intermittent asthma, uncomplicated: Secondary | ICD-10-CM | POA: Diagnosis present

## 2019-09-30 DIAGNOSIS — M5136 Other intervertebral disc degeneration, lumbar region: Secondary | ICD-10-CM | POA: Diagnosis present

## 2019-09-30 DIAGNOSIS — Z6829 Body mass index (BMI) 29.0-29.9, adult: Secondary | ICD-10-CM | POA: Diagnosis not present

## 2019-09-30 DIAGNOSIS — J45909 Unspecified asthma, uncomplicated: Secondary | ICD-10-CM | POA: Diagnosis present

## 2019-09-30 DIAGNOSIS — U071 COVID-19: Secondary | ICD-10-CM | POA: Diagnosis present

## 2019-09-30 DIAGNOSIS — E785 Hyperlipidemia, unspecified: Secondary | ICD-10-CM | POA: Diagnosis present

## 2019-09-30 DIAGNOSIS — J9601 Acute respiratory failure with hypoxia: Secondary | ICD-10-CM | POA: Diagnosis present

## 2019-09-30 DIAGNOSIS — K029 Dental caries, unspecified: Secondary | ICD-10-CM | POA: Diagnosis present

## 2019-09-30 MED ORDER — ONDANSETRON HCL 4 MG/2ML IJ SOLN: 4.0000 mg | Freq: Four times a day (QID) | INTRAMUSCULAR | Status: DC | PRN

## 2019-09-30 MED ORDER — GUAIFENESIN-DM 100-10 MG/5ML PO SYRP
10.0000 mL | ORAL_SOLUTION | ORAL | Status: DC | PRN
  Administered 2019-10-01 (×2): 10 mL via ORAL
  Filled 2019-09-30 (×2): qty 10

## 2019-09-30 MED ORDER — SODIUM CHLORIDE 0.9 % IV SOLN: 100.0000 mg | Freq: Every day | INTRAVENOUS | Status: DC

## 2019-09-30 MED ORDER — HYDROCOD POLST-CPM POLST ER 10-8 MG/5ML PO SUER
5.0000 mL | Freq: Two times a day (BID) | ORAL | Status: DC | PRN
  Administered 2019-09-30 – 2019-10-01 (×2): 5 mL via ORAL
  Filled 2019-09-30 (×2): qty 5

## 2019-09-30 MED ORDER — SODIUM CHLORIDE 0.9 % IV SOLN
500.0000 mg | INTRAVENOUS | Status: DC
  Administered 2019-09-30 – 2019-10-01 (×2): 500 mg via INTRAVENOUS
  Filled 2019-09-30 (×3): qty 500

## 2019-09-30 MED ORDER — LIDOCAINE VISCOUS HCL 2 % MT SOLN
15.0000 mL | Freq: Four times a day (QID) | OROMUCOSAL | Status: DC | PRN
  Administered 2019-09-30 – 2019-10-01 (×5): 15 mL via OROMUCOSAL
  Filled 2019-09-30 (×5): qty 15

## 2019-09-30 MED ORDER — ACETAMINOPHEN 325 MG PO TABS: 650.0000 mg | ORAL_TABLET | Freq: Four times a day (QID) | ORAL | Status: DC | PRN

## 2019-09-30 MED ORDER — FOLIC ACID 5 MG/ML IJ SOLN
1.0000 mg | Freq: Every day | INTRAMUSCULAR | Status: DC
  Administered 2019-09-30 – 2019-10-01 (×2): 1 mg via INTRAVENOUS
  Filled 2019-09-30 (×2): qty 0.2

## 2019-09-30 MED ORDER — SODIUM CHLORIDE 0.45 % IV SOLN: INTRAVENOUS | Status: DC

## 2019-09-30 MED ORDER — SODIUM CHLORIDE 0.9 % IV SOLN
1.0000 g | INTRAVENOUS | Status: DC
  Administered 2019-09-30 – 2019-10-01 (×2): 1 g via INTRAVENOUS
  Filled 2019-09-30 (×2): qty 10
  Filled 2019-09-30: qty 1

## 2019-09-30 MED ORDER — ONDANSETRON HCL 4 MG PO TABS: 4.0000 mg | ORAL_TABLET | Freq: Four times a day (QID) | ORAL | Status: DC | PRN

## 2019-09-30 MED ORDER — IPRATROPIUM-ALBUTEROL 20-100 MCG/ACT IN AERS
1.0000 | INHALATION_SPRAY | Freq: Four times a day (QID) | RESPIRATORY_TRACT | Status: DC
  Administered 2019-09-30 – 2019-10-02 (×8): 1 via RESPIRATORY_TRACT
  Filled 2019-09-30: qty 4

## 2019-09-30 MED ORDER — ENOXAPARIN SODIUM 40 MG/0.4ML ~~LOC~~ SOLN
40.0000 mg | SUBCUTANEOUS | Status: DC
  Administered 2019-09-30 – 2019-10-01 (×2): 40 mg via SUBCUTANEOUS
  Filled 2019-09-30 (×2): qty 0.4

## 2019-09-30 MED ORDER — DEXAMETHASONE SODIUM PHOSPHATE 10 MG/ML IJ SOLN
6.0000 mg | INTRAMUSCULAR | Status: DC
  Administered 2019-09-30 – 2019-10-01 (×2): 6 mg via INTRAVENOUS
  Filled 2019-09-30 (×2): qty 1

## 2019-09-30 MED ORDER — SODIUM CHLORIDE 0.9 % IV SOLN: 200.0000 mg | Freq: Once | INTRAVENOUS | Status: DC

## 2019-09-30 NOTE — ED Notes (Signed)
Attempted to call report to [phone]; this RN contact info provided for callback.

## 2019-09-30 NOTE — ED Notes (Signed)
PT PLACED ON 15 lt hfnc. sats 98_100

## 2019-09-30 NOTE — H&P (Signed)
History and Physical   Frank Robinson MWN:[id number] DOB: 28-Feb-1980 DOA: 09/29/2019  Referring MD/NP/PA: Dr. Pilar Plate  PCP: Maye Hides, PA   Outpatient Specialists: None   Patient coming from: [facility]  Chief Complaint: Shortness of Breath  HPI: Frank Robinson is a 39 y.o. male with medical history significant of Asthma, DDD, GERD and Hyperlipidemia who [facility] with shortness of breath cough and wheezing.  Symptoms have been going on for couple of days. Also fever with some chills. Dental Pain As Well As Sore Throat.  He Also Had Loss of Tests.  On Arrival in the ER His Oxygen Sats on Room Air Was 78%.  Oxygen saturation dropped to 66% with limited mobility.  He was placed on oxygen initially up to 6 L but currently off to 15 L.  He is requiring oxygen by nonrebreather bag when he sleeps.  Patient is currently screen was positive with bilateral infiltrates suggestive of Covid pneumonia.  He was not vaccinated against Covid 19. He was transferred to the hospital for evaluation and treatment.  ED Course: Temperature is 100.4, blood pressure was 62/127, pulse 113, respiratory 26, oxygen sats 92% on 6 L. Sodium 134 potassium 4.0 chloride 95 CO2 29 glucose 139.  Unfortunately creatinine 0.83.  LFT's appeared to be within normal. LDH 221 triglyceride 114 ferritin 99.  CRP 13.3 lactic acid 1.2 protested and 13.49. D-dimer 0.86 fibrinogen 482.  Covid 19 is positive.  CXR that showed bilateral infiltrates.  Review of Systems: As per HPI otherwise 10 point review of systems negative.    Past Medical History:  Diagnosis Date  . Asthma   . Back pain   . DDD (degenerative disc disease), lumbar   . DDD (degenerative disc disease), lumbar   . Difficult intubation   . GERD (gastroesophageal reflux disease)   . High cholesterol   . Kidney calculi   . Kidney stones     Past Surgical History:  Procedure Laterality Date  . CYSTOSCOPY W/ URETERAL STENT  PLACEMENT  12/18/2010   Procedure: CYSTOSCOPY WITH RETROGRADE PYELOGRAM/URETERAL STENT PLACEMENT;  Surgeon: Lindaann Slough, MD;  Location: [facility];  Service: Urology;  Laterality: Left;  left ureteroscopy  . LITHOTRIPSY       reports that he has been smoking cigarettes. He has been smoking about 1.00 pack per day. He has never used smokeless tobacco. He reports current alcohol use. He reports current drug use.  Allergies  Allergen Reactions  . Latex Rash    History reviewed. No pertinent family history.   Prior to Admission medications   Medication Sig Start Date End Date Taking? Authorizing Provider  atorvastatin (LIPITOR) 20 MG tablet Take 20 mg by mouth daily. 04/23/19   [provider]  cephALEXin (KEFLEX) 500 MG capsule Take 1 capsule (500 mg total) by mouth 4 (four) times daily. 07/19/18   Ward, Layla Maw, DO  cyclobenzaprine (FLEXERIL) 5 MG tablet Take 5 mg by mouth 3 (three) times daily as needed for muscle spasms.    [provider]  esomeprazole (NEXIUM) 20 MG capsule Take 20 mg by mouth daily at 12 noon.    [provider]  HYDROcodone-acetaminophen (NORCO/VICODIN) 5-325 MG tablet Take 2 tablets by mouth every 6 (six) hours as needed. 07/19/18   Ward, Layla Maw, DO  ibuprofen (ADVIL) 800 MG tablet Take 1 tablet (800 mg total) by mouth every 8 (eight) hours as needed for mild pain. 07/19/18   Ward, Layla Maw, DO  omeprazole (  PRILOSEC) 10 MG capsule Take 10 mg by mouth daily. Patient uses this medication prn.    [provider]  traMADol (ULTRAM) 50 MG tablet Take 50 mg by mouth daily as needed. 04/25/19   [provider]    Physical Exam: Vitals:   09/30/19 1500 09/30/19 1600 09/30/19 1630 09/30/19 1813  BP: 113/69 130/84 114/72 129/78  Pulse: 89 78 83 79  Resp: (!) 24   20  Temp:    99.3 F (37.4 C)  TempSrc:    Oral  SpO2: 99% 96% 97% 99%  Weight:    99.8 kg  Height:    6' (1.829 m)      Constitutional: NAD, calm,  comfortable Vitals:   09/30/19 1500 09/30/19 1600 09/30/19 1630 09/30/19 1813  BP: 113/69 130/84 114/72 129/78  Pulse: 89 78 83 79  Resp: (!) 24   20  Temp:    99.3 F (37.4 C)  TempSrc:    Oral  SpO2: 99% 96% 97% 99%  Weight:    99.8 kg  Height:    6' (1.829 m)   Eyes: PERRL, lids and conjunctivae normal ENMT: Mucous membranes are moist. Posterior pharynx clear of any exudate or lesions.Normal dentition.  Neck: normal, supple, no masses, no thyromegaly Respiratory: clear to auscultation bilaterally, no wheezing, no crackles. Normal respiratory effort. No accessory muscle use.  Cardiovascular: Regular rate and rhythm, no murmurs / rubs / gallops. No extremity edema. 2+ pedal pulses. No carotid bruits.  Abdomen: no tenderness, no masses palpated. No hepatosplenomegaly. Bowel sounds positive.  Musculoskeletal: no clubbing / cyanosis. No joint deformity upper and lower extremities. Good ROM, no contractures. Normal muscle tone.  Skin: no rashes, lesions, ulcers. No induration Neurologic: CN 2-12 grossly intact. Sensation intact, DTR normal. Strength 5/5 in all 4.  Psychiatric: Normal judgment and insight. Alert and oriented x 3. Normal mood.     Labs on Admission: I have personally reviewed following labs and imaging studies  CBC: Recent Labs  Lab 09/29/19 1858  WBC 6.3  NEUTROABS 5.0  HGB 14.4  HCT 46.9  MCV 92.3  PLT 170   Basic Metabolic Panel: Recent Labs  Lab 09/29/19 1858  NA 134*  K 4.0  CL 95*  CO2 29  GLUCOSE 139*  BUN 14  CREATININE 0.83  CALCIUM 8.3*   GFR: Estimated Creatinine Clearance: 147.6 mL/min (by C-G formula based on SCr of 0.83 mg/dL). Liver Function Tests: Recent Labs  Lab 09/29/19 1858  AST 25  ALT 31  ALKPHOS 49  BILITOT 0.9  PROT 7.0  ALBUMIN 3.4*   No results for input(s): LIPASE, AMYLASE in the last 168 hours. No results for input(s): AMMONIA in the last 168 hours. Coagulation Profile: No results for input(s): INR, PROTIME in  the last 168 hours. Cardiac Enzymes: No results for input(s): CKTOTAL, CKMB, CKMBINDEX, TROPONINI in the last 168 hours. BNP (last 3 results) No results for input(s): PROBNP in the last 8760 hours. HbA1C: No results for input(s): HGBA1C in the last 72 hours. CBG: No results for input(s): GLUCAP in the last 168 hours. Lipid Profile: Recent Labs    09/29/19 1858  TRIG 114   Thyroid Function Tests: No results for input(s): TSH, T4TOTAL, FREET4, T3FREE, THYROIDAB in the last 72 hours. Anemia Panel: Recent Labs    09/29/19 1858  FERRITIN 99   Urine analysis:    Component Value Date/Time   COLORURINE YELLOW 10/12/2013 1220   APPEARANCEUR CLOUDY (A) 10/12/2013 1220   LABSPEC  1.018 10/12/2013 1220   PHURINE 6.5 10/12/2013 1220   GLUCOSEU NEGATIVE 10/12/2013 1220   HGBUR NEGATIVE 10/12/2013 1220   BILIRUBINUR NEGATIVE 10/12/2013 1220   KETONESUR NEGATIVE 10/12/2013 1220   PROTEINUR NEGATIVE 10/12/2013 1220   UROBILINOGEN 0.2 10/12/2013 1220   NITRITE NEGATIVE 10/12/2013 1220   LEUKOCYTESUR NEGATIVE 10/12/2013 1220   Sepsis Labs: @LABRCNTIP (procalcitonin:4,lacticidven:4) ) Recent Results (from the past 240 hour(s))  Blood Culture (routine x 2)     Status: None (Preliminary result)   Collection Time: 09/29/19  6:55 PM   Specimen: Left Antecubital; Blood  Result Value Ref Range Status   Specimen Description   Final    LEFT ANTECUBITAL Performed at [facility], [address] [place], [place]    Special Requests   Final    BOTTLES DRAWN AEROBIC AND ANAEROBIC Blood Culture adequate volume Performed at [facility] [address] [place], [place]    Culture   Final    NO GROWTH < 12 HOURS Performed at [facility], [address] [place], [place]    Report Status PENDING  Incomplete  Blood Culture (routine x 2)     Status: None (Preliminary result)   Collection Time: 09/29/19  6:55 PM   Specimen: Right  Antecubital; Blood  Result Value Ref Range Status   Specimen Description   Final    RIGHT ANTECUBITAL Performed at [facility], [address] [place], [place]    Special Requests   Final    BOTTLES DRAWN AEROBIC AND ANAEROBIC Blood Culture adequate volume Performed at [facility], [address] [place], [place]    Culture   Final    NO GROWTH < 12 HOURS Performed at [facility], [address] [place], [place]    Report Status PENDING  Incomplete  SARS Coronavirus 2 by RT PCR (hospital order, performed in [facility] hospital lab) Nasopharyngeal Nasopharyngeal Swab     Status: Abnormal   Collection Time: 09/29/19  6:58 PM   Specimen: Nasopharyngeal Swab  Result Value Ref Range Status   SARS Coronavirus 2 POSITIVE (A) NEGATIVE Final    Comment: RESULT CALLED TO, READ BACK BY AND VERIFIED WITH: KATIE CRANSTON RN @2020  09/29/2019 OLSONM (NOTE) SARS-CoV-2 target nucleic acids are DETECTED  SARS-CoV-2 RNA is generally detectable in upper respiratory specimens  during the acute phase of infection.  Positive results are indicative  of the presence of the identified virus, but do not rule out bacterial infection or co-infection with other pathogens not detected by the test.  Clinical correlation with patient history and  other diagnostic information is necessary to determine patient infection status.  The expected result is negative.  Fact Sheet for Patients:     Fact Sheet for Healthcare Providers:   10/01/2019    This test is not yet approved or cleared by the [employer] FDA and  has been authorized for detection and/or diagnosis of SARS-CoV-2 by FDA under an Emergency Use Authorization (EUA).  This EUA will remain in effect (meaning  this test can be used) for the duration of  the COVID-19 declaration under Section 564(b)(1) of the Act,  21 U.S.C. section 360-bbb-3(b)(1), unless the authorization is terminated or revoked sooner.  Performed at [facility], [address] [place], [address] [place]   Group A Strep by PCR  Status: None   Collection Time: 09/29/19  7:04 PM   Specimen: Throat; Sterile Swab  Result Value Ref Range Status   Group A Strep by PCR NOT DETECTED NOT DETECTED Final    Comment: Performed at [facility], [address] [place], [place] [address]     Radiological Exams on Admission: DG Chest Port 1 View  Result Date: 09/29/2019 CLINICAL DATA:  Shortness of breath EXAM: PORTABLE CHEST 1 VIEW COMPARISON:  Chest CT 05/01/2019 FINDINGS: Multifocal patchy ill-defined opacities within both lungs. Upper normal heart size. Normal mediastinal contours. Mild peribronchial thickening. No pneumothorax or large pleural effusion. No acute osseous abnormalities are seen. IMPRESSION: 1. Multifocal patchy ill-defined opacities within both lungs, suspicious for multifocal pneumonia, including COVID-19. 2. Peribronchial thickening may be related smoking history or less likely pulmonary edema. Electronically Signed   By: Narda RutherfordMelanie  Sanford M.D.   On: 09/29/2019 19:30    EKG: Independently reviewed. Sinus tachycardia  Assessment/Plan Principal Problem:   Acute hypoxemic respiratory failure due to COVID-19 [facility](HCC) Active Problems:   Asthma   DDD (degenerative disc disease), lumbar   GERD (gastroesophageal reflux disease)   Hyperlipidemia     #1 acute hypoxic respiratory failure due to COVID-19: [facility].  Initiate remdesivir, dexamethasone, breathing treatment, Rocephin and Zithromax.  Patient also to be maintained on his oxygen.  He appears to have undiagnosed obstructive sleep apnea.  His oxygen sat usually drops when he is sleeping.  He will require outpatient sleep study with BiPAP or CPAP rather.  In the meantime continue to titrate oxygen down.  #2  history of asthma: No evidence of exacerbation.  Poor air entry with rales only.  Continue empiric treatment  #3 degenerative disc disease: Continue home regimen  #4 morbid obesity: Dietary counseling  #5 GERD: Continue with PPIs  #6 hyperlipidemia: Continue statin   DVT prophylaxis: Lovenox  Code Status: Full  Family Communication: No family at bedside  Disposition Plan: Home  Consults called: None  Admission status: Inpatient   Severity of Illness: The appropriate patient status for this patient is INPATIENT. Inpatient status is judged to be reasonable and necessary in order to provide the required intensity of service to ensure the patient's safety. The patient's presenting symptoms, physical exam findings, and initial radiographic and laboratory data in the context of their chronic comorbidities is felt to place them at high risk for further clinical deterioration. Furthermore, it is not anticipated that the patient will be medically stable for discharge from the hospital within 2 midnights of admission. The following factors support the patient status of inpatient.   " The patient's presenting symptoms include Shortness of Breath. " The worrisome physical exam findings include Coarse breath sounds with rales. " The initial radiographic and laboratory data are worrisome because of Bilateral infiltrates. " The chronic co-morbidities include Asthma.   * I certify that at the point of admission it is my clinical judgment that the patient will require inpatient hospital care spanning beyond 2 midnights from the point of admission due to high intensity of service, high risk for further deterioration and high frequency of surveillance required.Lonia Blood*    Damacio Weisgerber,LAWAL MD [facility] Pager [phone]  If 7PM-7AM, please contact night-coverage [email or web address] Password [email or web address]  09/30/2019, 7:23 PM

## 2019-09-30 NOTE — ED Notes (Signed)
Patient desat into 70's while sleeping.  Placed on 15l/m HFNC and NRB for additional O2 support.  BBS coarse, expectorating white secretions.

## 2019-10-01 DIAGNOSIS — J9601 Acute respiratory failure with hypoxia: Secondary | ICD-10-CM

## 2019-10-01 DIAGNOSIS — U071 COVID-19: Principal | ICD-10-CM

## 2019-10-01 LAB — CBC WITH DIFFERENTIAL/PLATELET
Abs Immature Granulocytes: 0.03 10*3/uL (ref 0.00–0.07)
Basophils Absolute: 0 10*3/uL (ref 0.0–0.1)
Basophils Relative: 0 %
Eosinophils Absolute: 0 10*3/uL (ref 0.0–0.5)
Eosinophils Relative: 0 %
HCT: 42.6 % (ref 39.0–52.0)
Hemoglobin: 13.1 g/dL (ref 13.0–17.0)
Immature Granulocytes: 0 %
Lymphocytes Relative: 15 %
Lymphs Abs: 1.2 10*3/uL (ref 0.7–4.0)
MCH: 28.6 pg (ref 26.0–34.0)
MCHC: 30.8 g/dL (ref 30.0–36.0)
MCV: 93 fL (ref 80.0–100.0)
Monocytes Absolute: 0.4 10*3/uL (ref 0.1–1.0)
Monocytes Relative: 5 %
Neutro Abs: 6.3 10*3/uL (ref 1.7–7.7)
Neutrophils Relative %: 80 %
Platelets: 133 10*3/uL — ABNORMAL LOW (ref 150–400)
RBC: 4.58 MIL/uL (ref 4.22–5.81)
RDW: 15.9 % — ABNORMAL HIGH (ref 11.5–15.5)
WBC: 7.8 10*3/uL (ref 4.0–10.5)
nRBC: 0.4 % — ABNORMAL HIGH (ref 0.0–0.2)

## 2019-10-01 LAB — C-REACTIVE PROTEIN: CRP: 9 mg/dL — ABNORMAL HIGH (ref <1.0)

## 2019-10-01 LAB — COMPREHENSIVE METABOLIC PANEL
ALT: 25 U/L (ref 0–44)
AST: 29 U/L (ref 15–41)
Albumin: 2.9 g/dL — ABNORMAL LOW (ref 3.5–5.0)
Alkaline Phosphatase: 42 U/L (ref 38–126)
Anion gap: 10 (ref 5–15)
BUN: 20 mg/dL (ref 6–20)
CO2: 30 mmol/L (ref 22–32)
Calcium: 8.3 mg/dL — ABNORMAL LOW (ref 8.9–10.3)
Chloride: 96 mmol/L — ABNORMAL LOW (ref 98–111)
Creatinine, Ser: 0.77 mg/dL (ref 0.61–1.24)
GFR calc Af Amer: >60 mL/min (ref >60)
GFR calc non Af Amer: >60 mL/min (ref >60)
Glucose, Bld: 150 mg/dL — ABNORMAL HIGH (ref 70–99)
Potassium: 4 mmol/L (ref 3.5–5.1)
Sodium: 136 mmol/L (ref 135–145)
Total Bilirubin: 0.8 mg/dL (ref 0.3–1.2)
Total Protein: 6.4 g/dL — ABNORMAL LOW (ref 6.5–8.1)

## 2019-10-01 LAB — PHOSPHORUS: Phosphorus: 3.5 mg/dL (ref 2.5–4.6)

## 2019-10-01 LAB — FERRITIN: Ferritin: 177 ng/mL (ref 24–336)

## 2019-10-01 LAB — MAGNESIUM: Magnesium: 2 mg/dL (ref 1.7–2.4)

## 2019-10-01 LAB — D-DIMER, QUANTITATIVE: D-Dimer, Quant: 1.51 ug/mL-FEU — ABNORMAL HIGH (ref 0.00–0.50)

## 2019-10-01 MED ORDER — ZINC SULFATE 220 (50 ZN) MG PO CAPS
220.0000 mg | ORAL_CAPSULE | Freq: Every day | ORAL | Status: DC
  Administered 2019-10-01 – 2019-10-02 (×2): 220 mg via ORAL
  Filled 2019-10-01 (×2): qty 1

## 2019-10-01 MED ORDER — FOLIC ACID 1 MG PO TABS
1.0000 mg | ORAL_TABLET | Freq: Every day | ORAL | Status: DC
  Administered 2019-10-02: 1 mg via ORAL
  Filled 2019-10-01: qty 1

## 2019-10-01 MED ORDER — ASCORBIC ACID 500 MG PO TABS
500.0000 mg | ORAL_TABLET | Freq: Every day | ORAL | Status: DC
  Administered 2019-10-01 – 2019-10-02 (×2): 500 mg via ORAL
  Filled 2019-10-01 (×2): qty 1

## 2019-10-01 NOTE — Plan of Care (Signed)
  Problem: Education: Goal: Knowledge of risk factors and measures for prevention of condition will improve Outcome: Progressing   Problem: Coping: Goal: Psychosocial and spiritual needs will be supported Outcome: Progressing   Problem: Respiratory: Goal: Will maintain a patent airway Outcome: Progressing Goal: Complications related to the disease process, condition or treatment will be avoided or minimized Outcome: Progressing   

## 2019-10-01 NOTE — Progress Notes (Signed)
PROGRESS NOTE    Frank PersiaCharles Robinson  ZOX:[id number] DOB: 05/23/1980 DOA: 09/29/2019 PCP: Maye HidesMiller, Ryan Dean, PA   Brief Narrative:  Frank PersiaCharles Robinson is a 39 y.o. male with medical history significant of Asthma, DDD, GERD and Hyperlipidemia who [facility] with shortness of breath cough and wheezing.  Symptoms have been going on for couple of days. Also fever with some chills. Dental Pain As Well As Sore Throat.  He Also Had Loss of Tests.  On Arrival in the ER His Oxygen Sats on Room Air Was 78%.  Oxygen saturation dropped to 66% with limited mobility.  He was placed on oxygen initially up to 6 L but currently off to 15 L.  He is requiring oxygen by nonrebreather bag when he sleeps.  Patient is currently screen was positive with bilateral infiltrates suggestive of Covid pneumonia.  He was not vaccinated against Covid 19. He was transferred to the hospital for evaluation and treatment. Temperature is 100.4, blood pressure was 62/127, pulse 113, respiratory 26, oxygen sats 92% on 6 L. Sodium 134 potassium 4.0 chloride 95 CO2 29 glucose 139.  Unfortunately creatinine 0.83.  LFT's appeared to be within normal. LDH 221 triglyceride 114 ferritin 99.  CRP 13.3 lactic acid 1.2 protested and 13.49. D-dimer 0.86 fibrinogen 482.  Covid 19 is positive.  CXR that showed bilateral infiltrates.   Assessment & Plan:   Principal Problem:   Acute hypoxemic respiratory failure due to COVID-19 [facility](HCC) Active Problems:   Asthma   DDD (degenerative disc disease), lumbar   GERD (gastroesophageal reflux disease)   Hyperlipidemia   AHRF - sepsis secondary to multifocal community-acquired pneumonia, POA Covid positive -likely concurrent viral pna overlying -Patient had tachypnea tachycardia, mild fever and notable infection given pneumonia -Multifocal pneumonia on imaging, at home sick x1 week likely prolonged illness -Given prolonged illness concern for post viral bacterial pneumonia elevated  procalcitonin at 13 -Continue azithromycin, ceftriaxone, Remdesivir, steroids given concern for both bacterial and viral component pneumonia -Given procalcitonin positive we will hold off on baricitinib -Continue supportive care, wean oxygen aggressively, patient able to wean down to 7 L nasal cannula at rest today -if he continues to progress rapidly he is interested in finishing Remdesivir in the outpatient setting SpO2: 99 % O2 Flow Rate (L/min): 15 L/min FiO2 (%): 100 %  Recent Labs    09/29/19 1858 10/01/19 0441  DDIMER 0.86* 1.51*  FERRITIN 99 177  LDH 221*  --   CRP 13.3* 9.0*   History of asthma, no acute exacerbation -Clinically patient without wheeze, treatment as above, unable to offer nebs given Covid positive status  Degenerative disc disease, chronic -No acute process, currently not on analgesics at home other than OTCs on occasion  Obesity Body mass index is 29.84 kg/m.  GERD -Continue supportive care, no current PPI noted at home  Hyperlipidemia -Continue dietary control, consider statin in the outpatient therapy, unclear why patient is not currently on statin medication  DVT prophylaxis: Lovenox Code Status: Full Family Communication: Patient to update  Status is: Inpatient  Dispo: The patient is from: Home              Anticipated d/c is to: Home              Anticipated d/c date is: 24 to 48 hours              Patient currently not medically stable for discharge given ongoing hypoxia dyspnea with exertion in setting of acute Covid  pneumonia with concurrent sepsis and bacterial pneumonia.  Consultants:   None  Procedures:   None planned  Antimicrobials:  Azithromycin, ceftriaxone, Remdesivir  Subjective: No acute issues or events overnight, denies nausea, vomiting, diarrhea, constipation, headache, fevers, chills.  Objective: Vitals:   10/01/19 0207 10/01/19 0623 10/01/19 0758 10/01/19 0955  BP: (!) 117/94 118/67  128/79  Pulse: 77 68   74  Resp: 16 14  16   Temp: 98.8 F (37.1 C) 99.1 F (37.3 C)  98.7 F (37.1 C)  TempSrc: Oral Oral  Oral  SpO2: 100% 100% 97% 99%  Weight:      Height:        Intake/Output Summary (Last 24 hours) at 10/01/2019 1246 Last data filed at 10/01/2019 0900 Gross per 24 hour  Intake 765.61 ml  Output --  Net 765.61 ml   Filed Weights   09/29/19 1843 09/30/19 1813  Weight: 99.8 kg 99.8 kg    Examination:  General exam: Appears calm and comfortable  Respiratory system: Clear to auscultation. Respiratory effort normal. Cardiovascular system: S1 & S2 heard, RRR. No JVD, murmurs, rubs, gallops or clicks. No pedal edema. Gastrointestinal system: Abdomen is nondistended, soft and nontender. No organomegaly or masses felt. Normal bowel sounds heard. Central nervous system: Alert and oriented. No focal neurological deficits. Extremities: Symmetric 5 x 5 power. Skin: No rashes, lesions or ulcers Psychiatry: Judgement and insight appear normal. Mood & affect appropriate.     Data Reviewed: I have personally reviewed following labs and imaging studies  CBC: Recent Labs  Lab 09/29/19 1858 10/01/19 0441  WBC 6.3 7.8  NEUTROABS 5.0 6.3  HGB 14.4 13.1  HCT 46.9 42.6  MCV 92.3 93.0  PLT 170 133*   Basic Metabolic Panel: Recent Labs  Lab 09/29/19 1858 10/01/19 0441  NA 134* 136  K 4.0 4.0  CL 95* 96*  CO2 29 30  GLUCOSE 139* 150*  BUN 14 20  CREATININE 0.83 0.77  CALCIUM 8.3* 8.3*  MG  --  2.0  PHOS  --  3.5   GFR: Estimated Creatinine Clearance: 153.2 mL/min (by C-G formula based on SCr of 0.77 mg/dL). Liver Function Tests: Recent Labs  Lab 09/29/19 1858 10/01/19 0441  AST 25 29  ALT 31 25  ALKPHOS 49 42  BILITOT 0.9 0.8  PROT 7.0 6.4*  ALBUMIN 3.4* 2.9*   No results for input(s): LIPASE, AMYLASE in the last 168 hours. No results for input(s): AMMONIA in the last 168 hours. Coagulation Profile: No results for input(s): INR, PROTIME in the last 168  hours. Cardiac Enzymes: No results for input(s): CKTOTAL, CKMB, CKMBINDEX, TROPONINI in the last 168 hours. BNP (last 3 results) No results for input(s): PROBNP in the last 8760 hours. HbA1C: No results for input(s): HGBA1C in the last 72 hours. CBG: No results for input(s): GLUCAP in the last 168 hours. Lipid Profile: Recent Labs    09/29/19 1858  TRIG 114   Thyroid Function Tests: No results for input(s): TSH, T4TOTAL, FREET4, T3FREE, THYROIDAB in the last 72 hours. Anemia Panel: Recent Labs    09/29/19 1858 10/01/19 0441  FERRITIN 99 177   Sepsis Labs: Recent Labs  Lab 09/29/19 1858  PROCALCITON 13.49  LATICACIDVEN 1.2    Recent Results (from the past 240 hour(s))  Blood Culture (routine x 2)     Status: None (Preliminary result)   Collection Time: 09/29/19  6:55 PM   Specimen: Left Antecubital; Blood  Result Value Ref Range Status  Specimen Description   Final    LEFT ANTECUBITAL Performed at [facility], [address] [place], [place] [address]    Special Requests   Final    BOTTLES DRAWN AEROBIC AND ANAEROBIC Blood Culture adequate volume Performed at [facility], [address] [place], [place] [address]    Culture   Final    NO GROWTH 2 DAYS Performed at [facility], [address], [place], [place] [address]    Report Status PENDING  Incomplete  Blood Culture (routine x 2)     Status: None (Preliminary result)   Collection Time: 09/29/19  6:55 PM   Specimen: Right Antecubital; Blood  Result Value Ref Range Status   Specimen Description   Final    RIGHT ANTECUBITAL Performed at [facility], [address] [place], [place] [address]    Special Requests   Final    BOTTLES DRAWN AEROBIC AND ANAEROBIC Blood Culture adequate volume Performed at [facility], [address] [place], [place] [address]    Culture   Final    NO GROWTH 2 DAYS Performed at [facility], [address],  [place], [place] [address]    Report Status PENDING  Incomplete  SARS Coronavirus 2 by RT PCR (hospital order, performed in [facility] hospital lab) Nasopharyngeal Nasopharyngeal Swab     Status: Abnormal   Collection Time: 09/29/19  6:58 PM   Specimen: Nasopharyngeal Swab  Result Value Ref Range Status   SARS Coronavirus 2 POSITIVE (A) NEGATIVE Final    Comment: RESULT CALLED TO, READ BACK BY AND VERIFIED WITH: KATIE CRANSTON RN @2020  09/29/2019 OLSONM (NOTE) SARS-CoV-2 target nucleic acids are DETECTED  SARS-CoV-2 RNA is generally detectable in upper respiratory specimens  during the acute phase of infection.  Positive results are indicative  of the presence of the identified virus, but do not rule out bacterial infection or co-infection with other pathogens not detected by the test.  Clinical correlation with patient history and  other diagnostic information is necessary to determine patient infection status.  The expected result is negative.  Fact Sheet for Patients:   10/01/2019   Fact Sheet for Healthcare Providers:   BoilerBrush.com.cy    This test is not yet approved or cleared by the https://pope.com/ FDA and  has been authorized for detection and/or diagnosis of SARS-CoV-2 by FDA under an Emergency Use Authorization (EUA).  This EUA will remain in effect (meaning  this test can be used) for the duration of  the COVID-19 declaration under Section 564(b)(1) of the Act, 21 U.S.C. section 360-bbb-3(b)(1), unless the authorization is terminated or revoked sooner.  Performed at [facility], [address] [place], [place]   Group A Strep by PCR     Status: None   Collection Time: 09/29/19  7:04 PM   Specimen: Throat; Sterile Swab  Result Value Ref Range Status   Group A Strep by PCR NOT DETECTED NOT DETECTED Final    Comment: Performed at [facility], [address], [place], [place]  [place]         Radiology Studies: DG Chest Port 1 View  Result Date: 09/29/2019 CLINICAL DATA:  Shortness of breath EXAM: PORTABLE CHEST 1 VIEW COMPARISON:  Chest CT 05/01/2019 FINDINGS: Multifocal patchy ill-defined opacities within both lungs. Upper normal heart size. Normal mediastinal contours. Mild peribronchial thickening. No pneumothorax or  large pleural effusion. No acute osseous abnormalities are seen. IMPRESSION: 1. Multifocal patchy ill-defined opacities within both lungs, suspicious for multifocal pneumonia, including COVID-19. 2. Peribronchial thickening may be related smoking history or less likely pulmonary edema. Electronically Signed   By: Narda Rutherford M.D.   On: 09/29/2019 19:30        Scheduled Meds: . dexamethasone (DECADRON) injection  6 mg Intravenous Q24H  . enoxaparin (LOVENOX) injection  40 mg Subcutaneous Q24H  . folic acid  1 mg Intravenous Daily  . Ipratropium-Albuterol  1 puff Inhalation Q6H   Continuous Infusions: . sodium chloride 50 mL/hr at 10/01/19 0200  . sodium chloride Stopped (09/30/19 0531)  . sodium chloride Stopped (09/30/19 1318)  . azithromycin Stopped (09/30/19 2335)  . cefTRIAXone (ROCEPHIN)  IV Stopped (09/30/19 2210)  . remdesivir 100 mg in NS 100 mL 100 mg (10/01/19 0952)     LOS: 1 day   Time spent:  Azucena Fallen, DO [facility]  If 7PM-7AM, please contact night-coverage [email or web address]  10/01/2019, 12:46 PM

## 2019-10-02 DIAGNOSIS — J9601 Acute respiratory failure with hypoxia: Secondary | ICD-10-CM

## 2019-10-02 DIAGNOSIS — U071 COVID-19: Principal | ICD-10-CM

## 2019-10-02 LAB — COMPREHENSIVE METABOLIC PANEL
ALT: 25 U/L (ref 0–44)
AST: 26 U/L (ref 15–41)
Albumin: 3 g/dL — ABNORMAL LOW (ref 3.5–5.0)
Alkaline Phosphatase: 41 U/L (ref 38–126)
Anion gap: 10 (ref 5–15)
BUN: 17 mg/dL (ref 6–20)
CO2: 29 mmol/L (ref 22–32)
Calcium: 8.5 mg/dL — ABNORMAL LOW (ref 8.9–10.3)
Chloride: 98 mmol/L (ref 98–111)
Creatinine, Ser: 0.72 mg/dL (ref 0.61–1.24)
GFR calc Af Amer: >60 mL/min (ref >60)
GFR calc non Af Amer: >60 mL/min (ref >60)
Glucose, Bld: 141 mg/dL — ABNORMAL HIGH (ref 70–99)
Potassium: 3.9 mmol/L (ref 3.5–5.1)
Sodium: 137 mmol/L (ref 135–145)
Total Bilirubin: 0.6 mg/dL (ref 0.3–1.2)
Total Protein: 6.3 g/dL — ABNORMAL LOW (ref 6.5–8.1)

## 2019-10-02 LAB — CBC WITH DIFFERENTIAL/PLATELET
Abs Immature Granulocytes: 0.03 10*3/uL (ref 0.00–0.07)
Basophils Absolute: 0 10*3/uL (ref 0.0–0.1)
Basophils Relative: 0 %
Eosinophils Absolute: 0 10*3/uL (ref 0.0–0.5)
Eosinophils Relative: 0 %
HCT: 40.7 % (ref 39.0–52.0)
Hemoglobin: 12.7 g/dL — ABNORMAL LOW (ref 13.0–17.0)
Immature Granulocytes: 0 %
Lymphocytes Relative: 17 %
Lymphs Abs: 1.2 10*3/uL (ref 0.7–4.0)
MCH: 28.7 pg (ref 26.0–34.0)
MCHC: 31.2 g/dL (ref 30.0–36.0)
MCV: 91.9 fL (ref 80.0–100.0)
Monocytes Absolute: 0.3 10*3/uL (ref 0.1–1.0)
Monocytes Relative: 4 %
Neutro Abs: 5.8 10*3/uL (ref 1.7–7.7)
Neutrophils Relative %: 79 %
Platelets: 143 10*3/uL — ABNORMAL LOW (ref 150–400)
RBC: 4.43 MIL/uL (ref 4.22–5.81)
RDW: 15.7 % — ABNORMAL HIGH (ref 11.5–15.5)
WBC: 7.4 10*3/uL (ref 4.0–10.5)
nRBC: 0.3 % — ABNORMAL HIGH (ref 0.0–0.2)

## 2019-10-02 LAB — D-DIMER, QUANTITATIVE: D-Dimer, Quant: 0.92 ug/mL-FEU — ABNORMAL HIGH (ref 0.00–0.50)

## 2019-10-02 LAB — MAGNESIUM: Magnesium: 2 mg/dL (ref 1.7–2.4)

## 2019-10-02 LAB — FERRITIN: Ferritin: 160 ng/mL (ref 24–336)

## 2019-10-02 LAB — C-REACTIVE PROTEIN: CRP: 4.7 mg/dL — ABNORMAL HIGH (ref <1.0)

## 2019-10-02 LAB — PHOSPHORUS: Phosphorus: 3.3 mg/dL (ref 2.5–4.6)

## 2019-10-02 MED ORDER — AMOXICILLIN-POT CLAVULANATE 875-125 MG PO TABS: 1.0000 | ORAL_TABLET | Freq: Two times a day (BID) | ORAL | 0 refills | Status: AC

## 2019-10-02 MED ORDER — DEXAMETHASONE 6 MG PO TABS: 6.0000 mg | ORAL_TABLET | Freq: Every day | ORAL | 0 refills | Status: AC

## 2019-10-02 NOTE — Progress Notes (Signed)
Patient scheduled for outpatient Remdesivir infusion at 12pm, Thursday 9/2 at [facility]. Please inform the patient to park at [address] [place], [place], as staff will be escorting the patient through the east entrance of the hospital.    There is a wave flag banner located near the entrance on [address]. Turn into this entrance and immediately turn left and park in 1 of the 5 designated Covid Infusion Parking spots. There is a phone number on the sign, please call and let the staff know what spot you are in and we will come out and get you. For questions call [phone].  Thanks.

## 2019-10-02 NOTE — Progress Notes (Signed)
SATURATION QUALIFICATIONS: (This note is used to comply with regulatory documentation for home oxygen)  Patient Saturations on Room Air at Rest = 90%  Patient Saturations on Room Air while Ambulating = 87%  Patient Saturations on 4 Liters of oxygen while Ambulating = 92%  Please briefly explain why patient needs home oxygen:

## 2019-10-02 NOTE — Discharge Summary (Addendum)
Physician Discharge Summary  Frank Robinson QIO:[id number] DOB: 04/11/1980 DOA: 09/29/2019  PCP: Maye Hides, PA  Admit date: 09/29/2019 Discharge date: 10/02/2019  Admitted From: Home Disposition: Home  Recommendations for Outpatient Follow-up:  1. Follow up with PCP in 1-2 weeks As scheduled for sleep study, follow-up with primary care physician Home Health: Not applicable Equipment/Devices: Oxygen 4 L/min  Discharge Condition: Stable CODE STATUS: Full code Diet recommendation: Low-salt diet  Discharge summary: 39 year old gentleman with history of mild intermittent asthma, GERD and hyperlipidemia presented to the ER with about 1 week of sore throat and progressive shortness of breath cough and wheezing.  Initially in the emergency room he was 78% on room air.  Placed on 6 L of oxygen and admitted to the hospital.  COVID-19 positive.  Not vaccinated against COVID-19.  Chest x-ray with bilateral infiltrates.  Other serologies were within normal limits, procalcitonin was 13.  Patient was admitted to the hospital with acute hypoxemic failure secondary to COVID-19 pneumonia, he was also treated with Rocephin and azithromycin due to elevated procalcitonin on arrival.  Patient did good clinical recovery.  He is a still requiring supplemental oxygen on mobility, however he has done well and symptomatically improved so he wants to go home.  Plan: Due to symptomatic improvement, patient is discharged home with all instructions. He will be prescribed supplemental oxygen 4 L/min on mobility to use at home until clinical improvement. Patient has received 4 days of remdesivir therapy today, would like to give him fifth dose tomorrow at infusion center and this is a scheduled. We will finish low-dose steroids 7 more days with total 10 days of therapy. Given elevated procalcitonin, he was treated with Rocephin azithromycin for 2 days, will prescribe Augmentin for 7 days to cover for secondary  bacterial pneumonia. He probably has underlying sleep apnea, he was advised to follow-up with his primary care physician for referral.   Addendum, 10/09/2019 4:42 PM: This addendum is only done to satisfy documentation clarification. Sepsis ruled out.  Discharge Diagnoses:  Principal Problem:   Acute hypoxemic respiratory failure due to COVID-19 [facility]) Active Problems:   Asthma   DDD (degenerative disc disease), lumbar   GERD (gastroesophageal reflux disease)   Hyperlipidemia    Discharge Instructions  Discharge Instructions    Call MD for:  difficulty breathing, headache or visual disturbances   Complete by: As directed    Call MD for:  extreme fatigue   Complete by: As directed    Call MD for:  persistant dizziness or light-headedness   Complete by: As directed    Call MD for:  temperature >100.4   Complete by: As directed    Diet - low sodium heart healthy   Complete by: As directed    Discharge instructions   Complete by: As directed    You can use over-the-counter cough medications and Tylenol as needed. Please follow-up with your primary care physician for sleep study, we suspect you may have underlying sleep apnea. Total isolation 3 weeks from your positive test for COVID-19.   Increase activity slowly   Complete by: As directed      Allergies as of 10/02/2019      Reactions   Latex Rash      Medication List    STOP taking these medications   cephALEXin 500 MG capsule Commonly known as: KEFLEX   HYDROcodone-acetaminophen 5-325 MG tablet Commonly known as: NORCO/VICODIN   ibuprofen 800 MG tablet Commonly known as: ADVIL  TAKE these medications   acetaminophen 500 MG tablet Commonly known as: TYLENOL Take 500 mg by mouth every 6 (six) hours as needed for moderate pain or headache.   amoxicillin-clavulanate 875-125 MG tablet Commonly known as: Augmentin Take 1 tablet by mouth 2 (two) times daily for 7 days.   dexamethasone 6 MG tablet Commonly  known as: Decadron Take 1 tablet (6 mg total) by mouth daily for 7 days.   HEARTBURN RELIEF PO Take 1 tablet by mouth daily as needed (heartburn).            Durable Medical Equipment  (From admission, onward)         Start     Ordered   10/02/19 1324  DME Oxygen  Once       Comments: Patient Saturations on Room Air at Rest = 90%  Patient Saturations on [employer] while Ambulating = 87%  Patient Saturations on 4 Liters of oxygen while Ambulating = 92%  Question Answer Comment  Length of Need 6 Months   Mode or (Route) Nasal cannula   Liters per Minute 4   Frequency Continuous (stationary and portable oxygen unit needed)   Oxygen conserving device Yes   Oxygen delivery system Gas      10/02/19 1325          Follow-up Information    Maye Hides, PA Follow up in 2 week(s).   Specialty: Physician Assistant Contact information: [phone] [place] [address] [phone]              Allergies  Allergen Reactions  . Latex Rash    Consultations:  None   Procedures/Studies: DG Chest Port 1 View  Result Date: 09/29/2019 CLINICAL DATA:  Shortness of breath EXAM: PORTABLE CHEST 1 VIEW COMPARISON:  Chest CT 05/01/2019 FINDINGS: Multifocal patchy ill-defined opacities within both lungs. Upper normal heart size. Normal mediastinal contours. Mild peribronchial thickening. No pneumothorax or large pleural effusion. No acute osseous abnormalities are seen. IMPRESSION: 1. Multifocal patchy ill-defined opacities within both lungs, suspicious for multifocal pneumonia, including COVID-19. 2. Peribronchial thickening may be related smoking history or less likely pulmonary edema. Electronically Signed   By: Narda Rutherford M.D.   On: 09/29/2019 19:30    (Echo, Carotid, EGD, Colonoscopy, ERCP)    Subjective: Patient was seen and examined.  No overnight events.  On my interview, he was up and eating breakfast, then he was walking with the nurses and  saturations were about 87% needed 4 L to improve.  Patient stated that he is doing well and his symptoms have improved and he wants to go home. Tomorrow is his birthday and wants to spend with his family.   Discharge Exam: Vitals:   10/02/19 1000 10/02/19 1250  BP:  134/77  Pulse:  72  Resp:  20  Temp:  98.2 F (36.8 C)  SpO2: 92% 94%   Vitals:   10/01/19 2049 10/02/19 0607 10/02/19 1000 10/02/19 1250  BP: 132/80 135/77  134/77  Pulse: 72 74  72  Resp: Temp: 98.6 F (37 C) 98.3 F (36.8 C)  98.2 F (36.8 C)  TempSrc: Oral Oral  Oral  SpO2: 94% 97% 92% 94%  Weight:      Height:        General: Pt is alert, awake, not in acute distress Looks comfortable on 4 L.  Talking in full sentences and able to walk around. Cardiovascular: RRR, S1/S2 +, no rubs,  no gallops, no added sounds Respiratory: CTA bilaterally, no wheezing, no rhonchi Abdominal: Soft, NT, ND, bowel sounds + Extremities: no edema, no cyanosis    The results of significant diagnostics from this hospitalization (including imaging, microbiology, ancillary and laboratory) are listed below for reference.     Microbiology: Recent Results (from the past 240 hour(s))  Blood Culture (routine x 2)     Status: None (Preliminary result)   Collection Time: 09/29/19  6:55 PM   Specimen: Left Antecubital; Blood  Result Value Ref Range Status   Specimen Description   Final    LEFT ANTECUBITAL Performed at [facility], [address] [place], [place] [address]    Special Requests   Final    BOTTLES DRAWN AEROBIC AND ANAEROBIC Blood Culture adequate volume Performed at [facility], [address] [place], [place] [address]    Culture   Final    NO GROWTH 3 DAYS Performed at [facility], [address] [place], [place] [address]    Report Status PENDING  Incomplete  Blood Culture (routine x 2)     Status: None (Preliminary result)   Collection Time: 09/29/19  6:55 PM    Specimen: Right Antecubital; Blood  Result Value Ref Range Status   Specimen Description   Final    RIGHT ANTECUBITAL Performed at [facility], [address] [place], [place] [address]    Special Requests   Final    BOTTLES DRAWN AEROBIC AND ANAEROBIC Blood Culture adequate volume Performed at [facility], [address] [place], [place] [address]    Culture   Final    NO GROWTH 3 DAYS Performed at [facility], [address], [place], [place] [address]    Report Status PENDING  Incomplete  SARS Coronavirus 2 by RT PCR (hospital order, performed in [facility] hospital lab) Nasopharyngeal Nasopharyngeal Swab     Status: Abnormal   Collection Time: 09/29/19  6:58 PM   Specimen: Nasopharyngeal Swab  Result Value Ref Range Status   SARS Coronavirus 2 POSITIVE (A) NEGATIVE Final    Comment: RESULT CALLED TO, READ BACK BY AND VERIFIED WITH: KATIE CRANSTON RN @2020  09/29/2019 OLSONM (NOTE) SARS-CoV-2 target nucleic acids are DETECTED  SARS-CoV-2 RNA is generally detectable in upper respiratory specimens  during the acute phase of infection.  Positive results are indicative  of the presence of the identified virus, but do not rule out bacterial infection or co-infection with other pathogens not detected by the test.  Clinical correlation with patient history and  other diagnostic information is necessary to determine patient infection status.  The expected result is negative.  Fact Sheet for Patients:   10/01/2019   Fact Sheet for Healthcare Providers:   BoilerBrush.com.cy    This test is not yet approved or cleared by the https://pope.com/ FDA and  has been authorized for detection and/or diagnosis of SARS-CoV-2 by FDA under an Emergency Use Authorization (EUA).  This EUA will remain in effect (meaning  this test can be used) for the duration of  the COVID-19 declaration under Section 564(b)(1) of  the Act, 21 U.S.C. section 360-bbb-3(b)(1), unless the authorization is terminated or revoked sooner.  Performed at [facility], [address] [place], [place]   Group A Strep by PCR     Status: None   Collection Time: 09/29/19  7:04 PM   Specimen: Throat;  Sterile Swab  Result Value Ref Range Status   Group A Strep by PCR NOT DETECTED NOT DETECTED Final    Comment: Performed at [facility], [address] [place], [place] [address]     Labs: BNP (last 3 results) No results for input(s): BNP in the last 8760 hours. Basic Metabolic Panel: Recent Labs  Lab 09/29/19 1858 10/01/19 0441 10/02/19 0441  NA 134* 136 137  K 4.0 4.0 3.9  CL 95* 96* 98  CO2 29 30 29   GLUCOSE 139* 150* 141*  BUN 14 20 17   CREATININE 0.83 0.77 0.72  CALCIUM 8.3* 8.3* 8.5*  MG  --  2.0 2.0  PHOS  --  3.5 3.3   Liver Function Tests: Recent Labs  Lab 09/29/19 1858 10/01/19 0441 10/02/19 0441  AST 25 29 26   ALT 31 25 25   ALKPHOS 49 42 41  BILITOT 0.9 0.8 0.6  PROT 7.0 6.4* 6.3*  ALBUMIN 3.4* 2.9* 3.0*   No results for input(s): LIPASE, AMYLASE in the last 168 hours. No results for input(s): AMMONIA in the last 168 hours. CBC: Recent Labs  Lab 09/29/19 1858 10/01/19 0441 10/02/19 0441  WBC 6.3 7.8 7.4  NEUTROABS 5.0 6.3 5.8  HGB 14.4 13.1 12.7*  HCT 46.9 42.6 40.7  MCV 92.3 93.0 91.9  PLT 170 133* 143*   Cardiac Enzymes: No results for input(s): CKTOTAL, CKMB, CKMBINDEX, TROPONINI in the last 168 hours. BNP: Invalid input(s): POCBNP CBG: No results for input(s): GLUCAP in the last 168 hours. D-Dimer Recent Labs    10/01/19 0441 10/02/19 0441  DDIMER 1.51* 0.92*   Hgb A1c No results for input(s): HGBA1C in the last 72 hours. Lipid Profile Recent Labs    09/29/19 1858  TRIG 114   Thyroid function studies No results for input(s): TSH, T4TOTAL, T3FREE, THYROIDAB in the last 72 hours.  Invalid input(s): FREET3 Anemia work up Recent  Labs    10/01/19 0441 10/02/19 0441  FERRITIN 177 160   Urinalysis    Component Value Date/Time   COLORURINE YELLOW 10/12/2013 1220   APPEARANCEUR CLOUDY (A) 10/12/2013 1220   LABSPEC 1.018 10/12/2013 1220   PHURINE 6.5 10/12/2013 1220   GLUCOSEU NEGATIVE 10/12/2013 1220   HGBUR NEGATIVE 10/12/2013 1220   BILIRUBINUR NEGATIVE 10/12/2013 1220   KETONESUR NEGATIVE 10/12/2013 1220   PROTEINUR NEGATIVE 10/12/2013 1220   UROBILINOGEN 0.2 10/12/2013 1220   NITRITE NEGATIVE 10/12/2013 1220   LEUKOCYTESUR NEGATIVE 10/12/2013 1220   Sepsis Labs Invalid input(s): PROCALCITONIN,  WBC,  LACTICIDVEN Microbiology Recent Results (from the past 240 hour(s))  Blood Culture (routine x 2)     Status: None (Preliminary result)   Collection Time: 09/29/19  6:55 PM   Specimen: Left Antecubital; Blood  Result Value Ref Range Status   Specimen Description   Final    LEFT ANTECUBITAL Performed at [facility], [address] [place], [facility] [address]    Special Requests   Final    BOTTLES DRAWN AEROBIC AND ANAEROBIC Blood Culture adequate volume Performed at [facility], [address] [place], [address] [facility]    Culture   Final    NO GROWTH 3 DAYS Performed at [facility], [address] [place], [address] [facility]    Report Status PENDING  Incomplete  Blood Culture (routine x 2)     Status: None (Preliminary result)   Collection Time: 09/29/19  6:55 PM   Specimen: Right  Antecubital; Blood  Result Value Ref Range Status   Specimen Description   Final    RIGHT ANTECUBITAL Performed at [facility] [place], [address] [place], [place] [address]    Special Requests   Final    BOTTLES DRAWN AEROBIC AND ANAEROBIC Blood Culture adequate volume Performed at [facility] [place], [address] [place], [place] [address]    Culture   Final    NO GROWTH 3 DAYS Performed at [facility], [address] [place], [place] [address]    Report  Status PENDING  Incomplete  SARS Coronavirus 2 by RT PCR (hospital order, performed in [facility] lab) Nasopharyngeal Nasopharyngeal Swab     Status: Abnormal   Collection Time: 09/29/19  6:58 PM   Specimen: Nasopharyngeal Swab  Result Value Ref Range Status   SARS Coronavirus 2 POSITIVE (A) NEGATIVE Final    Comment: RESULT CALLED TO, READ BACK BY AND VERIFIED WITH: KATIE CRANSTON RN @2020  09/29/2019 OLSONM (NOTE) SARS-CoV-2 target nucleic acids are DETECTED  SARS-CoV-2 RNA is generally detectable in upper respiratory specimens  during the acute phase of infection.  Positive results are indicative  of the presence of the identified virus, but do not rule out bacterial infection or co-infection with other pathogens not detected by the test.  Clinical correlation with patient history and  other diagnostic information is necessary to determine patient infection status.  The expected result is negative.  Fact Sheet for Patients:   BoilerBrush.com.cyhttps://www.fda.gov/media/136312/download   Fact Sheet for Healthcare Providers:   https://pope.com/https://www.fda.gov/media/136313/download    This test is not yet approved or cleared by the [place] FDA and  has been authorized for detection and/or diagnosis of SARS-CoV-2 by FDA under an Emergency Use Authorization (EUA).  This EUA will remain in effect (meaning  this test can be used) for the duration of  the COVID-19 declaration under Section 564(b)(1) of the Act, 21 U.S.C. section 360-bbb-3(b)(1), unless the authorization is terminated or revoked sooner.  Performed at [facility] [place], [address] [place], [place] [address]   Group A Strep by PCR     Status: None   Collection Time: 09/29/19  7:04 PM   Specimen: Throat; Sterile Swab  Result Value Ref Range Status   Group A Strep by PCR NOT DETECTED NOT DETECTED Final    Comment: Performed at [facility] [place], [address] [place], [place] [address]     Time coordinating  discharge:  40 minutes  SIGNED:   Dorcas CarrowKuber Dash Cardarelli, MD  [facility] 10/02/2019, 1:25 PM

## 2019-10-02 NOTE — Discharge Instructions (Signed)
Patient scheduled for outpatient Remdesivir infusion at 12pm, Thursday 9/2 at [facility]. Please inform the patient to park at [address] [place], [place], as staff will be escorting the patient through the east entrance of the hospital. The appointment will take approximately 45 minutes.    There is a wave flag banner located near the entrance on [address]. Turn into this entrance and immediately turn left and park in 1 of the 5 designated Covid Infusion Parking spots. There is a phone number on the sign, please call and let the staff know what spot you are in and we will come out and get you. For questions call [phone].  Thanks.

## 2019-10-02 NOTE — TOC Progression Note (Signed)
Transition of Care [facility]) - Progression Note    Patient Details  Name: Frank Robinson MRN: [id number] Date of Birth: Dec 25, 1980  Transition of Care [facility]) CM/SW Contact  Geni Bers, RN Phone Number: 10/02/2019, 11:17 AM  Clinical Narrative:    Pt from home with his wife. Will follow for [facility] needs.    Expected Discharge Plan: Home/Self Care    Expected Discharge Plan and Services Expected Discharge Plan: Home/Self Care       Living arrangements for the past 2 months: Single Family Home                                       Social Determinants of Health (SDOH) Interventions    Readmission Risk Interventions No flowsheet data found.

## 2019-10-02 NOTE — TOC Progression Note (Signed)
Transition of Care [facility]) - Progression Note    Patient Details  Name: Frank Robinson MRN: [id number] Date of Birth: 02/02/1980  Transition of Care [facility]) CM/SW Contact  Geni Bers, RN Phone Number: 10/02/2019, 1:46 PM  Clinical Narrative:    Referral for home O2 given to [employer] in house rep.    Expected Discharge Plan: Home/Self Care    Expected Discharge Plan and Services Expected Discharge Plan: Home/Self Care       Living arrangements for the past 2 months: Single Family Home Expected Discharge Date: 10/02/19                                     Social Determinants of Health (SDOH) Interventions    Readmission Risk Interventions No flowsheet data found.

## 2019-10-03 ENCOUNTER — Ambulatory Visit (HOSPITAL_COMMUNITY)
Admit: 2019-10-03 | Discharge: 2019-10-03 | Disposition: A | Discharge status: Home / Self Care | Payer: Medicaid Other | Attending: Pulmonary Disease | Admitting: Pulmonary Disease

## 2019-10-03 DIAGNOSIS — J1282 Pneumonia due to coronavirus disease 2019: Secondary | ICD-10-CM | POA: Diagnosis not present

## 2019-10-03 DIAGNOSIS — U071 COVID-19: Secondary | ICD-10-CM | POA: Diagnosis not present

## 2019-10-03 MED ORDER — EPINEPHRINE 0.3 MG/0.3ML IJ SOAJ: 0.3000 mg | Freq: Once | INTRAMUSCULAR | Status: DC | PRN

## 2019-10-03 MED ORDER — FAMOTIDINE IN NACL 20-0.9 MG/50ML-% IV SOLN: 20.0000 mg | Freq: Once | INTRAVENOUS | Status: DC | PRN

## 2019-10-03 MED ORDER — SODIUM CHLORIDE 0.9 % IV SOLN: INTRAVENOUS | Status: DC | PRN

## 2019-10-03 MED ORDER — DIPHENHYDRAMINE HCL 50 MG/ML IJ SOLN: 50.0000 mg | Freq: Once | INTRAMUSCULAR | Status: DC | PRN

## 2019-10-03 MED ORDER — METHYLPREDNISOLONE SODIUM SUCC 125 MG IJ SOLR: 125.0000 mg | Freq: Once | INTRAMUSCULAR | Status: DC | PRN

## 2019-10-03 MED ORDER — SODIUM CHLORIDE 0.9 % IV SOLN
100.0000 mg | Freq: Once | INTRAVENOUS | Status: AC
  Administered 2019-10-03: 100 mg via INTRAVENOUS
  Filled 2019-10-03: qty 20

## 2019-10-03 MED ORDER — ALBUTEROL SULFATE HFA 108 (90 BASE) MCG/ACT IN AERS: 2.0000 | INHALATION_SPRAY | Freq: Once | RESPIRATORY_TRACT | Status: DC | PRN

## 2019-10-03 NOTE — Discharge Instructions (Signed)
10 Things You Can Do to Manage Your COVID-19 Symptoms at Home If you have possible or confirmed COVID-19: 1. Stay home from work and school. And stay away from other public places. If you must go out, avoid using any kind of public transportation, ridesharing, or taxis. 2. Monitor your symptoms carefully. If your symptoms get worse, call your healthcare provider immediately. 3. Get rest and stay hydrated. 4. If you have a medical appointment, call the healthcare provider ahead of time and tell them that you have or may have COVID-19. 5. For medical emergencies, call 911 and notify the dispatch personnel that you have or may have COVID-19. 6. Cover your cough and sneezes with a tissue or use the inside of your elbow. 7. Wash your hands often with soap and water for at least 20 seconds or clean your hands with an alcohol-based hand sanitizer that contains at least 60% alcohol. 8. As much as possible, stay in a specific room and away from other people in your home. Also, you should use a separate bathroom, if available. If you need to be around other people in or outside of the home, wear a mask. 9. Avoid sharing personal items with other people in your household, like dishes, towels, and bedding. 10. Clean all surfaces that are touched often, like counters, tabletops, and doorknobs. Use household cleaning sprays or wipes according to the label instructions. [employer].co.uk 08/01/2018 This information is not intended to replace advice given to you by your health care provider. Make sure you discuss any questions you have with your health care provider. Document Revised: 01/03/2019 Document Reviewed: 01/03/2019 Elsevier Patient Education  2020 [employer].  What types of side effects do monoclonal antibody drugs cause?  Common side effects  In general, the more common side effects caused by monoclonal antibody drugs include: . Allergic reactions, such as hives or itching . Flu-like signs and  symptoms, including chills, fatigue, fever, and muscle aches and pains . Nausea, vomiting . Diarrhea . Skin rashes . Low blood pressure   The CDC is recommending patients who receive monoclonal antibody treatments wait at least 90 days before being vaccinated.  Currently, there are no data on the safety and efficacy of mRNA COVID-19 vaccines in persons who received monoclonal antibodies or convalescent plasma as part of COVID-19 treatment. Based on the estimated half-life of such therapies as well as evidence suggesting that reinfection is uncommon in the 90 days after initial infection, vaccination should be deferred for at least 90 days, as a precautionary measure until additional information becomes available, to avoid interference of the antibody treatment with vaccine-induced immune responses.10

## 2019-10-03 NOTE — Progress Notes (Signed)
  Diagnosis: COVID-19  Physician: Dr. Shan Levans  Procedure: Covid Infusion Clinic Med: remdesivir infusion - Provided patient with remdesivir fact sheet for patients, parents and caregivers prior to infusion.  Complications: No immediate complications noted.  Discharge: Discharged home   Cherlynn Perches 10/03/2019

## 2019-10-04 LAB — CULTURE, BLOOD (ROUTINE X 2)
Culture: NO GROWTH
Culture: NO GROWTH
Special Requests: ADEQUATE
Special Requests: ADEQUATE

## 2020-07-20 ENCOUNTER — Other Ambulatory Visit: Payer: Self-pay

## 2020-07-20 ENCOUNTER — Emergency Department (HOSPITAL_BASED_OUTPATIENT_CLINIC_OR_DEPARTMENT_OTHER): Payer: Medicaid Other

## 2020-07-20 ENCOUNTER — Emergency Department (HOSPITAL_BASED_OUTPATIENT_CLINIC_OR_DEPARTMENT_OTHER)
Admission: EM | Admit: 2020-07-20 | Discharge: 2020-07-20 | Disposition: A | Discharge status: Home / Self Care | Payer: Medicaid Other | Attending: Emergency Medicine | Admitting: Emergency Medicine

## 2020-07-20 ENCOUNTER — Encounter (HOSPITAL_BASED_OUTPATIENT_CLINIC_OR_DEPARTMENT_OTHER): Payer: Self-pay | Admitting: Emergency Medicine

## 2020-07-20 DIAGNOSIS — R12 Heartburn: Secondary | ICD-10-CM | POA: Diagnosis not present

## 2020-07-20 DIAGNOSIS — R140 Abdominal distension (gaseous): Secondary | ICD-10-CM | POA: Diagnosis not present

## 2020-07-20 DIAGNOSIS — R112 Nausea with vomiting, unspecified: Secondary | ICD-10-CM | POA: Insufficient documentation

## 2020-07-20 DIAGNOSIS — F419 Anxiety disorder, unspecified: Secondary | ICD-10-CM | POA: Diagnosis not present

## 2020-07-20 DIAGNOSIS — Z8616 Personal history of COVID-19: Secondary | ICD-10-CM | POA: Diagnosis not present

## 2020-07-20 DIAGNOSIS — J45909 Unspecified asthma, uncomplicated: Secondary | ICD-10-CM | POA: Diagnosis not present

## 2020-07-20 DIAGNOSIS — Z91040 Latex allergy status: Secondary | ICD-10-CM | POA: Insufficient documentation

## 2020-07-20 DIAGNOSIS — R072 Precordial pain: Secondary | ICD-10-CM | POA: Insufficient documentation

## 2020-07-20 DIAGNOSIS — F17210 Nicotine dependence, cigarettes, uncomplicated: Secondary | ICD-10-CM | POA: Insufficient documentation

## 2020-07-20 DIAGNOSIS — R079 Chest pain, unspecified: Secondary | ICD-10-CM

## 2020-07-20 LAB — TROPONIN I (HIGH SENSITIVITY)
Troponin I (High Sensitivity): 4 ng/L (ref <18)
Troponin I (High Sensitivity): 4 ng/L (ref <18)

## 2020-07-20 LAB — CBC
HCT: 45.9 % (ref 39.0–52.0)
Hemoglobin: 14.4 g/dL (ref 13.0–17.0)
MCH: 27.8 pg (ref 26.0–34.0)
MCHC: 31.4 g/dL (ref 30.0–36.0)
MCV: 88.6 fL (ref 80.0–100.0)
Platelets: 226 10*3/uL (ref 150–400)
RBC: 5.18 MIL/uL (ref 4.22–5.81)
RDW: 15.9 % — ABNORMAL HIGH (ref 11.5–15.5)
WBC: 7.3 10*3/uL (ref 4.0–10.5)
nRBC: 0 % (ref 0.0–0.2)

## 2020-07-20 LAB — COMPREHENSIVE METABOLIC PANEL
ALT: 24 U/L (ref 0–44)
AST: 20 U/L (ref 15–41)
Albumin: 3.5 g/dL (ref 3.5–5.0)
Alkaline Phosphatase: 61 U/L (ref 38–126)
Anion gap: 6 (ref 5–15)
BUN: 10 mg/dL (ref 6–20)
CO2: 29 mmol/L (ref 22–32)
Calcium: 8.3 mg/dL — ABNORMAL LOW (ref 8.9–10.3)
Chloride: 108 mmol/L (ref 98–111)
Creatinine, Ser: 0.91 mg/dL (ref 0.61–1.24)
GFR, Estimated: >60 mL/min (ref >60)
Glucose, Bld: 126 mg/dL — ABNORMAL HIGH (ref 70–99)
Potassium: 3.8 mmol/L (ref 3.5–5.1)
Sodium: 143 mmol/L (ref 135–145)
Total Bilirubin: 0.5 mg/dL (ref 0.3–1.2)
Total Protein: 6.5 g/dL (ref 6.5–8.1)

## 2020-07-20 LAB — LIPASE, BLOOD: Lipase: 30 U/L (ref 11–51)

## 2020-07-20 MED ORDER — ALUM & MAG HYDROXIDE-SIMETH 200-200-20 MG/5ML PO SUSP
30.0000 mL | Freq: Once | ORAL | Status: AC
  Administered 2020-07-20: 30 mL via ORAL
  Filled 2020-07-20: qty 30

## 2020-07-20 MED ORDER — LIDOCAINE VISCOUS HCL 2 % MT SOLN
15.0000 mL | Freq: Once | OROMUCOSAL | Status: AC
  Administered 2020-07-20: 15 mL via ORAL
  Filled 2020-07-20: qty 15

## 2020-07-20 NOTE — ED Triage Notes (Signed)
Reports abdominal bloating that started around 5-6 pm.  Thought it was his normal acid reflux.  Took otc famotidine with no relief.  Now reports pain is going across his chest.  Endorses feeling SOB and pain radiating into the center of his back.

## 2020-07-20 NOTE — ED Provider Notes (Signed)
[facility] EMERGENCY DEPARTMENT Provider Note  CSN: [id number] Arrival date & time: 07/20/20 0115  Chief Complaint(s) Chest Pain and Bloated  HPI Frank Robinson is a 40 y.o. male   The history is provided by the patient.  Chest Pain Pain location:  Substernal area and L chest Pain quality: pressure   Pain quality comment:  Bloating Pain radiates to:  Upper back Pain severity:  Moderate Onset quality:  Gradual Duration:  3 hours Timing:  Constant Progression:  Partially resolved Chronicity:  Recurrent Relieved by:  Nothing Exacerbated by: emesis and lying down. Associated symptoms: anxiety, heartburn, nausea and vomiting   Associated symptoms: no cough   Risk factors: high cholesterol, hypertension, male sex and smoking    Report abd bloating that has improved.  Past Medical History Past Medical History:  Diagnosis Date   Asthma    Back pain    DDD (degenerative disc disease), lumbar    DDD (degenerative disc disease), lumbar    Difficult intubation    GERD (gastroesophageal reflux disease)    High cholesterol    Kidney calculi    Kidney stones    Patient Active Problem List   Diagnosis Date Noted   Asthma 09/30/2019   DDD (degenerative disc disease), lumbar 09/30/2019   GERD (gastroesophageal reflux disease) 09/30/2019   Hyperlipidemia 09/30/2019   Acute hypoxemic respiratory failure due to COVID-19 [facility]) 09/29/2019   Home Medication(s) Prior to Admission medications   Medication Sig Start Date End Date Taking? Authorizing Provider  acetaminophen (TYLENOL) 500 MG tablet Take 500 mg by mouth every 6 (six) hours as needed for moderate pain or headache.    [provider]  Cimetidine (HEARTBURN RELIEF PO) Take 1 tablet by mouth daily as needed (heartburn).    [provider]                                                                                                                                    Past Surgical History Past  Surgical History:  Procedure Laterality Date   CYSTOSCOPY W/ URETERAL STENT PLACEMENT  12/18/2010   Procedure: CYSTOSCOPY WITH RETROGRADE PYELOGRAM/URETERAL STENT PLACEMENT;  Surgeon: Lindaann Slough, MD;  Location: [facility];  Service: Urology;  Laterality: Left;  left ureteroscopy   LITHOTRIPSY     Family History No family history on file.  Social History Social History   Tobacco Use   Smoking status: Every Day    Packs/day: 1.00    Pack years: 0.00    Types: Cigarettes   Smokeless tobacco: Never  Vaping Use   Vaping Use: Some days  Substance Use Topics   Alcohol use: Yes    Comment: occasionally   Drug use: Yes   Allergies Latex  Review of Systems Review of Systems  Respiratory:  Negative for cough.   Cardiovascular:  Positive for chest pain.  Gastrointestinal:  Positive for heartburn, nausea and vomiting.  All other  systems are reviewed and are negative for acute change except as noted in the HPI  Physical Exam Vital Signs  I have reviewed the triage vital signs BP (!) 145/94 (BP Location: Right Arm)   Pulse 87   Temp 97.8 F (36.6 C) (Oral)   Resp 12   Ht 6' (1.829 m)   Wt (!) 138.8 kg   SpO2 96%   BMI 41.50 kg/m   Physical Exam Vitals reviewed.  Constitutional:      General: He is not in acute distress.    Appearance: He is well-developed. He is not diaphoretic.  HENT:     Head: Normocephalic and atraumatic.     Nose: Nose normal.  Eyes:     General: No scleral icterus.       Right eye: No discharge.        Left eye: No discharge.     Conjunctiva/sclera: Conjunctivae normal.     Pupils: Pupils are equal, round, and reactive to light.  Cardiovascular:     Rate and Rhythm: Normal rate and regular rhythm.     Heart sounds: No murmur heard.   No friction rub. No gallop.  Pulmonary:     Effort: Pulmonary effort is normal. No respiratory distress.     Breath sounds: Normal breath sounds. No stridor. No rales.  Abdominal:     General: There is no  distension.     Palpations: Abdomen is soft.     Tenderness: There is no abdominal tenderness.  Musculoskeletal:        General: No tenderness.     Cervical back: Normal range of motion and neck supple.  Skin:    General: Skin is warm and dry.     Findings: No erythema or rash.  Neurological:     Mental Status: He is alert and oriented to person, place, and time.    ED Results and Treatments Labs (all labs ordered are listed, but only abnormal results are displayed) Labs Reviewed  CBC - Abnormal; Notable for the following components:      Result Value   RDW 15.9 (*)    All other components within normal limits  COMPREHENSIVE METABOLIC PANEL - Abnormal; Notable for the following components:   Glucose, Bld 126 (*)    Calcium 8.3 (*)    All other components within normal limits  LIPASE, BLOOD  TROPONIN I (HIGH SENSITIVITY)  TROPONIN I (HIGH SENSITIVITY)                                                                                                                         EKG  EKG Interpretation  Date/Time:  Monday July 20 2020 01:23:56 EDT Ventricular Rate:  86 PR Interval:  162 QRS Duration: 89 QT Interval:  363 QTC Calculation: 435 R Axis:   80 Text Interpretation: Sinus rhythm No acute changes Confirmed by Drema Pry [phone]) on 07/20/2020 1:53:53 AM        Radiology DG Chest [facility]  1 View  Result Date: 07/20/2020 CLINICAL DATA:  Acute onset of chest pressure and pain for 3 hours. EXAM: PORTABLE CHEST 1 VIEW COMPARISON:  09/29/2019 FINDINGS: The heart size and mediastinal contours are within normal limits. Both lungs are clear. The visualized skeletal structures are unremarkable. IMPRESSION: No active disease. Electronically Signed   By: Signa Kell M.D.   On: 07/20/2020 02:39    Pertinent labs & imaging results that were available during my care of the patient were reviewed by me and considered in my medical decision making (see chart for details).  Medications  Ordered in ED Medications  alum & mag hydroxide-simeth (MAALOX/MYLANTA) 200-200-20 MG/5ML suspension 30 mL (30 mLs Oral Given 07/20/20 0205)    And  lidocaine (XYLOCAINE) 2 % viscous mouth solution 15 mL (15 mLs Oral Given 07/20/20 0205)                                                                                                                                    Procedures Procedures  (including critical care time)  Medical Decision Making / ED Course I have reviewed the nursing notes for this encounter and the patient's prior records (if available in EHR or on provided paperwork).   Frank Robinson was evaluated in Emergency Department on 07/20/2020 for the symptoms described in the history of present illness. He was evaluated in the context of the global COVID-19 pandemic, which necessitated consideration that the patient might be at risk for infection with the SARS-CoV-2 virus that causes COVID-19. Institutional protocols and algorithms that pertain to the evaluation of patients at risk for COVID-19 are in a state of rapid change based on information released by regulatory bodies including the CDC and federal and state organizations. These policies and algorithms were followed during the patient's care in the ED.  Atypical chest pain. EKG without acute ischemic changes or evidence of pericarditis. Serial troponins negative x2. Heart score less than 4 Ruled out for ACS. Low suspicion for pulmonary embolism. Presentation not classic for aortic dissection or esophageal perforation. Chest x-ray without evidence suggestive of pneumonia, pneumothorax, pneumomediastinum.  No abnormal contour of the mediastinum to suggest dissection. No evidence of acute injuries.  Patient provided with GI cocktail which significantly improved her symptoms      Final Clinical Impression(s) / ED Diagnoses Final diagnoses:  Chest pain    The patient appears reasonably screened and/or stabilized for  discharge and I doubt any other medical condition or other [facility] requiring further screening, evaluation, or treatment in the ED at this time prior to discharge. Safe for discharge with strict return precautions.  Disposition: Discharge  Condition: Good  I have discussed the results, Dx and Tx plan with the patient/family who expressed understanding and agree(s) with the plan. Discharge instructions discussed at length. The patient/family was given strict return precautions who verbalized understanding of the instructions. No further questions at time of discharge.  ED Discharge Orders     None       Follow Up: Maye HidesMiller, Ryan Dean, [place] [address] [place] [id number] [phone]  Call  to schedule an appointment for close follow up     This chart was dictated using voice recognition software.  Despite best efforts to proofread,  errors can occur which can change the documentation meaning.    Nira Connardama, Greyson Peavy Eduardo, MD 07/20/20 [phone]

## 2020-07-31 ENCOUNTER — Other Ambulatory Visit: Payer: Self-pay

## 2020-07-31 ENCOUNTER — Emergency Department (HOSPITAL_BASED_OUTPATIENT_CLINIC_OR_DEPARTMENT_OTHER)
Admission: EM | Admit: 2020-07-31 | Discharge: 2020-07-31 | Disposition: A | Discharge status: Home / Self Care | Payer: Medicaid Other | Attending: Emergency Medicine | Admitting: Emergency Medicine

## 2020-07-31 ENCOUNTER — Encounter (HOSPITAL_BASED_OUTPATIENT_CLINIC_OR_DEPARTMENT_OTHER): Payer: Self-pay | Admitting: Emergency Medicine

## 2020-07-31 DIAGNOSIS — Z91040 Latex allergy status: Secondary | ICD-10-CM | POA: Diagnosis not present

## 2020-07-31 DIAGNOSIS — J45909 Unspecified asthma, uncomplicated: Secondary | ICD-10-CM | POA: Insufficient documentation

## 2020-07-31 DIAGNOSIS — Z8616 Personal history of COVID-19: Secondary | ICD-10-CM | POA: Diagnosis not present

## 2020-07-31 DIAGNOSIS — R519 Headache, unspecified: Secondary | ICD-10-CM

## 2020-07-31 DIAGNOSIS — F17210 Nicotine dependence, cigarettes, uncomplicated: Secondary | ICD-10-CM | POA: Diagnosis not present

## 2020-07-31 HISTORY — DX: Obstructive sleep apnea (adult) (pediatric): G47.33

## 2020-07-31 MED ORDER — METOCLOPRAMIDE HCL 5 MG/ML IJ SOLN
10.0000 mg | Freq: Once | INTRAMUSCULAR | Status: AC
  Administered 2020-07-31: 10 mg via INTRAVENOUS
  Filled 2020-07-31: qty 2

## 2020-07-31 MED ORDER — SODIUM CHLORIDE 0.9 % IV BOLUS
1000.0000 mL | Freq: Once | INTRAVENOUS | Status: AC
  Administered 2020-07-31: 1000 mL via INTRAVENOUS

## 2020-07-31 MED ORDER — DIPHENHYDRAMINE HCL 50 MG/ML IJ SOLN
25.0000 mg | Freq: Once | INTRAMUSCULAR | Status: AC
  Administered 2020-07-31: 25 mg via INTRAVENOUS
  Filled 2020-07-31: qty 1

## 2020-07-31 MED ORDER — KETOROLAC TROMETHAMINE 30 MG/ML IJ SOLN
30.0000 mg | Freq: Once | INTRAMUSCULAR | Status: AC
  Administered 2020-07-31: 30 mg via INTRAVENOUS
  Filled 2020-07-31: qty 1

## 2020-07-31 MED ORDER — DEXAMETHASONE SODIUM PHOSPHATE 10 MG/ML IJ SOLN
10.0000 mg | Freq: Once | INTRAMUSCULAR | Status: AC
  Administered 2020-07-31: 10 mg via INTRAVENOUS
  Filled 2020-07-31: qty 1

## 2020-07-31 NOTE — ED Notes (Signed)
ED Provider at bedside. 

## 2020-07-31 NOTE — Discharge Instructions (Addendum)
Continue medications as previously prescribed.  Return to the emergency department if symptoms significantly worsen or change. 

## 2020-07-31 NOTE — ED Notes (Signed)
Pt given ginger ale.

## 2020-07-31 NOTE — ED Triage Notes (Signed)
Pt c/o migraine headache x 2 days with nausea. Pt states he can't afford migraine medication prescribed by his doctor.

## 2020-07-31 NOTE — ED Notes (Signed)
RT to bedside and pt placed on oxygen 4L via [place].

## 2020-07-31 NOTE — ED Provider Notes (Signed)
[facility] EMERGENCY DEPARTMENT Provider Note   CSN: [id number] Arrival date & time: 07/31/20  0326     History Chief Complaint  Patient presents with   Migraine    Frank Robinson is a 40 y.o. male.  Patient is a 40 year old male with past medical history of obstructive sleep apnea, GERD, obesity, hyperlipidemia, and COVID-19 virus infection last year that caused respiratory failure.  Patient presenting today with complaints of headache.  This has been present for the past 3 days.  He describes it as a "migraine".  He has had headaches in the past and this feels similar, but worse and has not improved with home medication.  He denies any visual disturbances.  He denies any fevers, chills, or stiff neck.  The history is provided by the patient.  Migraine This is a recurrent problem. Episode onset: 3 days ago. The problem occurs constantly. The problem has been gradually improving. Nothing aggravates the symptoms. Nothing relieves the symptoms. He has tried acetaminophen for the symptoms.      Past Medical History:  Diagnosis Date   Asthma    Back pain    DDD (degenerative disc disease), lumbar    DDD (degenerative disc disease), lumbar    Difficult intubation    GERD (gastroesophageal reflux disease)    High cholesterol    Kidney calculi    Kidney stones    OSA (obstructive sleep apnea)     Patient Active Problem List   Diagnosis Date Noted   Asthma 09/30/2019   DDD (degenerative disc disease), lumbar 09/30/2019   GERD (gastroesophageal reflux disease) 09/30/2019   Hyperlipidemia 09/30/2019   Acute hypoxemic respiratory failure due to COVID-19 (HCC) 09/29/2019    Past Surgical History:  Procedure Laterality Date   CYSTOSCOPY W/ URETERAL STENT PLACEMENT  12/18/2010   Procedure: CYSTOSCOPY WITH RETROGRADE PYELOGRAM/URETERAL STENT PLACEMENT;  Surgeon: Lindaann Slough, MD;  Location: [facility];  Service: Urology;  Laterality: Left;  left ureteroscopy    LITHOTRIPSY         History reviewed. No pertinent family history.  Social History   Tobacco Use   Smoking status: Every Day    Packs/day: 1.00    Pack years: 0.00    Types: Cigarettes   Smokeless tobacco: Never  Vaping Use   Vaping Use: Some days  Substance Use Topics   Alcohol use: Yes    Comment: occasionally   Drug use: Yes    Home Medications Prior to Admission medications   Medication Sig Start Date End Date Taking? Authorizing Provider  acetaminophen (TYLENOL) 500 MG tablet Take 500 mg by mouth every 6 (six) hours as needed for moderate pain or headache.    [provider]  Cimetidine (HEARTBURN RELIEF PO) Take 1 tablet by mouth daily as needed (heartburn).    [provider]    Allergies    Latex  Review of Systems   Review of Systems  All other systems reviewed and are negative.  Physical Exam Updated Vital Signs BP 137/80   Pulse (!) 104   Temp 97.9 F (36.6 C) (Oral)   Resp 16   Ht 6' (1.829 m)   Wt (!) 137.9 kg   SpO2 (!) 84%   BMI 41.23 kg/m   Physical Exam Vitals and nursing note reviewed.  Constitutional:      General: He is not in acute distress.    Appearance: He is well-developed. He is not diaphoretic.  HENT:     Head: Normocephalic and  atraumatic.  Cardiovascular:     Rate and Rhythm: Normal rate and regular rhythm.     Heart sounds: No murmur heard.   No friction rub.  Pulmonary:     Effort: Pulmonary effort is normal. No respiratory distress.     Breath sounds: Normal breath sounds. No wheezing or rales.  Abdominal:     General: Bowel sounds are normal. There is no distension.     Palpations: Abdomen is soft.     Tenderness: There is no abdominal tenderness.  Musculoskeletal:        General: Normal range of motion.     Cervical back: Normal range of motion and neck supple.  Skin:    General: Skin is warm and dry.  Neurological:     General: No focal deficit present.     Mental Status: He is alert and  oriented to person, place, and time.     Cranial Nerves: No cranial nerve deficit.     Motor: No weakness.     Coordination: Coordination normal.    ED Results / Procedures / Treatments   Labs (all labs ordered are listed, but only abnormal results are displayed) Labs Reviewed - No data to display  EKG None  Radiology No results found.  Procedures Procedures   Medications Ordered in ED Medications  sodium chloride 0.9 % bolus 1,000 mL (has no administration in time range)  ketorolac (TORADOL) 30 MG/ML injection 30 mg (has no administration in time range)  diphenhydrAMINE (BENADRYL) injection 25 mg (has no administration in time range)  dexamethasone (DECADRON) injection 10 mg (has no administration in time range)  metoCLOPramide (REGLAN) injection 10 mg (has no administration in time range)    ED Course  I have reviewed the triage vital signs and the nursing notes.  Pertinent labs & imaging results that were available during my care of the patient were reviewed by me and considered in my medical decision making (see chart for details).    MDM Rules/Calculators/A&P  Patient presenting with complaints of headache consistent with prior migraines.  He is neurologically intact with stable vital signs.  Symptoms have significantly improved with migraine cocktail and IV fluids.  Discharge seems appropriate.  Final Clinical Impression(s) / ED Diagnoses Final diagnoses:  None    Rx / DC Orders ED Discharge Orders     None        Geoffery Lyons, MD 07/31/20 [phone]

## 2020-07-31 NOTE — ED Notes (Signed)
Pt's oxygen level 79% on RA. Pt states he has been on oxygen intermittently since having Covid x 8 months ago. Pt states he is new dx of OSA and is awaiting c pap machine.

## 2020-07-31 NOTE — Progress Notes (Signed)
Placed patient on 4 liter nasal cannula per his home regimen.  Patient's SPO2 on RA was 74%.  SPO2 increased to 94% on 4 liter nasal cannula.  Patient states that he has an E cylinder in his care he can use to get home.  RT will continue to monitor.

## 2020-08-05 ENCOUNTER — Emergency Department (HOSPITAL_BASED_OUTPATIENT_CLINIC_OR_DEPARTMENT_OTHER)
Admission: EM | Admit: 2020-08-05 | Discharge: 2020-08-05 | Disposition: A | Discharge status: Home / Self Care | Payer: Medicaid Other | Source: Home / Self Care | Attending: Emergency Medicine | Admitting: Emergency Medicine

## 2020-08-05 ENCOUNTER — Inpatient Hospital Stay (HOSPITAL_BASED_OUTPATIENT_CLINIC_OR_DEPARTMENT_OTHER)
Admission: EM | Admit: 2020-08-05 | Discharge: 2020-08-10 | DRG: 176 | Disposition: A | Discharge status: Home / Self Care | Payer: Medicaid Other | Attending: Internal Medicine | Admitting: Internal Medicine

## 2020-08-05 ENCOUNTER — Encounter (HOSPITAL_BASED_OUTPATIENT_CLINIC_OR_DEPARTMENT_OTHER): Payer: Self-pay | Admitting: Emergency Medicine

## 2020-08-05 ENCOUNTER — Other Ambulatory Visit: Payer: Self-pay

## 2020-08-05 ENCOUNTER — Emergency Department (HOSPITAL_BASED_OUTPATIENT_CLINIC_OR_DEPARTMENT_OTHER): Payer: Medicaid Other

## 2020-08-05 ENCOUNTER — Ambulatory Visit (HOSPITAL_BASED_OUTPATIENT_CLINIC_OR_DEPARTMENT_OTHER)
Admission: RE | Admit: 2020-08-05 | Discharge: 2020-08-05 | Disposition: A | Discharge status: Home / Self Care | Payer: Medicaid Other | Source: Ambulatory Visit | Attending: Emergency Medicine | Admitting: Emergency Medicine

## 2020-08-05 DIAGNOSIS — I82412 Acute embolism and thrombosis of left femoral vein: Secondary | ICD-10-CM | POA: Insufficient documentation

## 2020-08-05 DIAGNOSIS — G4733 Obstructive sleep apnea (adult) (pediatric): Secondary | ICD-10-CM | POA: Diagnosis present

## 2020-08-05 DIAGNOSIS — Z720 Tobacco use: Secondary | ICD-10-CM | POA: Diagnosis not present

## 2020-08-05 DIAGNOSIS — I2609 Other pulmonary embolism with acute cor pulmonale: Secondary | ICD-10-CM | POA: Diagnosis not present

## 2020-08-05 DIAGNOSIS — M25552 Pain in left hip: Secondary | ICD-10-CM | POA: Diagnosis not present

## 2020-08-05 DIAGNOSIS — I2699 Other pulmonary embolism without acute cor pulmonale: Secondary | ICD-10-CM | POA: Diagnosis present

## 2020-08-05 DIAGNOSIS — F17210 Nicotine dependence, cigarettes, uncomplicated: Secondary | ICD-10-CM | POA: Insufficient documentation

## 2020-08-05 DIAGNOSIS — R911 Solitary pulmonary nodule: Secondary | ICD-10-CM | POA: Diagnosis present

## 2020-08-05 DIAGNOSIS — M79605 Pain in left leg: Secondary | ICD-10-CM

## 2020-08-05 DIAGNOSIS — R0602 Shortness of breath: Secondary | ICD-10-CM | POA: Diagnosis not present

## 2020-08-05 DIAGNOSIS — K219 Gastro-esophageal reflux disease without esophagitis: Secondary | ICD-10-CM | POA: Diagnosis present

## 2020-08-05 DIAGNOSIS — M5136 Other intervertebral disc degeneration, lumbar region: Secondary | ICD-10-CM | POA: Diagnosis present

## 2020-08-05 DIAGNOSIS — Z79899 Other long term (current) drug therapy: Secondary | ICD-10-CM | POA: Diagnosis not present

## 2020-08-05 DIAGNOSIS — Z7901 Long term (current) use of anticoagulants: Secondary | ICD-10-CM | POA: Diagnosis not present

## 2020-08-05 DIAGNOSIS — Z91040 Latex allergy status: Secondary | ICD-10-CM | POA: Insufficient documentation

## 2020-08-05 DIAGNOSIS — J45909 Unspecified asthma, uncomplicated: Secondary | ICD-10-CM | POA: Diagnosis present

## 2020-08-05 DIAGNOSIS — Z20822 Contact with and (suspected) exposure to covid-19: Secondary | ICD-10-CM | POA: Diagnosis present

## 2020-08-05 DIAGNOSIS — E785 Hyperlipidemia, unspecified: Secondary | ICD-10-CM | POA: Diagnosis present

## 2020-08-05 DIAGNOSIS — Z8616 Personal history of COVID-19: Secondary | ICD-10-CM

## 2020-08-05 DIAGNOSIS — I82402 Acute embolism and thrombosis of unspecified deep veins of left lower extremity: Secondary | ICD-10-CM

## 2020-08-05 DIAGNOSIS — I2692 Saddle embolus of pulmonary artery without acute cor pulmonale: Principal | ICD-10-CM | POA: Diagnosis present

## 2020-08-05 DIAGNOSIS — K649 Unspecified hemorrhoids: Secondary | ICD-10-CM | POA: Diagnosis present

## 2020-08-05 DIAGNOSIS — M79662 Pain in left lower leg: Secondary | ICD-10-CM | POA: Insufficient documentation

## 2020-08-05 DIAGNOSIS — I82452 Acute embolism and thrombosis of left peroneal vein: Secondary | ICD-10-CM | POA: Diagnosis present

## 2020-08-05 DIAGNOSIS — E7800 Pure hypercholesterolemia, unspecified: Secondary | ICD-10-CM | POA: Diagnosis present

## 2020-08-05 DIAGNOSIS — F419 Anxiety disorder, unspecified: Secondary | ICD-10-CM | POA: Diagnosis present

## 2020-08-05 DIAGNOSIS — Z6841 Body Mass Index (BMI) 40.0 and over, adult: Secondary | ICD-10-CM | POA: Diagnosis not present

## 2020-08-05 DIAGNOSIS — E669 Obesity, unspecified: Secondary | ICD-10-CM | POA: Diagnosis present

## 2020-08-05 DIAGNOSIS — Z7951 Long term (current) use of inhaled steroids: Secondary | ICD-10-CM | POA: Diagnosis not present

## 2020-08-05 DIAGNOSIS — R079 Chest pain, unspecified: Secondary | ICD-10-CM | POA: Diagnosis present

## 2020-08-05 HISTORY — DX: Anxiety disorder, unspecified: F41.9

## 2020-08-05 LAB — CBC WITH DIFFERENTIAL/PLATELET
Abs Immature Granulocytes: 0.02 10*3/uL (ref 0.00–0.07)
Basophils Absolute: 0.1 10*3/uL (ref 0.0–0.1)
Basophils Relative: 1 %
Eosinophils Absolute: 0.4 10*3/uL (ref 0.0–0.5)
Eosinophils Relative: 4 %
HCT: 48.8 % (ref 39.0–52.0)
Hemoglobin: 15.7 g/dL (ref 13.0–17.0)
Immature Granulocytes: 0 %
Lymphocytes Relative: 31 %
Lymphs Abs: 3.2 10*3/uL (ref 0.7–4.0)
MCH: 27.5 pg (ref 26.0–34.0)
MCHC: 32.2 g/dL (ref 30.0–36.0)
MCV: 85.6 fL (ref 80.0–100.0)
Monocytes Absolute: 0.7 10*3/uL (ref 0.1–1.0)
Monocytes Relative: 6 %
Neutro Abs: 6 10*3/uL (ref 1.7–7.7)
Neutrophils Relative %: 58 %
Platelets: 166 10*3/uL (ref 150–400)
RBC: 5.7 MIL/uL (ref 4.22–5.81)
RDW: 15.9 % — ABNORMAL HIGH (ref 11.5–15.5)
WBC: 10.3 10*3/uL (ref 4.0–10.5)
nRBC: 0 % (ref 0.0–0.2)

## 2020-08-05 LAB — RESP PANEL BY RT-PCR (FLU A&B, COVID) ARPGX2
Influenza A by PCR: NEGATIVE
Influenza B by PCR: NEGATIVE
SARS Coronavirus 2 by RT PCR: NEGATIVE

## 2020-08-05 LAB — BASIC METABOLIC PANEL
Anion gap: 8 (ref 5–15)
BUN: 15 mg/dL (ref 6–20)
CO2: 29 mmol/L (ref 22–32)
Calcium: 9 mg/dL (ref 8.9–10.3)
Chloride: 98 mmol/L (ref 98–111)
Creatinine, Ser: 1.03 mg/dL (ref 0.61–1.24)
GFR, Estimated: >60 mL/min (ref >60)
Glucose, Bld: 94 mg/dL (ref 70–99)
Potassium: 3.8 mmol/L (ref 3.5–5.1)
Sodium: 135 mmol/L (ref 135–145)

## 2020-08-05 LAB — MRSA NEXT GEN BY PCR, NASAL: MRSA by PCR Next Gen: NOT DETECTED

## 2020-08-05 LAB — PROTIME-INR
INR: 1 (ref 0.8–1.2)
Prothrombin Time: 13.6 seconds (ref 11.4–15.2)

## 2020-08-05 LAB — TROPONIN I (HIGH SENSITIVITY)
Troponin I (High Sensitivity): 16 ng/L (ref <18)
Troponin I (High Sensitivity): 13 ng/L (ref <18)

## 2020-08-05 LAB — HEPARIN LEVEL (UNFRACTIONATED): Heparin Unfractionated: 0.18 IU/mL — ABNORMAL LOW (ref 0.30–0.70)

## 2020-08-05 LAB — BRAIN NATRIURETIC PEPTIDE: B Natriuretic Peptide: 29.5 pg/mL (ref 0.0–100.0)

## 2020-08-05 MED ORDER — DICLOFENAC SODIUM 1 % EX GEL: 4.0000 g | Freq: Four times a day (QID) | CUTANEOUS | 0 refills | Status: AC

## 2020-08-05 MED ORDER — LIDOCAINE VISCOUS HCL 2 % MT SOLN
15.0000 mL | OROMUCOSAL | Status: DC | PRN
  Filled 2020-08-05: qty 15

## 2020-08-05 MED ORDER — OXYCODONE HCL 5 MG PO TABS
5.0000 mg | ORAL_TABLET | ORAL | Status: DC | PRN
  Administered 2020-08-05 – 2020-08-10 (×15): 5 mg via ORAL
  Filled 2020-08-05 (×15): qty 1

## 2020-08-05 MED ORDER — HEPARIN BOLUS VIA INFUSION
3000.0000 [IU] | Freq: Once | INTRAVENOUS | Status: AC
  Administered 2020-08-05: 3000 [IU] via INTRAVENOUS
  Filled 2020-08-05: qty 3000

## 2020-08-05 MED ORDER — HEPARIN (PORCINE) 25000 UT/250ML-% IV SOLN
1800.0000 [IU]/h | INTRAVENOUS | Status: DC
  Administered 2020-08-05: 1800 [IU]/h via INTRAVENOUS
  Filled 2020-08-05: qty 250

## 2020-08-05 MED ORDER — HEPARIN (PORCINE) 25000 UT/250ML-% IV SOLN
2150.0000 [IU]/h | INTRAVENOUS | Status: DC
  Filled 2020-08-05: qty 250

## 2020-08-05 MED ORDER — HYDRALAZINE HCL 20 MG/ML IJ SOLN
5.0000 mg | INTRAMUSCULAR | Status: DC | PRN
Start: 2020-08-05 — End: 2020-08-10

## 2020-08-05 MED ORDER — SODIUM CHLORIDE 0.9% FLUSH
3.0000 mL | Freq: Two times a day (BID) | INTRAVENOUS | Status: DC
  Administered 2020-08-05 – 2020-08-10 (×5): 3 mL via INTRAVENOUS

## 2020-08-05 MED ORDER — ONDANSETRON HCL 4 MG PO TABS: 4.0000 mg | ORAL_TABLET | Freq: Four times a day (QID) | ORAL | Status: DC | PRN

## 2020-08-05 MED ORDER — NAPROXEN 250 MG PO TABS
500.0000 mg | ORAL_TABLET | ORAL | Status: AC
  Administered 2020-08-05: 500 mg via ORAL
  Filled 2020-08-05: qty 2

## 2020-08-05 MED ORDER — ALUM & MAG HYDROXIDE-SIMETH 200-200-20 MG/5ML PO SUSP: 30.0000 mL | ORAL | Status: DC | PRN

## 2020-08-05 MED ORDER — MAGNESIUM CITRATE PO SOLN: 1.0000 | Freq: Once | ORAL | Status: DC | PRN

## 2020-08-05 MED ORDER — CHLORHEXIDINE GLUCONATE CLOTH 2 % EX PADS
6.0000 | MEDICATED_PAD | Freq: Every day | CUTANEOUS | Status: DC
  Administered 2020-08-05 – 2020-08-09 (×4): 6 via TOPICAL

## 2020-08-05 MED ORDER — NICOTINE 21 MG/24HR TD PT24
21.0000 mg | MEDICATED_PATCH | Freq: Every day | TRANSDERMAL | Status: DC
  Administered 2020-08-05 – 2020-08-10 (×6): 21 mg via TRANSDERMAL
  Filled 2020-08-05 (×6): qty 1

## 2020-08-05 MED ORDER — LIDOCAINE 5 % EX PTCH: 1.0000 | MEDICATED_PATCH | CUTANEOUS | 0 refills | Status: DC

## 2020-08-05 MED ORDER — ACETAMINOPHEN 500 MG PO TABS
1000.0000 mg | ORAL_TABLET | Freq: Once | ORAL | Status: AC
  Administered 2020-08-05: 1000 mg via ORAL
  Filled 2020-08-05: qty 2

## 2020-08-05 MED ORDER — PANTOPRAZOLE SODIUM 40 MG PO TBEC
40.0000 mg | DELAYED_RELEASE_TABLET | Freq: Every day | ORAL | Status: DC
  Administered 2020-08-06 – 2020-08-10 (×5): 40 mg via ORAL
  Filled 2020-08-05 (×6): qty 1

## 2020-08-05 MED ORDER — SORBITOL 70 % SOLN
30.0000 mL | Freq: Every day | Status: DC | PRN
  Filled 2020-08-05: qty 30

## 2020-08-05 MED ORDER — LEVALBUTEROL HCL 0.63 MG/3ML IN NEBU: 0.6300 mg | INHALATION_SOLUTION | RESPIRATORY_TRACT | Status: DC | PRN

## 2020-08-05 MED ORDER — ONDANSETRON HCL 4 MG/2ML IJ SOLN: 4.0000 mg | Freq: Four times a day (QID) | INTRAMUSCULAR | Status: DC | PRN

## 2020-08-05 MED ORDER — LIDOCAINE 5 % EX PTCH
3.0000 | MEDICATED_PATCH | CUTANEOUS | Status: DC
  Administered 2020-08-05: 3 via TRANSDERMAL
  Filled 2020-08-05: qty 3

## 2020-08-05 MED ORDER — ACETAMINOPHEN 650 MG RE SUPP: 650.0000 mg | Freq: Four times a day (QID) | RECTAL | Status: DC | PRN

## 2020-08-05 MED ORDER — IOHEXOL 350 MG/ML SOLN
100.0000 mL | Freq: Once | INTRAVENOUS | Status: AC | PRN
  Administered 2020-08-05: 100 mL via INTRAVENOUS

## 2020-08-05 MED ORDER — HEPARIN BOLUS VIA INFUSION
6000.0000 [IU] | Freq: Once | INTRAVENOUS | Status: AC
  Administered 2020-08-05: 6000 [IU] via INTRAVENOUS

## 2020-08-05 MED ORDER — MELATONIN 3 MG PO TABS
3.0000 mg | ORAL_TABLET | Freq: Every day | ORAL | Status: DC
  Administered 2020-08-06 – 2020-08-09 (×5): 3 mg via ORAL
  Filled 2020-08-05 (×5): qty 1

## 2020-08-05 MED ORDER — IPRATROPIUM BROMIDE 0.02 % IN SOLN: 0.5000 mg | RESPIRATORY_TRACT | Status: DC | PRN

## 2020-08-05 MED ORDER — DICLOFENAC SODIUM 1 % EX GEL
4.0000 g | Freq: Four times a day (QID) | CUTANEOUS | Status: DC
  Administered 2020-08-05 – 2020-08-10 (×18): 4 g via TOPICAL
  Filled 2020-08-05: qty 100

## 2020-08-05 MED ORDER — ACETAMINOPHEN 325 MG PO TABS: 650.0000 mg | ORAL_TABLET | Freq: Four times a day (QID) | ORAL | Status: DC | PRN

## 2020-08-05 MED ORDER — POLYETHYLENE GLYCOL 3350 17 G PO PACK
17.0000 g | PACK | Freq: Every day | ORAL | Status: DC | PRN
  Administered 2020-08-07 (×2): 17 g via ORAL
  Filled 2020-08-05 (×2): qty 1

## 2020-08-05 NOTE — Consult Note (Signed)
NAME:  Frank Robinson, MRN:  [id number], DOB:  03/31/1980, LOS: 0 ADMISSION DATE:  08/05/2020, CONSULTATION DATE: 08/05/20 REFERRING MD: Isaias Cowman, CHIEF COMPLAINT: Leg pain/swelling/dyspnea  History of Present Illness:  40 year old man whom we are seeing in consultation for evaluation of submassive PE.  ED note reviewed.  Patient is about 1 month ago noted left leg with swelling.  Over the course neck several days developed pain, discomfort.  Pain discomfort worsened intensified over the last few days.  Got the point last night worse throbbing, he was in tears.  Decided come to the ED for further evaluation.  Notes associated symptom of dyspnea on exertion.  Has baseline dyspnea dyspnea attributed to weight as well as COVID infection fall 2021.  However over the last couple of weeks, the dyspnea exertion has worsened significantly.  Worse on inclines or stairs.  Present on flat surfaces as well.  No cough.  No hemoptysis.  No presyncope or syncope.  Has been evaluated PCP for this.  Was supposed to have lower extremity Dopplers performed.  These had not been scheduled.  In the ED, borderline tachycardia low 100s.  BP normal.  Unclear if was hypoxemic on room air but placed on 2 L, sats mid to high 90s.  CTA PE protocol was performed which demonstrated bilateral pulmonary emboli with evidence of right heart strain.  Troponin x2, BNP within normal limits.  BMP, CBC stable and largely within normal limits.  This is compared to values in 07/20/2020 and 10/21/2019 (COVID hospitalization).  He denies any history of stroke or brain bleed.  No spinal or neurosurgical procedures.  No history of GI bleeding.  No recent surgeries.  No recent travel or long trips.  He does have relative immobility since COVID infection fall 2021.  Relatively sedentary.  He does get up and walk throughout the day.  Notes have to go outside to his car to smoke as he is not allowed to smoke in the home he currently resides.  Does  spend considerable amount of time in a recliner.  No family history of VTE.  Father recently diagnosed with cancer, he did not specify which type or origin of cancer.  Pertinent  Medical History  Obesity, degenerative disc disease  Significant Hospital Events: Including procedures, antibiotic start and stop dates in addition to other pertinent events   7/6 ED, PE, DVT, admitted for submassive PE at [facility], SDU  Interim History / Subjective:  N/a  Objective   Blood pressure 117/87, pulse 81, temperature 98.5 F (36.9 C), temperature source Oral, resp. rate 12, height 6' (1.829 m), weight (!) 137.9 kg, SpO2 93 %.       No intake or output data in the 24 hours ending 08/05/20 1752 Filed Weights   08/05/20 1219  Weight: (!) 137.9 kg    Examination: General: sitting up, in NAD Eyes: EOMI, no icterus Neck: supple, no JVP appreciated Lungs: CTAB, NWOB, on RA, speaks in compete sentences Cardiovascular: RRR, no murmurs Abdomen: ND, BS present Extremities: L leg/ankle/calf appears larger than right, petehcial appearing skin discoloration red/brown on left leg calf down Neuro: no weakness, sensation intact Psych: Normal mood, full affect   Resolved Hospital Problem list     Assessment & Plan:  Acute submassive PE: R heart strain on CT, TTE ordered/pending. BNP, troponin negative. BP WNL. PESI score 69, low risk. Agree with heparin drip. No contraindication to tPa if needed in setting of decompensation. Unprovoked. Risk factor is obesity.  --heparin gtt --  TTE --need for f/u with pulm pending TTE results  OSA: AHI ~60 per description of report. Can not view. CPAP on back order so has not received machine. --CPAP at night  We will continue to follow.  Best Practice (right click and "Reselect all SmartList Selections" daily)   Per primary  Labs - personally reviewed  CBC: Recent Labs  Lab 08/05/20 1310  WBC 10.3  NEUTROABS 6.0  HGB 15.7  HCT 48.8  MCV 85.6  PLT 166     Basic Metabolic Panel: Recent Labs  Lab 08/05/20 1310  NA 135  K 3.8  CL 98  CO2 29  GLUCOSE 94  BUN 15  CREATININE 1.03  CALCIUM 9.0   GFR: Estimated Creatinine Clearance: 138.5 mL/min (by C-G formula based on SCr of 1.03 mg/dL). Recent Labs  Lab 08/05/20 1310  WBC 10.3    Liver Function Tests: No results for input(s): AST, ALT, ALKPHOS, BILITOT, PROT, ALBUMIN in the last 168 hours. No results for input(s): LIPASE, AMYLASE in the last 168 hours. No results for input(s): AMMONIA in the last 168 hours.  ABG    Component Value Date/Time   TCO2 26 12/20/2010 0826     Coagulation Profile: Recent Labs  Lab 08/05/20 1310  INR 1.0    Cardiac Enzymes: No results for input(s): CKTOTAL, CKMB, CKMBINDEX, TROPONINI in the last 168 hours.  HbA1C: No results found for: HGBA1C  CBG: No results for input(s): GLUCAP in the last 168 hours.  Review of Systems:   No orthopnea or PND. No cough or fever. No syncope or pre-syncope.  Comprehensive review of systems otherwise negative.  Past Medical History:  He,  has a past medical history of Anxiety, Asthma, Back pain, DDD (degenerative disc disease), lumbar, DDD (degenerative disc disease), lumbar, Difficult intubation, GERD (gastroesophageal reflux disease), High cholesterol, Kidney calculi, Kidney stones, and OSA (obstructive sleep apnea).   Surgical History:   Past Surgical History:  Procedure Laterality Date   CYSTOSCOPY W/ URETERAL STENT PLACEMENT  12/18/2010   Procedure: CYSTOSCOPY WITH RETROGRADE PYELOGRAM/URETERAL STENT PLACEMENT;  Surgeon: Lindaann Slough, MD;  Location: [facility];  Service: Urology;  Laterality: Left;  left ureteroscopy   LITHOTRIPSY       Social History:   reports that he has been smoking cigarettes. He has been smoking an average of 1.00 packs per day. He has never used smokeless tobacco. He reports current alcohol use. He reports current drug use.   Family History:  His family history is  not on file.   Allergies Allergies  Allergen Reactions   Latex Rash     Home Medications  Prior to Admission medications   Medication Sig Start Date End Date Taking? Authorizing Provider  acetaminophen (TYLENOL) 500 MG tablet Take 500 mg by mouth every 6 (six) hours as needed for moderate pain or headache.    [provider]  Cimetidine (HEARTBURN RELIEF PO) Take 1 tablet by mouth daily as needed (heartburn).    [provider]  diclofenac Sodium (VOLTAREN) 1 % GEL Apply 4 g topically 4 (four) times daily. 08/05/20   Palumbo, April, MD  lidocaine (LIDODERM) 5 % Place 1 patch onto the skin daily. Remove & Discard patch within 12 hours or as directed by MD 08/05/20   Nicanor Alcon, April, MD     Critical care time: n/a

## 2020-08-05 NOTE — ED Triage Notes (Signed)
Pt arrives via POV. Pt had outpatient [place] of left leg and was positive for blood clot in that leg. Pt now reporting shortness of breath.

## 2020-08-05 NOTE — Progress Notes (Signed)
ANTICOAGULATION CONSULT NOTE - Follow Up Consult  Pharmacy Consult for heparin Indication: pulmonary embolus and DVT  Allergies  Allergen Reactions   Latex Rash    Patient Measurements: Height: 6' (182.9 cm) Weight: (!) 137.9 kg (304 lb) IBW/kg (Calculated) : 77.6 Heparin Dosing Weight: 109.3kg  Vital Signs: Temp: 98.5 F (36.9 C) (07/06 1218) Temp Source: Oral (07/06 1218) BP: 139/77 (07/06 1655) Pulse Rate: 79 (07/06 1655)  Labs: Recent Labs    08/05/20 1310 08/05/20 1612 08/05/20 1931  HGB 15.7  --   --   HCT 48.8  --   --   PLT 166  --   --   LABPROT 13.6  --   --   INR 1.0  --   --   HEPARINUNFRC  --   --  0.18*  CREATININE 1.03  --   --   TROPONINIHS 16 13  --      Estimated Creatinine Clearance: 138.5 mL/min (by C-G formula based on SCr of 1.03 mg/dL).   Medical History: Past Medical History:  Diagnosis Date   Anxiety    Asthma    Back pain    DDD (degenerative disc disease), lumbar    DDD (degenerative disc disease), lumbar    Difficult intubation    GERD (gastroesophageal reflux disease)    High cholesterol    Kidney calculi    Kidney stones    OSA (obstructive sleep apnea)     Medications:  Infusions:   heparin 1,800 Units/hr (08/05/20 1347)    Assessment: 39 yom presented to the ED with leg pain and SOB. Found to have a DVT and multiple bilateral PE's with saddle PE and right heart strain. To start IV heparin. Baseline CBC is WNL and he is not on anticoagulation PTA.   08/05/2020  First heparin level subtherapeutic (0.18) on heparin 1800 units/hr No bleeding or complications/interruptions with infusion reported by RN  Goal of Therapy:  Heparin level 0.3-0.7 units/ml Monitor platelets by anticoagulation protocol: Yes   Plan:  Heparin bolus 3000 units IV x 1 Increase Heparin gtt 2100 units/hr Check a 6 hr heparin level Daily heparin level and CBC  Loralee Pacas, PharmD, BCPS Pharmacy: [phone] 08/05/2020,8:08 PM

## 2020-08-05 NOTE — H&P (Signed)
History and Physical    Frank Robinson KZS:[id number] DOB: [birth year] DOA: 08/05/2020  PCP: Maye Hides, PA (Confirm with patient/family/NH records and if not entered, this has to be entered at [facility] point of entry) Patient coming from: Home via [facility] freestanding ED  I have personally briefly reviewed patient's old medical records in [facility]  Chief Complaint: Shortness of breath/left lower extremity pain and swelling  HPI: Frank Robinson is a 39 y.o. male with medical history significant of COVID-19 diagnosed 09/29/2019 that led to respiratory failure requiring home O2 on discharge which has slowly improved, ongoing tobacco use, obesity, gastroesophageal reflux disease presented to the ED with left lower extremity pain and swelling which have worsened over the past month with associated worsening shortness of breath which has worsened to the point patient is short of breath on minimal exertion, chills, noted to be diaphoretic waking up in the morning upon all of selected patient, nausea which he related to GERD, generalized fatigue.  Patient denies any objective fevers, no emesis, no abdominal pain, no diarrhea, no constipation, no syncope, no melena, no hematemesis.  Patient does endorse occasional bleeding from hemorrhoids, occasional headaches.  Patient does endorse an episode of chest pain about a week ago and stated was assessed in the ED and diagnosed with reflux.  Patient with no other associated symptoms.  Patient denies any recent surgeries, no recent long car rides or travel, no prior history of DVT, no family history of DVT or PE.  ED Course: Patient seen in the ED lower extremity Dopplers done with DVT of the distal femoral vein extending through the popliteal vein into the peroneal tibial vein and intramuscular veins.  CT angiogram chest done with multiple bilateral pulmonary emboli including a saddle embolus at the bifurcation of the right pulmonary artery  into the right upper and descending interlobar pulmonary arteries as well as additional emboli in all pulmonary lobes bilaterally.  Positive acute PE with CT evidence of right heart strain consistent with at least submassive PE.  5 mm right lower lobe pulmonary nodule, nonspecific, slightly larger than on prior study from 2021.  Basic metabolic profile done unremarkable.  Troponin of 16, 13.  CBC unremarkable.  INR 1.0.  Glucose of 94.  COVID-19 PCR negative.  Patient placed on IV heparin, hospitalist called for admission.  Review of Systems: As per HPI otherwise all other systems reviewed and are negative.  Past Medical History:  Diagnosis Date   Anxiety    Asthma    Back pain    DDD (degenerative disc disease), lumbar    DDD (degenerative disc disease), lumbar    Difficult intubation    GERD (gastroesophageal reflux disease)    High cholesterol    Kidney calculi    Kidney stones    OSA (obstructive sleep apnea)     Past Surgical History:  Procedure Laterality Date   CYSTOSCOPY W/ URETERAL STENT PLACEMENT  12/18/2010   Procedure: CYSTOSCOPY WITH RETROGRADE PYELOGRAM/URETERAL STENT PLACEMENT;  Surgeon: Lindaann Slough, MD;  Location: [facility];  Service: Urology;  Laterality: Left;  left ureteroscopy   LITHOTRIPSY      Social History  reports that he has been smoking cigarettes. He has been smoking an average of 1.00 packs per day. He has never used smokeless tobacco. He reports current alcohol use. He reports current drug use.  Allergies  Allergen Reactions   Latex Rash    No family history on file. Father alive age 12 recently  diagnosed with lung cancer stage III per patient.  Mother alive age 82 with no significant medical history per patient. Prior to Admission medications   Medication Sig Start Date End Date Taking? Authorizing Provider  acetaminophen (TYLENOL) 500 MG tablet Take 500 mg by mouth every 6 (six) hours as needed for moderate pain or headache.    [provider]  Cimetidine (HEARTBURN RELIEF PO) Take 1 tablet by mouth daily as needed (heartburn).    [provider]  diclofenac Sodium (VOLTAREN) 1 % GEL Apply 4 g topically 4 (four) times daily. 08/05/20   Palumbo, April, MD  lidocaine (LIDODERM) 5 % Place 1 patch onto the skin daily. Remove & Discard patch within 12 hours or as directed by MD 08/05/20   Nicanor Alcon, April, MD    Physical Exam: Vitals:   08/05/20 1219 08/05/20 1333 08/05/20 1500 08/05/20 1530  BP:  110/74 119/81 117/87  Pulse:  94 86 81  Resp:  Temp:      TempSrc:      SpO2:  96% 95% 93%  Weight: (!) 137.9 kg     Height: 6' (1.829 m)       Constitutional: NAD, calm, comfortable Vitals:   08/05/20 1219 08/05/20 1333 08/05/20 1500 08/05/20 1530  BP:  110/74 119/81 117/87  Pulse:  94 86 81  Resp:  Temp:      TempSrc:      SpO2:  96% 95% 93%  Weight: (!) 137.9 kg     Height: 6' (1.829 m)      Eyes: PERRL, lids and conjunctivae normal ENMT: Mucous membranes are moist. Posterior pharynx clear of any exudate or lesions.Normal dentition.  Neck: normal, supple, no masses, no thyromegaly Respiratory: clear to auscultation bilaterally, no wheezing, no crackles. Normal respiratory effort. No accessory muscle use.  Cardiovascular: Regular rate and rhythm, no murmurs / rubs / gallops. No extremity edema. 2+ pedal pulses. No carotid bruits.  Abdomen: no tenderness, no masses palpated. No hepatosplenomegaly. Bowel sounds positive.  Musculoskeletal: no clubbing / cyanosis. No joint deformity upper and lower extremities. Good ROM, no contractures. Normal muscle tone.  Left calf pain.  Positive Homans' sign. Skin: no rashes, lesions, ulcers. No induration Neurologic: CN 2-12 grossly intact. Sensation intact, DTR normal. Strength 5/5 in all 4.  Psychiatric: Normal judgment and insight. Alert and oriented x 3. Normal mood.   Labs on Admission: I have personally reviewed following labs and imaging  studies  CBC: Recent Labs  Lab 08/05/20 1310  WBC 10.3  NEUTROABS 6.0  HGB 15.7  HCT 48.8  MCV 85.6  PLT 166    Basic Metabolic Panel: Recent Labs  Lab 08/05/20 1310  NA 135  K 3.8  CL 98  CO2 29  GLUCOSE 94  BUN 15  CREATININE 1.03  CALCIUM 9.0    GFR: Estimated Creatinine Clearance: 138.5 mL/min (by C-G formula based on SCr of 1.03 mg/dL).  Liver Function Tests: No results for input(s): AST, ALT, ALKPHOS, BILITOT, PROT, ALBUMIN in the last 168 hours.  Urine analysis:    Component Value Date/Time   COLORURINE YELLOW 10/12/2013 1220   APPEARANCEUR CLOUDY (A) 10/12/2013 1220   LABSPEC 1.018 10/12/2013 1220   PHURINE 6.5 10/12/2013 1220   GLUCOSEU NEGATIVE 10/12/2013 1220   HGBUR NEGATIVE 10/12/2013 1220   BILIRUBINUR NEGATIVE 10/12/2013 1220   KETONESUR NEGATIVE 10/12/2013 1220   PROTEINUR NEGATIVE 10/12/2013 1220   UROBILINOGEN 0.2 10/12/2013 1220  NITRITE NEGATIVE 10/12/2013 1220   LEUKOCYTESUR NEGATIVE 10/12/2013 1220    Radiological Exams on Admission: CT Angio Chest PE W and/or Wo Contrast  Result Date: 08/05/2020 CLINICAL DATA:  Chest pain, high clinical suspicion for pulmonary embolism, has acute deep venous thrombosis in LEFT lower extremity EXAM: CT ANGIOGRAPHY CHEST WITH CONTRAST TECHNIQUE: Multidetector CT imaging of the chest was performed using the standard protocol during bolus administration of intravenous contrast. Multiplanar CT image reconstructions and MIPs were obtained to evaluate the vascular anatomy. CONTRAST:  100mL OMNIPAQUE IOHEXOL 350 MG/ML SOLN IV COMPARISON:  CT chest 05/01/2019 FINDINGS: Cardiovascular: Aorta normal caliber without aneurysm or dissection. Multiple filling defects throughout the pulmonary arterial system bilaterally consistent with pulmonary embolism. These include pulmonary emboli in all lobes bilaterally including a saddle embolus at the bifurcation of the RIGHT pulmonary artery into RIGHT upper lobe and descending  interlobar pulmonary artery as well as bifurcation of the RIGHT lower lobe pulmonary artery. (RV/LV Ratio = 1.14, abnormal/elevated. Heart otherwise unremarkable. No pericardial effusion. Mediastinum/Nodes: Esophagus unremarkable. Base of cervical region normal appearance. No thoracic adenopathy. Lungs/Pleura: 5 mm RIGHT lower lobe nodule image 55, larger than on previous exam when it measured 2-3 mm, uncertain if related to interval growth or differences in slice averaging. Lungs clear. No pulmonary infiltrate, pleural effusion, or pneumothorax. Upper Abdomen: No upper abdominal abnormalities identified Musculoskeletal: Osseous structures unremarkable Review of the MIP images confirms the above findings. IMPRESSION: Multiple BILATERAL pulmonary emboli including a saddle embolus at the bifurcation of the RIGHT pulmonary artery into RIGHT upper and descending interlobar pulmonary arteries as well as additional emboli in all pulmonary lobes bilaterally. Positive for acute PE with CT evidence of right heart strain (RV/LV Ratio = 1.14) consistent with at least submassive (intermediate risk) PE. The presence of right heart strain has been associated with an increased risk of morbidity and mortality. Please refer to the "PE Focused" order set in EPIC. 5 mm RIGHT lower lobe pulmonary nodule, nonspecific, slightly larger than on the prior study from 2021; recommend follow-up imaging in 6 months to assess stability. Critical Value/emergent results were called by telephone at the time of interpretation on 08/05/2020 at 1:19 pm to provider Marianna FussICHARD DYKSTRA MD, who verbally acknowledged these results. Electronically Signed   By: Ulyses SouthwardMark  Boles M.D.   On: 08/05/2020 13:21   US Venous Img Lower Unilateral Left  Result Date: 08/05/2020 CLINICAL DATA:  40 year old male with a history of left lower extremity swelling EXAM: LEFT LOWER EXTREMITY VENOUS DOPPLER ULTRASOUND TECHNIQUE: Gray-scale sonography with graded compression, as well  as color Doppler and duplex ultrasound were performed to evaluate the lower extremity deep venous systems from the level of the common femoral vein and including the common femoral, femoral, profunda femoral, popliteal and calf veins including the posterior tibial, peroneal and gastrocnemius veins when visible. The superficial great saphenous vein was also interrogated. Spectral Doppler was utilized to evaluate flow at rest and with distal augmentation maneuvers in the common femoral, femoral and popliteal veins. COMPARISON:  None. FINDINGS: Contralateral Common Femoral Vein: Respiratory phasicity is normal and symmetric with the symptomatic side. No evidence of thrombus. Normal compressibility. Common Femoral Vein: No evidence of thrombus. Normal compressibility, respiratory phasicity and response to augmentation. Saphenofemoral Junction: No evidence of thrombus. Normal compressibility and flow on color Doppler imaging. Profunda Femoral Vein: No evidence of thrombus. Normal compressibility and flow on color Doppler imaging. Femoral Vein: Proximal left femoral vein patent. Distal left femoral vein noncompressible with occlusive thrombus. Thrombus extends  into the popliteal vein. Popliteal Vein: Noncompressible popliteal vein without flow. Calf Veins: Patent posterior tibial vein. Occlusive thrombus of the peroneal vein without compressibility. Superficial Great Saphenous Vein: No evidence of thrombus. Normal compressibility and flow on color Doppler imaging. Other Findings:  Gastrocs vein demonstrates occlusive thrombus. IMPRESSION: Positive sonographic survey of the left lower extremity demonstrating DVT of the distal femoral vein extending through the popliteal vein into the peroneal tibial vein and the intramuscular veins. These results were discussed by telephone at the time of interpretation on 08/05/2020 at 11:56 am with Dr. Stevie Kern Electronically Signed   By: Gilmer Mor D.O.   On: 08/05/2020 11:58    EKG:  Independently reviewed.  Normal sinus rhythm with no significant ischemic changes noted.  Assessment/Plan Principal Problem:   Acute saddle pulmonary embolus (HCC) Active Problems:   Pulmonary embolism (HCC)   Acute deep vein thrombosis (DVT) of left lower extremity (HCC)   Asthma   DDD (degenerative disc disease), lumbar   GERD (gastroesophageal reflux disease)   Hyperlipidemia   OSA (obstructive sleep apnea)   Tobacco abuse    1 bilateral PE/saddle embolism/left lower extremity DVT -Patient denies any recent long car rides/travel, no recent surgeries, no family history of DVT, no prior history of DVT. -Patient diagnosed with COVID-19 September 2019 likely etiology of patient's bilateral PE and lower extremity DVT. -Check a 2D echo to rule out right ventricular strain. -IV heparin for 72 hours and subsequently transition to oral anticoagulation. -Patient will likely need anywhere from 3 to 6 months of anticoagulation. -When anticoagulation has been completed may benefit from hypercoagulable panel. -Due to extensive bilateral PE/saddle embolism patient at increased risk for acute decompensation and as such we will consult with PCCM for further evaluation and management.  2.  Gastroesophageal reflux disease -Place on PPI daily. -GI cocktail as needed.  3.  Tobacco abuse -Tobacco cessation -Nicotine patch.  4.  OSA -Recently diagnosed per patient states uses 4 L nasal cannula O2 at bedtime while awaiting CPAP machine. -Placed on CPAP nightly.   DVT prophylaxis: Heparin Code Status:   Full Family Communication:  Updated patient.  No family at bedside Disposition Plan:   Patient is from:  Home  Anticipated DC to:  Home  Anticipated DC date:  4-5 days  Anticipated DC barriers: TBD  Consults called:  PCCM Admission status:  Admit to inpatient.  Severity of Illness: The appropriate patient status for this patient is INPATIENT. Inpatient status is judged to be reasonable and  necessary in order to provide the required intensity of service to ensure the patient's safety. The patient's presenting symptoms, physical exam findings, and initial radiographic and laboratory data in the context of their chronic comorbidities is felt to place them at high risk for further clinical deterioration. Furthermore, it is not anticipated that the patient will be medically stable for discharge from the hospital within 2 midnights of admission. The following factors support the patient status of inpatient.   " The patient's presenting symptoms include dyspnea on minimal exertion, left lower extremity pain and swelling. " The worrisome physical exam findings include dyspnea on exertion worsening with bilateral/saddle pulmonary embolism and left lower extremity DVT. " The initial radiographic and laboratory data are worrisome because of saddle pulmonary embolism with right ventricular strain. " The chronic co-morbidities include obstructive sleep apnea/tobacco use, history of COVID-19 September 2019..   * I certify that at the point of admission it is my clinical judgment that the patient will require  inpatient hospital care spanning beyond 2 midnights from the point of admission due to high intensity of service, high risk for further deterioration and high frequency of surveillance required.*    Ramiro Harvest MD [facility]  How to contact the [facility] Attending or Consulting provider 7A - 7P or covering provider during after hours 7P -7A, for this patient?   Check the care team in [facility] and look for a) attending/consulting [facility] provider listed and b) the [facility] team listed Log into [email or web address] and use [place]'s universal password to access. If you do not have the password, please contact the hospital operator. Locate the [facility] provider you are looking for under [facility] and page to a number that you can be directly reached. If you still have difficulty reaching the provider,  please page the [facility] (Director on Call) for the Hospitalists listed on amion for assistance.  08/05/2020, 5:54 PM

## 2020-08-05 NOTE — ED Notes (Signed)
Call placed to [facility] ICU to give report.  Receiving RN unable to take report at this time.  [facility] transporting patient

## 2020-08-05 NOTE — ED Provider Notes (Addendum)
[facility] EMERGENCY DEPARTMENT Provider Note   CSN: [id number] Arrival date & time: 08/05/20  1213     History Chief Complaint  Patient presents with   Shortness of Breath    Frank Robinson is a 40 y.o. male.  Presents to ER with concern for shortness of breath.  Patient reports ever since having COVID in September of last year he has had some shortness of breath but over the past month this seems to be worse.  Worse with exertion.  Also over the past month or so has noted some pain and swelling in his left leg.  Relatively constant, no alleviating or aggravating factors, worse behind his left knee.  He was seen in the emergency room last night and came back today for an ultrasound which was positive for acute DVT.  HPI     Past Medical History:  Diagnosis Date   Asthma    Back pain    DDD (degenerative disc disease), lumbar    DDD (degenerative disc disease), lumbar    Difficult intubation    GERD (gastroesophageal reflux disease)    High cholesterol    Kidney calculi    Kidney stones    OSA (obstructive sleep apnea)     Patient Active Problem List   Diagnosis Date Noted   Asthma 09/30/2019   DDD (degenerative disc disease), lumbar 09/30/2019   GERD (gastroesophageal reflux disease) 09/30/2019   Hyperlipidemia 09/30/2019   Acute hypoxemic respiratory failure due to COVID-19 (HCC) 09/29/2019    Past Surgical History:  Procedure Laterality Date   CYSTOSCOPY W/ URETERAL STENT PLACEMENT  12/18/2010   Procedure: CYSTOSCOPY WITH RETROGRADE PYELOGRAM/URETERAL STENT PLACEMENT;  Surgeon: Lindaann Slough, MD;  Location: [facility];  Service: Urology;  Laterality: Left;  left ureteroscopy   LITHOTRIPSY         No family history on file.  Social History   Tobacco Use   Smoking status: Every Day    Packs/day: 1.00    Pack years: 0.00    Types: Cigarettes   Smokeless tobacco: Never  Vaping Use   Vaping Use: Some days  Substance Use Topics   Alcohol use:  Yes    Comment: occasionally   Drug use: Yes    Home Medications Prior to Admission medications   Medication Sig Start Date End Date Taking? Authorizing Provider  acetaminophen (TYLENOL) 500 MG tablet Take 500 mg by mouth every 6 (six) hours as needed for moderate pain or headache.    [provider]  Cimetidine (HEARTBURN RELIEF PO) Take 1 tablet by mouth daily as needed (heartburn).    [provider]  diclofenac Sodium (VOLTAREN) 1 % GEL Apply 4 g topically 4 (four) times daily. 08/05/20   Palumbo, April, MD  lidocaine (LIDODERM) 5 % Place 1 patch onto the skin daily. Remove & Discard patch within 12 hours or as directed by MD 08/05/20   Nicanor Alcon, April, MD    Allergies    Latex  Review of Systems   Review of Systems  Constitutional:  Positive for fatigue. Negative for chills and fever.  HENT:  Negative for ear pain and sore throat.   Eyes:  Negative for pain and visual disturbance.  Respiratory:  Positive for shortness of breath. Negative for cough.   Cardiovascular:  Positive for chest pain. Negative for palpitations.  Gastrointestinal:  Negative for abdominal pain and vomiting.  Genitourinary:  Negative for dysuria and hematuria.  Musculoskeletal:  Positive for arthralgias. Negative for back pain.  Skin:  Negative for color change and rash.  Neurological:  Negative for seizures and syncope.  All other systems reviewed and are negative.  Physical Exam Updated Vital Signs BP 110/74 (BP Location: Right Arm)   Pulse 94   Temp 98.5 F (36.9 C) (Oral)   Resp 16   Ht 6' (1.829 m)   Wt (!) 137.9 kg   SpO2 96%   BMI 41.23 kg/m   Physical Exam Vitals and nursing note reviewed.  Constitutional:      Appearance: He is well-developed.  HENT:     Head: Normocephalic and atraumatic.  Eyes:     Conjunctiva/sclera: Conjunctivae normal.  Cardiovascular:     Rate and Rhythm: Normal rate and regular rhythm.     Heart sounds: No murmur heard. Pulmonary:      Effort: Pulmonary effort is normal. No respiratory distress.     Breath sounds: Normal breath sounds.  Chest:     Chest wall: No tenderness or crepitus.  Abdominal:     Palpations: Abdomen is soft.     Tenderness: There is no abdominal tenderness.  Musculoskeletal:     Cervical back: Neck supple.     Comments: LLE: Mild swelling in the left calf, mild tenderness in the popliteal fossa, normal DP/PT pulse, sensation intact  Skin:    General: Skin is warm and dry.  Neurological:     Mental Status: He is alert.    ED Results / Procedures / Treatments   Labs (all labs ordered are listed, but only abnormal results are displayed) Labs Reviewed  CBC WITH DIFFERENTIAL/PLATELET - Abnormal; Notable for the following components:      Result Value   RDW 15.9 (*)    All other components within normal limits  RESP PANEL BY RT-PCR (FLU A&B, COVID) ARPGX2  BASIC METABOLIC PANEL  BRAIN NATRIURETIC PEPTIDE  PROTIME-INR  HEPARIN LEVEL (UNFRACTIONATED)  TROPONIN I (HIGH SENSITIVITY)  TROPONIN I (HIGH SENSITIVITY)    EKG None  Radiology CT Angio Chest PE W and/or Wo Contrast  Result Date: 08/05/2020 CLINICAL DATA:  Chest pain, high clinical suspicion for pulmonary embolism, has acute deep venous thrombosis in LEFT lower extremity EXAM: CT ANGIOGRAPHY CHEST WITH CONTRAST TECHNIQUE: Multidetector CT imaging of the chest was performed using the standard protocol during bolus administration of intravenous contrast. Multiplanar CT image reconstructions and MIPs were obtained to evaluate the vascular anatomy. CONTRAST:  100mL OMNIPAQUE IOHEXOL 350 MG/ML SOLN IV COMPARISON:  CT chest 05/01/2019 FINDINGS: Cardiovascular: Aorta normal caliber without aneurysm or dissection. Multiple filling defects throughout the pulmonary arterial system bilaterally consistent with pulmonary embolism. These include pulmonary emboli in all lobes bilaterally including a saddle embolus at the bifurcation of the RIGHT pulmonary  artery into RIGHT upper lobe and descending interlobar pulmonary artery as well as bifurcation of the RIGHT lower lobe pulmonary artery. (RV/LV Ratio = 1.14, abnormal/elevated. Heart otherwise unremarkable. No pericardial effusion. Mediastinum/Nodes: Esophagus unremarkable. Base of cervical region normal appearance. No thoracic adenopathy. Lungs/Pleura: 5 mm RIGHT lower lobe nodule image 55, larger than on previous exam when it measured 2-3 mm, uncertain if related to interval growth or differences in slice averaging. Lungs clear. No pulmonary infiltrate, pleural effusion, or pneumothorax. Upper Abdomen: No upper abdominal abnormalities identified Musculoskeletal: Osseous structures unremarkable Review of the MIP images confirms the above findings. IMPRESSION: Multiple BILATERAL pulmonary emboli including a saddle embolus at the bifurcation of the RIGHT pulmonary artery into RIGHT upper and descending interlobar pulmonary arteries as well as additional  emboli in all pulmonary lobes bilaterally. Positive for acute PE with CT evidence of right heart strain (RV/LV Ratio = 1.14) consistent with at least submassive (intermediate risk) PE. The presence of right heart strain has been associated with an increased risk of morbidity and mortality. Please refer to the "PE Focused" order set in EPIC. 5 mm RIGHT lower lobe pulmonary nodule, nonspecific, slightly larger than on the prior study from 2021; recommend follow-up imaging in 6 months to assess stability. Critical Value/emergent results were called by telephone at the time of interpretation on 08/05/2020 at 1:19 pm to provider Marianna Fuss MD, who verbally acknowledged these results. Electronically Signed   By: Ulyses Southward M.D.   On: 08/05/2020 13:21   US Venous Img Lower Unilateral Left  Result Date: 08/05/2020 CLINICAL DATA:  40 year old male with a history of left lower extremity swelling EXAM: LEFT LOWER EXTREMITY VENOUS DOPPLER ULTRASOUND TECHNIQUE: Gray-scale  sonography with graded compression, as well as color Doppler and duplex ultrasound were performed to evaluate the lower extremity deep venous systems from the level of the common femoral vein and including the common femoral, femoral, profunda femoral, popliteal and calf veins including the posterior tibial, peroneal and gastrocnemius veins when visible. The superficial great saphenous vein was also interrogated. Spectral Doppler was utilized to evaluate flow at rest and with distal augmentation maneuvers in the common femoral, femoral and popliteal veins. COMPARISON:  None. FINDINGS: Contralateral Common Femoral Vein: Respiratory phasicity is normal and symmetric with the symptomatic side. No evidence of thrombus. Normal compressibility. Common Femoral Vein: No evidence of thrombus. Normal compressibility, respiratory phasicity and response to augmentation. Saphenofemoral Junction: No evidence of thrombus. Normal compressibility and flow on color Doppler imaging. Profunda Femoral Vein: No evidence of thrombus. Normal compressibility and flow on color Doppler imaging. Femoral Vein: Proximal left femoral vein patent. Distal left femoral vein noncompressible with occlusive thrombus. Thrombus extends into the popliteal vein. Popliteal Vein: Noncompressible popliteal vein without flow. Calf Veins: Patent posterior tibial vein. Occlusive thrombus of the peroneal vein without compressibility. Superficial Great Saphenous Vein: No evidence of thrombus. Normal compressibility and flow on color Doppler imaging. Other Findings:  Gastrocs vein demonstrates occlusive thrombus. IMPRESSION: Positive sonographic survey of the left lower extremity demonstrating DVT of the distal femoral vein extending through the popliteal vein into the peroneal tibial vein and the intramuscular veins. These results were discussed by telephone at the time of interpretation on 08/05/2020 at 11:56 am with Dr. Stevie Kern Electronically Signed   By: Gilmer Mor D.O.   On: 08/05/2020 11:58    Procedures .Critical Care  Date/Time: 08/05/2020 2:00 PM Performed by: Milagros Loll, MD Authorized by: Milagros Loll, MD   Critical care provider statement:    Critical care time (minutes):  42   Critical care was necessary to treat or prevent imminent or life-threatening deterioration of the following conditions:  Respiratory failure   Critical care was time spent personally by me on the following activities:  Discussions with consultants, evaluation of patient's response to treatment, examination of patient, ordering and performing treatments and interventions, ordering and review of laboratory studies, ordering and review of radiographic studies, pulse oximetry, re-evaluation of patient's condition, obtaining history from patient or surrogate and review of old charts   Medications Ordered in ED Medications  heparin ADULT infusion 100 units/mL (25000 units/213mL) (1,800 Units/hr Intravenous New Bag/Given 08/05/20 1347)  iohexol (OMNIPAQUE) 350 MG/ML injection 100 mL (100 mLs Intravenous Contrast Given 08/05/20 1229)  heparin bolus via  infusion 6,000 Units (6,000 Units Intravenous Bolus from Bag 08/05/20 1340)    ED Course  I have reviewed the triage vital signs and the nursing notes.  Pertinent labs & imaging results that were available during my care of the patient were reviewed by me and considered in my medical decision making (see chart for details).  Clinical Course as of 08/05/20 1424  Wed Aug 05, 2020  1419 CT Angio Chest PE W and/or Wo Contrast [LH]    Clinical Course User Index [LH] Hauge, Devonne Doughty   MDM Rules/Calculators/A&P                          40 year old male presents to ER with concern for abnormal DVT study and shortness of breath.  On exam patient actually looks quite well-appearing, vitals are stable, currently on room air.  His DVT study was positive for acute DVT in the distal femoral vein extending  through the popliteal vein into peroneal vein.  Patient did endorse his shortness of breath seems to be somewhat worse over the past month although has had shortness of breath over the past many months that he had previously associated with long COVID.  Given the finding of DVT, recommended getting labs and PE study.  CT PE study concerning for quite extensive PE burden including component of saddle embolus in the right main pulmonary artery.  Evidence for right heart strain on CT.  Notably troponin and BNP negative.  Consulted pharmacy for heparin.  We will consult [facility] hospitalist for admission for further management.    Discussed with hospitalist - he is going to review with pulm. Anticipate most likely admit [facility] unless pulm requests otherwise.   Final Clinical Impression(s) / ED Diagnoses Final diagnoses:  Pulmonary embolism, unspecified chronicity, unspecified pulmonary embolism type, unspecified whether acute cor pulmonale present (HCC)  Acute deep vein thrombosis (DVT) of left lower extremity, unspecified vein (HCC)    Rx / DC Orders ED Discharge Orders     None        Milagros Loll, MD 08/05/20 1400    Milagros Loll, MD 08/05/20 1425

## 2020-08-05 NOTE — ED Triage Notes (Signed)
Pt is c/o left leg pain   Pt states he has been to his regular doctor for same  Pt states he feels like he has a knot in his calf   Pt states he has some brown discoloration to his lower leg  Pt states he is having pain in his groin area also

## 2020-08-05 NOTE — ED Provider Notes (Signed)
[facility] EMERGENCY DEPARTMENT Provider Note   CSN: [id number] Arrival date & time: 08/05/20  0450     History Chief Complaint  Patient presents with   Leg Pain    Frank Robinson is a 40 y.o. male.  The history is provided by the patient.  Leg Pain Location:  Leg Time since incident:  1 month Injury: no   Leg location:  L lower leg Pain details:    Quality:  Aching   Radiates to:  Does not radiate   Severity:  Moderate   Onset quality:  Gradual   Duration:  1 month   Timing:  Constant   Progression:  Unchanged Chronicity:  New Foreign body present:  No foreign bodies Prior injury to area:  No Relieved by:  Nothing Worsened by:  Nothing Ineffective treatments:  None tried Associated symptoms: no back pain, no decreased ROM, no fatigue, no fever, no itching, no muscle weakness, no neck pain, no numbness, no stiffness, no swelling and no tingling   Risk factors: no concern for non-accidental trauma   Patient with a month of LLE pain without known injury.  No travel. No surgeries.  Has seen PMD who told him to rest.  No f/c/r.  No color changes. Denies chest pain and shortness of breath.      Past Medical History:  Diagnosis Date   Asthma    Back pain    DDD (degenerative disc disease), lumbar    DDD (degenerative disc disease), lumbar    Difficult intubation    GERD (gastroesophageal reflux disease)    High cholesterol    Kidney calculi    Kidney stones    OSA (obstructive sleep apnea)     Patient Active Problem List   Diagnosis Date Noted   Asthma 09/30/2019   DDD (degenerative disc disease), lumbar 09/30/2019   GERD (gastroesophageal reflux disease) 09/30/2019   Hyperlipidemia 09/30/2019   Acute hypoxemic respiratory failure due to COVID-19 (HCC) 09/29/2019    Past Surgical History:  Procedure Laterality Date   CYSTOSCOPY W/ URETERAL STENT PLACEMENT  12/18/2010   Procedure: CYSTOSCOPY WITH RETROGRADE PYELOGRAM/URETERAL STENT PLACEMENT;   Surgeon: Lindaann Slough, MD;  Location: [facility];  Service: Urology;  Laterality: Left;  left ureteroscopy   LITHOTRIPSY         History reviewed. No pertinent family history.  Social History   Tobacco Use   Smoking status: Every Day    Packs/day: 1.00    Pack years: 0.00    Types: Cigarettes   Smokeless tobacco: Never  Vaping Use   Vaping Use: Some days  Substance Use Topics   Alcohol use: Yes    Comment: occasionally   Drug use: Yes    Home Medications Prior to Admission medications   Medication Sig Start Date End Date Taking? Authorizing Provider  acetaminophen (TYLENOL) 500 MG tablet Take 500 mg by mouth every 6 (six) hours as needed for moderate pain or headache.    [provider]  Cimetidine (HEARTBURN RELIEF PO) Take 1 tablet by mouth daily as needed (heartburn).    [provider]    Allergies    Latex  Review of Systems   Review of Systems  Constitutional:  Negative for fatigue and fever.  HENT:  Negative for drooling.   Eyes:  Negative for redness.  Respiratory:  Negative for chest tightness and shortness of breath.   Cardiovascular:  Negative for chest pain, palpitations and leg swelling.  Gastrointestinal:  Negative for abdominal  pain.  Genitourinary:  Negative for hematuria.  Musculoskeletal:  Negative for back pain, neck pain and stiffness.  Skin:  Negative for itching and rash.  Neurological:  Negative for facial asymmetry.  Psychiatric/Behavioral:  Negative for agitation.    Physical Exam Updated Vital Signs BP (!) 148/95 (BP Location: Right Arm)   Pulse (!) 106   Temp 98 F (36.7 C) (Oral)   Resp 18   Ht 6' (1.829 m)   Wt (!) 137.9 kg   SpO2 93%   BMI 41.23 kg/m   Physical Exam Vitals and nursing note reviewed.  Constitutional:      General: He is not in acute distress.    Appearance: Normal appearance.  HENT:     Head: Normocephalic and atraumatic.     Nose: Nose normal.  Eyes:     Conjunctiva/sclera:  Conjunctivae normal.     Pupils: Pupils are equal, round, and reactive to light.  Cardiovascular:     Rate and Rhythm: Normal rate and regular rhythm.     Pulses: Normal pulses.     Heart sounds: Normal heart sounds.  Pulmonary:     Effort: Pulmonary effort is normal.     Breath sounds: Normal breath sounds.  Abdominal:     General: Abdomen is flat. Bowel sounds are normal.     Palpations: Abdomen is soft.     Tenderness: There is no abdominal tenderness. There is no guarding.  Musculoskeletal:        General: Normal range of motion.     Cervical back: Normal range of motion and neck supple.  Skin:    General: Skin is warm and dry.     Capillary Refill: Capillary refill takes less than 2 seconds.  Neurological:     General: No focal deficit present.     Mental Status: He is alert and oriented to person, place, and time.     Deep Tendon Reflexes: Reflexes normal.  Psychiatric:        Mood and Affect: Mood normal.        Behavior: Behavior normal.    ED Results / Procedures / Treatments   Labs (all labs ordered are listed, but only abnormal results are displayed) Labs Reviewed - No data to display  EKG None  Radiology No results found.  Procedures Procedures   Medications Ordered in ED Medications  lidocaine (LIDODERM) 5 % 3 patch (3 patches Transdermal Patch Applied 08/05/20 0510)  naproxen (NAPROSYN) tablet 500 mg (500 mg Oral Given 08/05/20 0509)  acetaminophen (TYLENOL) tablet 1,000 mg (1,000 mg Oral Given 08/05/20 2549)    ED Course  I have reviewed the triage vital signs and the nursing notes.  Pertinent labs & imaging results that were available during my care of the patient were reviewed by me and considered in my medical decision making (see chart for details).   Has persistently low saturation from long covid.  I do not believe the patient have a PE.  Wells score is 0.  I have ordered an outpatient DVT study for today.  Patient is stable for discharge with  close follow up.     Frank Robinson was evaluated in Emergency Department on 08/05/2020 for the symptoms described in the history of present illness. He was evaluated in the context of the global COVID-19 pandemic, which necessitated consideration that the patient might be at risk for infection with the SARS-CoV-2 virus that causes COVID-19. Institutional protocols and algorithms that pertain to the evaluation of patients  at risk for COVID-19 are in a state of rapid change based on information released by regulatory bodies including the CDC and federal and state organizations. These policies and algorithms were followed during the patient's care in the ED.     Final Clinical Impression(s) / ED Diagnoses Final diagnoses:  None    Return for intractable cough, coughing up blood, fevers > 100.4 unrelieved by medication, shortness of breath, intractable vomiting, chest pain, shortness of breath, weakness, numbness, changes in speech, facial asymmetry, abdominal pain, passing out, Inability to tolerate liquids or food, cough, altered mental status or any concerns. No signs of systemic illness or infection. The patient is nontoxic-appearing on exam and vital signs are within normal limits. I have reviewed the triage vital signs and the nursing notes. Pertinent labs & imaging results that were available during my care of the patient were reviewed by me and considered in my medical decision making (see chart for details). After history, exam, and medical workup I feel the patient has been appropriately medically screened and is safe for discharge home. Pertinent diagnoses were discussed with the patient. Patient was given return precautions.  Rx / DC Orders ED Discharge Orders          Ordered    US Venous Img Lower Unilateral Left        08/05/20 0504             Verbie Babic, MD 08/05/20 7209

## 2020-08-05 NOTE — ED Notes (Signed)
ED Provider at bedside. 

## 2020-08-05 NOTE — Progress Notes (Signed)
ANTICOAGULATION CONSULT NOTE - Initial Consult  Pharmacy Consult for heparin Indication: pulmonary embolus and DVT  Allergies  Allergen Reactions   Latex Rash    Patient Measurements: Height: 6' (182.9 cm) Weight: (!) 137.9 kg (304 lb) IBW/kg (Calculated) : 77.6 Heparin Dosing Weight: 109.3kg  Vital Signs: Temp: 98.5 F (36.9 C) (07/06 1218) Temp Source: Oral (07/06 1218) BP: 123/95 (07/06 1218) Pulse Rate: 106 (07/06 1218)  Labs: Recent Labs    08/05/20 1310  HGB 15.7  HCT 48.8  PLT 166    Estimated Creatinine Clearance: 156.8 mL/min (by C-G formula based on SCr of 0.91 mg/dL).   Medical History: Past Medical History:  Diagnosis Date   Asthma    Back pain    DDD (degenerative disc disease), lumbar    DDD (degenerative disc disease), lumbar    Difficult intubation    GERD (gastroesophageal reflux disease)    High cholesterol    Kidney calculi    Kidney stones    OSA (obstructive sleep apnea)     Medications:  Infusions:   heparin      Assessment: 39 yom presented to the ED with leg pain and SOB. Found to have a DVT and multiple bilateral PE's with saddle PE and right heart strain. To start IV heparin. Baseline CBC is WNL and he is not on anticoagulation PTA.   Goal of Therapy:  Heparin level 0.3-0.7 units/ml Monitor platelets by anticoagulation protocol: Yes   Plan:  Heparin bolus 6000 units IV x 1 Heparin gtt 1800 units/hr Check a 6 hr heparin level Daily heparin level and CBC  Shakendra Griffeth, Drake Leach 08/05/2020,1:25 PM

## 2020-08-06 ENCOUNTER — Encounter (HOSPITAL_COMMUNITY): Payer: Self-pay | Admitting: Internal Medicine

## 2020-08-06 ENCOUNTER — Inpatient Hospital Stay (HOSPITAL_COMMUNITY): Payer: Medicaid Other

## 2020-08-06 DIAGNOSIS — I2609 Other pulmonary embolism with acute cor pulmonale: Secondary | ICD-10-CM

## 2020-08-06 LAB — CBC
HCT: 48.4 % (ref 39.0–52.0)
Hemoglobin: 15.3 g/dL (ref 13.0–17.0)
MCH: 27.5 pg (ref 26.0–34.0)
MCHC: 31.6 g/dL (ref 30.0–36.0)
MCV: 87.1 fL (ref 80.0–100.0)
Platelets: 145 10*3/uL — ABNORMAL LOW (ref 150–400)
RBC: 5.56 MIL/uL (ref 4.22–5.81)
RDW: 16.3 % — ABNORMAL HIGH (ref 11.5–15.5)
WBC: 8.4 10*3/uL (ref 4.0–10.5)
nRBC: 0 % (ref 0.0–0.2)

## 2020-08-06 LAB — HEPARIN LEVEL (UNFRACTIONATED)
Heparin Unfractionated: 0.3 IU/mL (ref 0.30–0.70)
Heparin Unfractionated: 0.29 IU/mL — ABNORMAL LOW (ref 0.30–0.70)
Heparin Unfractionated: 0.38 IU/mL (ref 0.30–0.70)

## 2020-08-06 LAB — COMPREHENSIVE METABOLIC PANEL
ALT: 30 U/L (ref 0–44)
AST: 28 U/L (ref 15–41)
Albumin: 3.6 g/dL (ref 3.5–5.0)
Alkaline Phosphatase: 59 U/L (ref 38–126)
Anion gap: 11 (ref 5–15)
BUN: 18 mg/dL (ref 6–20)
CO2: 28 mmol/L (ref 22–32)
Calcium: 9.2 mg/dL (ref 8.9–10.3)
Chloride: 99 mmol/L (ref 98–111)
Creatinine, Ser: 1.06 mg/dL (ref 0.61–1.24)
GFR, Estimated: >60 mL/min (ref >60)
Glucose, Bld: 96 mg/dL (ref 70–99)
Potassium: 4.3 mmol/L (ref 3.5–5.1)
Sodium: 138 mmol/L (ref 135–145)
Total Bilirubin: 1.1 mg/dL (ref 0.3–1.2)
Total Protein: 6.8 g/dL (ref 6.5–8.1)

## 2020-08-06 LAB — MAGNESIUM: Magnesium: 2.1 mg/dL (ref 1.7–2.4)

## 2020-08-06 LAB — PHOSPHORUS: Phosphorus: 5.3 mg/dL — ABNORMAL HIGH (ref 2.5–4.6)

## 2020-08-06 LAB — ECHOCARDIOGRAM COMPLETE
Area-P 1/2: 3.11 cm2
Height: 72 in
S' Lateral: 3.7 cm
Single Plane A4C EF: 62.6 %
Weight: 4740.77 oz

## 2020-08-06 LAB — HIV ANTIBODY (ROUTINE TESTING W REFLEX): HIV Screen 4th Generation wRfx: NONREACTIVE

## 2020-08-06 MED ORDER — PERFLUTREN LIPID MICROSPHERE
1.0000 mL | INTRAVENOUS | Status: AC | PRN
  Administered 2020-08-06: 2 mL via INTRAVENOUS
  Filled 2020-08-06: qty 10

## 2020-08-06 MED ORDER — LEVALBUTEROL HCL 1.25 MG/0.5ML IN NEBU
1.2500 mg | INHALATION_SOLUTION | Freq: Three times a day (TID) | RESPIRATORY_TRACT | Status: DC
  Administered 2020-08-06 – 2020-08-07 (×3): 1.25 mg via RESPIRATORY_TRACT
  Filled 2020-08-06 (×3): qty 0.5

## 2020-08-06 MED ORDER — IPRATROPIUM BROMIDE 0.02 % IN SOLN
0.5000 mg | Freq: Three times a day (TID) | RESPIRATORY_TRACT | Status: DC
  Administered 2020-08-06 – 2020-08-07 (×3): 0.5 mg via RESPIRATORY_TRACT
  Filled 2020-08-06 (×3): qty 2.5

## 2020-08-06 MED ORDER — HEPARIN BOLUS VIA INFUSION
1500.0000 [IU] | Freq: Once | INTRAVENOUS | Status: AC
  Administered 2020-08-06: 1500 [IU] via INTRAVENOUS
  Filled 2020-08-06: qty 1500

## 2020-08-06 MED ORDER — METHYLPREDNISOLONE SODIUM SUCC 125 MG IJ SOLR
60.0000 mg | Freq: Once | INTRAMUSCULAR | Status: AC
  Administered 2020-08-06: 60 mg via INTRAVENOUS
  Filled 2020-08-06: qty 2

## 2020-08-06 MED ORDER — MOMETASONE FURO-FORMOTEROL FUM 200-5 MCG/ACT IN AERO
2.0000 | INHALATION_SPRAY | Freq: Two times a day (BID) | RESPIRATORY_TRACT | Status: DC
  Administered 2020-08-06 – 2020-08-10 (×8): 2 via RESPIRATORY_TRACT
  Filled 2020-08-06: qty 8.8

## 2020-08-06 MED ORDER — MOMETASONE FURO-FORMOTEROL FUM 200-5 MCG/ACT IN AERO: 2.0000 | INHALATION_SPRAY | Freq: Two times a day (BID) | RESPIRATORY_TRACT | Status: DC

## 2020-08-06 MED ORDER — LEVALBUTEROL HCL 1.25 MG/0.5ML IN NEBU: 1.2500 mg | INHALATION_SOLUTION | Freq: Three times a day (TID) | RESPIRATORY_TRACT | Status: DC

## 2020-08-06 MED ORDER — HEPARIN (PORCINE) 25000 UT/250ML-% IV SOLN
2600.0000 [IU]/h | INTRAVENOUS | Status: AC
  Administered 2020-08-06 (×2): 2300 [IU]/h via INTRAVENOUS
  Administered 2020-08-07 – 2020-08-08 (×3): 2600 [IU]/h via INTRAVENOUS
  Filled 2020-08-06 (×8): qty 250

## 2020-08-06 MED ORDER — IPRATROPIUM BROMIDE 0.02 % IN SOLN: 0.5000 mg | Freq: Three times a day (TID) | RESPIRATORY_TRACT | Status: DC

## 2020-08-06 NOTE — Progress Notes (Signed)
PT Cancellation Note  Patient Details Name: Emiliano Welshans MRN: [id number] DOB: 1980/04/14   Cancelled Treatment:    PT order received but eval deferred this date - pt with PE and DVT.  Per therapy protocol - IV heparin must be given 24 hours prior to therapy evaluation. Initial Heparin dose given at 0329. Will f/u tomorrow as able.    Crystal Scarberry 08/06/2020, 7:59 AM

## 2020-08-06 NOTE — Progress Notes (Signed)
ANTICOAGULATION CONSULT NOTE - Follow Up Consult  Pharmacy Consult for heparin Indication: pulmonary embolus and DVT  Allergies  Allergen Reactions   Latex Rash   Patient Measurements: Height: 6' (182.9 cm) Weight: 134.4 kg (296 lb 4.8 oz) IBW/kg (Calculated) : 77.6 Heparin Dosing Weight: 109.3kg  Vital Signs: Temp: 98 F (36.7 C) (07/07 1919) Temp Source: Oral (07/07 1919) BP: 127/77 (07/07 1800) Pulse Rate: 91 (07/07 1800)  Labs: Recent Labs    08/05/20 1310 08/05/20 1612 08/05/20 1931 08/06/20 0250 08/06/20 0935 08/06/20 1720  HGB 15.7  --   --  15.3  --   --   HCT 48.8  --   --  48.4  --   --   PLT 166  --   --  145*  --   --   LABPROT 13.6  --   --   --   --   --   INR 1.0  --   --   --   --   --   HEPARINUNFRC  --   --    < > 0.30 0.29* 0.38  CREATININE 1.03  --   --  1.06  --   --   TROPONINIHS 16 13  --   --   --   --    < > = values in this interval not displayed.    Estimated Creatinine Clearance: 132.7 mL/min (by C-G formula based on SCr of 1.06 mg/dL).  edical History: Past Medical History:  Diagnosis Date   Anxiety    Asthma    Back pain    DDD (degenerative disc disease), lumbar    DDD (degenerative disc disease), lumbar    Difficult intubation    GERD (gastroesophageal reflux disease)    High cholesterol    Kidney calculi    Kidney stones    OSA (obstructive sleep apnea)    Medications:  Infusions:   heparin 2,300 Units/hr (08/06/20 1400)    Assessment: 39 yom presented to the ED with leg pain and SOB. Found to have a DVT and multiple bilateral PE's with saddle PE and right heart strain. To start IV heparin. Baseline CBC is WNL and he is not on anticoagulation PTA.   08/06/2020 evening: 1720 Hep level 0.38, therapeutic, with heparin infusing at 2300 units/hr Hgb = 15.3  Pltc = 145K No bleeding or complications/interruptions with infusion reported by RN  Goal of Therapy:  Heparin level 0.3-0.7 units/ml Monitor platelets by  anticoagulation protocol: Yes   Plan:  Continue heparin 2300 units/hr 6 hr confirmatory heparin level Monitor daily heparin level, CBC, s/s bleeding   Dmitriy Gair P. Casimiro Needle, PharmD, BCPS Clinical Pharmacist [place] Please utilize Amion for appropriate phone number to reach the unit pharmacist [facility]) 08/06/2020 7:49 PM

## 2020-08-06 NOTE — Progress Notes (Signed)
ANTICOAGULATION CONSULT NOTE - Follow Up Consult  Pharmacy Consult for heparin Indication: pulmonary embolus and DVT  Allergies  Allergen Reactions   Latex Rash    Patient Measurements: Height: 6' (182.9 cm) Weight: (!) 137.9 kg (304 lb) IBW/kg (Calculated) : 77.6 Heparin Dosing Weight: 109.3kg  Vital Signs: Temp: 97.8 F (36.6 C) (07/06 2300) Temp Source: Oral (07/06 2300) BP: 116/85 (07/07 0200) Pulse Rate: 71 (07/07 0200)  Labs: Recent Labs    08/05/20 1310 08/05/20 1612 08/05/20 1931 08/06/20 0250  HGB 15.7  --   --  15.3  HCT 48.8  --   --  48.4  PLT 166  --   --  145*  LABPROT 13.6  --   --   --   INR 1.0  --   --   --   HEPARINUNFRC  --   --  0.18* 0.30  CREATININE 1.03  --   --   --   TROPONINIHS 16 13  --   --      Estimated Creatinine Clearance: 138.5 mL/min (by C-G formula based on SCr of 1.03 mg/dL).   Medical History: Past Medical History:  Diagnosis Date   Anxiety    Asthma    Back pain    DDD (degenerative disc disease), lumbar    DDD (degenerative disc disease), lumbar    Difficult intubation    GERD (gastroesophageal reflux disease)    High cholesterol    Kidney calculi    Kidney stones    OSA (obstructive sleep apnea)     Medications:  Infusions:   heparin 2,100 Units/hr (08/05/20 2026)    Assessment: 39 yom presented to the ED with leg pain and SOB. Found to have a DVT and multiple bilateral PE's with saddle PE and right heart strain. To start IV heparin. Baseline CBC is WNL and he is not on anticoagulation PTA.   08/06/2020  Heparin level = 0.3 therapeutic on heparin gtt @ 2100 units/hr (low end of goal) Hgb = 15.3  Pltc = 145K No bleeding or complications/interruptions with infusion reported by RN  Goal of Therapy:  Heparin level 0.3-0.7 units/ml Monitor platelets by anticoagulation protocol: Yes   Plan:  Increase Heparin gtt to 2150 units/hr to keep therapeutic Recheck a 6 hr heparin level after rate increase to ensure  therapeutic dose Daily heparin level and CBC  Terrilee Files, PharmD 08/06/2020,3:26 AM

## 2020-08-06 NOTE — Progress Notes (Signed)
This  RN educated the patient on benefits from both Covid and pneumonia vaccines per his risk factors and history. All questions answered at this time. Patient is still deciding if he would like to receive at this time. RN will pass along in report for later administration.

## 2020-08-06 NOTE — Progress Notes (Signed)
  Echocardiogram 2D Echocardiogram has been performed.  Frank Robinson 08/06/2020, 9:50 AM

## 2020-08-06 NOTE — Progress Notes (Signed)
PROGRESS NOTE    Tod PersiaCharles Pasquarello  EAV:[id number] DOB: 10/14/1980 DOA: 08/05/2020 PCP: Maye HidesMiller, Ryan Dean, PA (Confirm with patient/family/NH records and if not entered, this HAS to be [facility] at [facility] point of entry. "No PCP" if truly none.)   Chief Complaint  Patient presents with   Shortness of Breath    Brief Narrative: Patient 40 year old gentleman history of COVID-19 diagnosed 09/29/2019 leading to respiratory failure requiring home O2 on discharge which is slowly improving, obesity, ongoing tobacco use, GERD presenting with worsening left lower extremity pain and swelling as well as worsening shortness of breath on exertion over the past month.  Work-up in the ED showed extensive left lower extremity DVT as well as multiple bilateral pulmonary emboli including a saddle embolus.  Patient admitted placed on heparin.  PCCM consulted.   Assessment & Plan:   Principal Problem:   Acute saddle pulmonary embolus (HCC) Active Problems:   Pulmonary embolism (HCC)   Acute deep vein thrombosis (DVT) of left lower extremity (HCC)   Asthma   DDD (degenerative disc disease), lumbar   GERD (gastroesophageal reflux disease)   Hyperlipidemia   OSA (obstructive sleep apnea)   Tobacco abuse   1 bilateral PE/saddle embolism/left lower extremity DVT -Patient denies any recent long car rides/travel, no recent surgeries, no family history of DVT, no prior history of DVT. -Patient diagnosed with COVID-19 September 2019 as well as obesity with likely decreased mobility, likely etiology of patient's bilateral PE and lower extremity DVT. -2D echo done this morning with results pending to rule out right ventricular strain. -Continue IV heparin for 72 hours subsequently transition to oral anticoagulation.  -Patient will likely need anywhere from 3 to 6 months of anticoagulation. -When anticoagulation has been completed may benefit from hypercoagulable panel. -Due to extensive bilateral PE/saddle embolism  patient at increased risk for acute decompensation PCCM consulted and are following.   2.  Gastroesophageal reflux disease -PPI daily.   -GI cocktail as needed.    3.  Tobacco abuse -Tobacco cessation -Patient with minimal wheezing noted on examination.  We will give a dose of Solu-Medrol 60 mg IV x1. -Place on scheduled duo nebs, Dulera. -Nicotine patch.  4.  OSA -Recently diagnosed per patient states uses 4 L nasal cannula O2 at bedtime while awaiting CPAP machine. -Continue CPAP nightly.       DVT prophylaxis: Heparin Code Status: Full Family Communication: Updated patient.  No family at bedside. Disposition:   Status is: Inpatient  Remains inpatient appropriate because:IV treatments appropriate due to intensity of illness or inability to take PO  Dispo: The patient is from: Home              Anticipated d/c is to: Home              Patient currently is not medically stable to d/c.   Difficult to place patient No       Consultants:  PCCM: Dr. Judeth HornHunsucker 08/05/2020  Procedures:  CT angiogram chest 08/05/2020 Lower extremity Doppler 08/05/2020 2D echo 08/06/2020 pending   Antimicrobials:  None   Subjective: Sleeping heavily on CPAP.  Noted to only have slept a few hours last night.  Feels shortness of breath is getting better.  Feels left lower extremity less tight than was on admission.  Denies any chest pain.  No abdominal pain.  No bleeding.  Objective: Vitals:   08/06/20 0800 08/06/20 0900 08/06/20 0915 08/06/20 0930  BP: (!) 106/44     Pulse: 74 70 78  75  Resp: (!) 24 18 17 17   Temp: 97.6 F (36.4 C)     TempSrc: Oral     SpO2: 97% (!) 87% (!) 85% 99%  Weight:      Height:        Intake/Output Summary (Last 24 hours) at 08/06/2020 1002 Last data filed at 08/06/2020 0900 Gross per 24 hour  Intake 378.41 ml  Output 750 ml  Net -371.59 ml   Filed Weights   08/05/20 1219 08/06/20 0500  Weight: (!) 137.9 kg 134.4 kg    Examination:  General exam:  Appears calm and comfortable  Respiratory system: Minimal expiratory wheezing.  No crackles.  Fair air movement.  No rhonchi.  Speaking in full sentences.  Cardiovascular system: S1 & S2 heard, RRR. No JVD, murmurs, rubs, gallops or clicks.  Trace to 1+ left lower extremity edema. Gastrointestinal system: Abdomen is nondistended, soft and nontender. No organomegaly or masses felt. Normal bowel sounds heard. Central nervous system: Alert and oriented. No focal neurological deficits. Extremities: Symmetric 5 x 5 power. Skin: No rashes, lesions or ulcers Psychiatry: Judgement and insight appear normal. Mood & affect appropriate.     Data Reviewed: I have personally reviewed following labs and imaging studies  CBC: Recent Labs  Lab 08/05/20 1310 08/06/20 0250  WBC 10.3 8.4  NEUTROABS 6.0  --   HGB 15.7 15.3  HCT 48.8 48.4  MCV 85.6 87.1  PLT 166 145*    Basic Metabolic Panel: Recent Labs  Lab 08/05/20 1310 08/06/20 0250  NA 135 138  K 3.8 4.3  CL 98 99  CO2 29 28  GLUCOSE 94 96  BUN 15 18  CREATININE 1.03 1.06  CALCIUM 9.0 9.2  MG  --  2.1  PHOS  --  5.3*    GFR: Estimated Creatinine Clearance: 132.7 mL/min (by C-G formula based on SCr of 1.06 mg/dL).  Liver Function Tests: Recent Labs  Lab 08/06/20 0250  AST 28  ALT 30  ALKPHOS 59  BILITOT 1.1  PROT 6.8  ALBUMIN 3.6    CBG: No results for input(s): GLUCAP in the last 168 hours.   Recent Results (from the past 240 hour(s))  Resp Panel by RT-PCR (Flu A&B, Covid) Nasopharyngeal Swab     Status: None   Collection Time: 08/05/20  1:20 PM   Specimen: Nasopharyngeal Swab; Nasopharyngeal(NP) swabs in vial transport medium  Result Value Ref Range Status   SARS Coronavirus 2 by RT PCR NEGATIVE NEGATIVE Final    Comment: (NOTE) SARS-CoV-2 target nucleic acids are NOT DETECTED.  The SARS-CoV-2 RNA is generally detectable in upper respiratory specimens during the acute phase of infection. The  lowest concentration of SARS-CoV-2 viral copies this assay can detect is 138 copies/mL. A negative result does not preclude SARS-Cov-2 infection and should not be used as the sole basis for treatment or other patient management decisions. A negative result may occur with  improper specimen collection/handling, submission of specimen other than nasopharyngeal swab, presence of viral mutation(s) within the areas targeted by this assay, and inadequate number of viral copies(<138 copies/mL). A negative result must be combined with clinical observations, patient history, and epidemiological information. The expected result is Negative.  Fact Sheet for Patients:  10/06/20  Fact Sheet for Healthcare Providers:  BloggerCourse.com  This test is no t yet approved or cleared by the [place] FDA and  has been authorized for detection and/or diagnosis of SARS-CoV-2 by FDA under an Emergency Use Authorization (EUA). This  EUA will remain  in effect (meaning this test can be used) for the duration of the COVID-19 declaration under Section 564(b)(1) of the Act, 21 U.S.C.section 360bbb-3(b)(1), unless the authorization is terminated  or revoked sooner.       Influenza A by PCR NEGATIVE NEGATIVE Final   Influenza B by PCR NEGATIVE NEGATIVE Final    Comment: (NOTE) The Xpert Xpress SARS-CoV-2/FLU/RSV plus assay is intended as an aid in the diagnosis of influenza from Nasopharyngeal swab specimens and should not be used as a sole basis for treatment. Nasal washings and aspirates are unacceptable for Xpert Xpress SARS-CoV-2/FLU/RSV testing.  Fact Sheet for Patients: BloggerCourse.com  Fact Sheet for Healthcare Providers: SeriousBroker.it  This test is not yet approved or cleared by the [place] FDA and has been authorized for detection and/or diagnosis of SARS-CoV-2 by FDA under  an Emergency Use Authorization (EUA). This EUA will remain in effect (meaning this test can be used) for the duration of the COVID-19 declaration under Section 564(b)(1) of the Act, 21 U.S.C. section 360bbb-3(b)(1), unless the authorization is terminated or revoked.  Performed at [facility], [address] [place], [place] [address]   MRSA Next Gen by PCR, Nasal     Status: None   Collection Time: 08/05/20  4:55 PM   Specimen: Nasal Mucosa; Nasal Swab  Result Value Ref Range Status   MRSA by PCR Next Gen NOT DETECTED NOT DETECTED Final    Comment: (NOTE) The GeneXpert MRSA Assay (FDA approved for NASAL specimens only), is one component of a comprehensive MRSA colonization surveillance program. It is not intended to diagnose MRSA infection nor to guide or monitor treatment for MRSA infections. Test performance is not FDA approved in patients less than 63 years old. Performed at [facility], [address], [place], [place] [address]          Radiology Studies: CT Angio Chest PE W and/or Wo Contrast  Result Date: 08/05/2020 CLINICAL DATA:  Chest pain, high clinical suspicion for pulmonary embolism, has acute deep venous thrombosis in LEFT lower extremity EXAM: CT ANGIOGRAPHY CHEST WITH CONTRAST TECHNIQUE: Multidetector CT imaging of the chest was performed using the standard protocol during bolus administration of intravenous contrast. Multiplanar CT image reconstructions and MIPs were obtained to evaluate the vascular anatomy. CONTRAST:  OMNIPAQUE IOHEXOL 350 MG/ML SOLN IV COMPARISON:  CT chest 05/01/2019 FINDINGS: Cardiovascular: Aorta normal caliber without aneurysm or dissection. Multiple filling defects throughout the pulmonary arterial system bilaterally consistent with pulmonary embolism. These include pulmonary emboli in all lobes bilaterally including a saddle embolus at the bifurcation of the RIGHT pulmonary artery into RIGHT upper lobe  and descending interlobar pulmonary artery as well as bifurcation of the RIGHT lower lobe pulmonary artery. (RV/LV Ratio = 1.14, abnormal/elevated. Heart otherwise unremarkable. No pericardial effusion. Mediastinum/Nodes: Esophagus unremarkable. Base of cervical region normal appearance. No thoracic adenopathy. Lungs/Pleura: 5 mm RIGHT lower lobe nodule image 55, larger than on previous exam when it measured 2-3 mm, uncertain if related to interval growth or differences in slice averaging. Lungs clear. No pulmonary infiltrate, pleural effusion, or pneumothorax. Upper Abdomen: No upper abdominal abnormalities identified Musculoskeletal: Osseous structures unremarkable Review of the MIP images confirms the above findings. IMPRESSION: Multiple BILATERAL pulmonary emboli including a saddle embolus at the bifurcation of the RIGHT pulmonary artery into RIGHT upper and descending interlobar pulmonary arteries as well as additional emboli in all pulmonary lobes bilaterally. Positive for acute PE with CT evidence  of right heart strain (RV/LV Ratio = 1.14) consistent with at least submassive (intermediate risk) PE. The presence of right heart strain has been associated with an increased risk of morbidity and mortality. Please refer to the "PE Focused" order set in EPIC. 5 mm RIGHT lower lobe pulmonary nodule, nonspecific, slightly larger than on the prior study from 2021; recommend follow-up imaging in 6 months to assess stability. Critical Value/emergent results were called by telephone at the time of interpretation on 08/05/2020 at 1:19 pm to provider Marianna Fuss MD, who verbally acknowledged these results. Electronically Signed   By: Ulyses Southward M.D.   On: 08/05/2020 13:21   US Venous Img Lower Unilateral Left  Result Date: 08/05/2020 CLINICAL DATA:  40 year old male with a history of left lower extremity swelling EXAM: LEFT LOWER EXTREMITY VENOUS DOPPLER ULTRASOUND TECHNIQUE: Gray-scale sonography with graded  compression, as well as color Doppler and duplex ultrasound were performed to evaluate the lower extremity deep venous systems from the level of the common femoral vein and including the common femoral, femoral, profunda femoral, popliteal and calf veins including the posterior tibial, peroneal and gastrocnemius veins when visible. The superficial great saphenous vein was also interrogated. Spectral Doppler was utilized to evaluate flow at rest and with distal augmentation maneuvers in the common femoral, femoral and popliteal veins. COMPARISON:  None. FINDINGS: Contralateral Common Femoral Vein: Respiratory phasicity is normal and symmetric with the symptomatic side. No evidence of thrombus. Normal compressibility. Common Femoral Vein: No evidence of thrombus. Normal compressibility, respiratory phasicity and response to augmentation. Saphenofemoral Junction: No evidence of thrombus. Normal compressibility and flow on color Doppler imaging. Profunda Femoral Vein: No evidence of thrombus. Normal compressibility and flow on color Doppler imaging. Femoral Vein: Proximal left femoral vein patent. Distal left femoral vein noncompressible with occlusive thrombus. Thrombus extends into the popliteal vein. Popliteal Vein: Noncompressible popliteal vein without flow. Calf Veins: Patent posterior tibial vein. Occlusive thrombus of the peroneal vein without compressibility. Superficial Great Saphenous Vein: No evidence of thrombus. Normal compressibility and flow on color Doppler imaging. Other Findings:  Gastrocs vein demonstrates occlusive thrombus. IMPRESSION: Positive sonographic survey of the left lower extremity demonstrating DVT of the distal femoral vein extending through the popliteal vein into the peroneal tibial vein and the intramuscular veins. These results were discussed by telephone at the time of interpretation on 08/05/2020 at 11:56 am with Dr. Stevie Kern Electronically Signed   By: Gilmer Mor D.O.   On:  08/05/2020 11:58        Scheduled Meds:  Chlorhexidine Gluconate Cloth  6 each Topical Daily   diclofenac Sodium  4 g Topical QID   melatonin  3 mg Oral QHS   nicotine  21 mg Transdermal Daily   pantoprazole  40 mg Oral Q0600   sodium chloride flush  3 mL Intravenous Q12H   Continuous Infusions:  heparin 2,150 Units/hr (08/06/20 0329)     LOS: 1 day    Time spent: 40 minutes    Ramiro Harvest, MD [facility]   To contact the attending provider between 7A-7P or the covering provider during after hours 7P-7A, please log into the web site [email or web address] and access using universal [place] password for that web site. If you do not have the password, please call the hospital operator.  08/06/2020, 10:02 AM

## 2020-08-06 NOTE — Progress Notes (Signed)
ANTICOAGULATION CONSULT NOTE - Follow Up Consult  Pharmacy Consult for heparin Indication: pulmonary embolus and DVT  Allergies  Allergen Reactions   Latex Rash   Patient Measurements: Height: 6' (182.9 cm) Weight: 134.4 kg (296 lb 4.8 oz) IBW/kg (Calculated) : 77.6 Heparin Dosing Weight: 109.3kg  Vital Signs: Temp: 97.6 F (36.4 C) (07/07 0800) Temp Source: Oral (07/07 0800) BP: 106/44 (07/07 0800) Pulse Rate: 75 (07/07 0930)  Labs: Recent Labs    08/05/20 1310 08/05/20 1612 08/05/20 1931 08/06/20 0250 08/06/20 0935  HGB 15.7  --   --  15.3  --   HCT 48.8  --   --  48.4  --   PLT 166  --   --  145*  --   LABPROT 13.6  --   --   --   --   INR 1.0  --   --   --   --   HEPARINUNFRC  --   --  0.18* 0.30 0.29*  CREATININE 1.03  --   --  1.06  --   TROPONINIHS 16 13  --   --   --     Estimated Creatinine Clearance: 132.7 mL/min (by C-G formula based on SCr of 1.06 mg/dL).  edical History: Past Medical History:  Diagnosis Date   Anxiety    Asthma    Back pain    DDD (degenerative disc disease), lumbar    DDD (degenerative disc disease), lumbar    Difficult intubation    GERD (gastroesophageal reflux disease)    High cholesterol    Kidney calculi    Kidney stones    OSA (obstructive sleep apnea)    Medications:  Infusions:   heparin      Assessment: 39 yom presented to the ED with leg pain and SOB. Found to have a DVT and multiple bilateral PE's with saddle PE and right heart strain. To start IV heparin. Baseline CBC is WNL and he is not on anticoagulation PTA.   08/06/2020  0300 Hep level 0.3, therapeutic on @ 2100 units/hr (low end of goal) Hgb = 15.3  Pltc = 145K No bleeding or complications/interruptions with infusion reported by RN Heparin rate empirically increased to 2150 units/hr 0930 Hep level 0.29 units/ml, below therapeutic range  Goal of Therapy:  Heparin level 0.3-0.7 units/ml Monitor platelets by anticoagulation protocol: Yes   Plan:   Rebolus Heparin again with 1500 units Increase Heparin rate to 2300 units/hr Check 6 hr Hep level Daily CBC, daily Hep level from 7/8  Perlie Gold L PharmD 08/06/2020, 10:32 AM

## 2020-08-06 NOTE — Progress Notes (Signed)
NAME:  Frank Robinson, MRN:  [id number], DOB:  [birth year], LOS: 1 ADMISSION DATE:  08/05/2020, CONSULTATION DATE: 08/06/20 REFERRING MD: Isaias Cowman, CHIEF COMPLAINT: Leg pain/swelling/dyspnea  History of Present Illness:  40 year old man whom we are seeing in consultation for evaluation of submassive PE.  ED note reviewed.  Patient is about 1 month ago noted left leg with swelling.  Over the course neck several days developed pain, discomfort.  Pain discomfort worsened intensified over the last few days.  Got the point last night worse throbbing, he was in tears.  Decided come to the ED for further evaluation.  Notes associated symptom of dyspnea on exertion.  Has baseline dyspnea dyspnea attributed to weight as well as COVID infection fall 2021.  However over the last couple of weeks, the dyspnea exertion has worsened significantly.  Worse on inclines or stairs.  Present on flat surfaces as well.  No cough.  No hemoptysis.  No presyncope or syncope.  Has been evaluated PCP for this.  Was supposed to have lower extremity Dopplers performed.  These had not been scheduled.  In the ED, borderline tachycardia low 100s.  BP normal.  Unclear if was hypoxemic on room air but placed on 2 L, sats mid to high 90s.  CTA PE protocol was performed which demonstrated bilateral pulmonary emboli with evidence of right heart strain.  Troponin x2, BNP within normal limits.  BMP, CBC stable and largely within normal limits.  This is compared to values in 07/20/2020 and 10/21/2019 (COVID hospitalization).  He denies any history of stroke or brain bleed.  No spinal or neurosurgical procedures.  No history of GI bleeding.  No recent surgeries.  No recent travel or long trips.  He does have relative immobility since COVID infection fall 2021.  Relatively sedentary.  He does get up and walk throughout the day.  Notes have to go outside to his car to smoke as he is not allowed to smoke in the home he currently resides.  Does  spend considerable amount of time in a recliner.  No family history of VTE.  Father recently diagnosed with cancer, he did not specify which type or origin of cancer.  Pertinent  Medical History  Obesity, degenerative disc disease  Significant Hospital Events: Including procedures, antibiotic start and stop dates in addition to other pertinent events   7/6 ED, PE, DVT, admitted for submassive PE at [facility], SDU 7/7 TTE with normal RV function, normal RA, RV size mildly increased, TR inadequate/ PA pressures ok  Interim History / Subjective:  Feels breathing less tight, leg less swollen and painful. TTE with normal RV function.  Objective   Blood pressure (!) 120/55, pulse 87, temperature 97.9 F (36.6 C), temperature source Oral, resp. rate 12, height 6' (1.829 m), weight 134.4 kg, SpO2 92 %.        Intake/Output Summary (Last 24 hours) at 08/06/2020 1741 Last data filed at 08/06/2020 1647 Gross per 24 hour  Intake 989.15 ml  Output 1000 ml  Net -10.85 ml   Filed Weights   08/05/20 1219 08/06/20 0500  Weight: (!) 137.9 kg 134.4 kg    Examination: General: sitting up, in NAD Eyes: EOMI, no icterus Neck: supple, no JVP appreciated Lungs: CTAB, NWOB, on RA, speaks in compete sentences Cardiovascular: RRR, no murmurs Abdomen: ND, BS present Extremities: L leg/ankle/calf appears larger than right, petehcial appearing skin discoloration red/brown on left leg calf down Neuro: no weakness, sensation intact Psych: Normal mood, full affect  Resolved Hospital Problem list     Assessment & Plan:  Acute submassive PE: R heart strain on CT, TTE ordered/pending. BNP, troponin negative. BP WNL. PESI score 69, low risk. Agree with heparin drip. No contraindication to tPa if needed in setting of decompensation. Unprovoked but query if relative immobilit afte COVID 10/2019 is to blame, hard to prove. Risk factor is obesity.  --heparin gtt, concert to DOAC per primary --TTE with normal RV  function, RV dilation likely related to chronic OSA --will arrange OP pulm f/u  OSA: AHI ~60 per description of report. Can not view. CPAP on back order so has not received machine. --CPAP at night  We will sign off.  Best Practice (right click and "Reselect all SmartList Selections" daily)   Per primary  Labs - personally reviewed  CBC: Recent Labs  Lab 08/05/20 1310 08/06/20 0250  WBC 10.3 8.4  NEUTROABS 6.0  --   HGB 15.7 15.3  HCT 48.8 48.4  MCV 85.6 87.1  PLT 166 145*     Basic Metabolic Panel: Recent Labs  Lab 08/05/20 1310 08/06/20 0250  NA 135 138  K 3.8 4.3  CL 98 99  CO2 29 28  GLUCOSE 94 96  BUN 15 18  CREATININE 1.03 1.06  CALCIUM 9.0 9.2  MG  --  2.1  PHOS  --  5.3*    GFR: Estimated Creatinine Clearance: 132.7 mL/min (by C-G formula based on SCr of 1.06 mg/dL). Recent Labs  Lab 08/05/20 1310 08/06/20 0250  WBC 10.3 8.4     Liver Function Tests: Recent Labs  Lab 08/06/20 0250  AST 28  ALT 30  ALKPHOS 59  BILITOT 1.1  PROT 6.8  ALBUMIN 3.6   No results for input(s): LIPASE, AMYLASE in the last 168 hours. No results for input(s): AMMONIA in the last 168 hours.  ABG    Component Value Date/Time   TCO2 26 12/20/2010 0826      Coagulation Profile: Recent Labs  Lab 08/05/20 1310  INR 1.0     Cardiac Enzymes: No results for input(s): CKTOTAL, CKMB, CKMBINDEX, TROPONINI in the last 168 hours.  HbA1C: No results found for: HGBA1C  CBG: No results for input(s): GLUCAP in the last 168 hours.  Review of Systems:   No orthopnea or PND. No cough or fever. No syncope or pre-syncope.  Comprehensive review of systems otherwise negative.  Past Medical History:  He,  has a past medical history of Anxiety, Asthma, Back pain, DDD (degenerative disc disease), lumbar, DDD (degenerative disc disease), lumbar, Difficult intubation, GERD (gastroesophageal reflux disease), High cholesterol, Kidney calculi, Kidney stones, and OSA  (obstructive sleep apnea).   Surgical History:   Past Surgical History:  Procedure Laterality Date   CYSTOSCOPY W/ URETERAL STENT PLACEMENT  12/18/2010   Procedure: CYSTOSCOPY WITH RETROGRADE PYELOGRAM/URETERAL STENT PLACEMENT;  Surgeon: Lindaann Slough, MD;  Location: [facility] ORS;  Service: Urology;  Laterality: Left;  left ureteroscopy   LITHOTRIPSY       Social History:   reports that he has been smoking cigarettes. He has been smoking an average of 1.00 packs per day. He has never used smokeless tobacco. He reports current alcohol use. He reports current drug use.   Family History:  His family history is not on file.   Allergies Allergies  Allergen Reactions   Latex Rash     Home Medications  Prior to Admission medications   Medication Sig Start Date End Date Taking? Authorizing Provider  acetaminophen (TYLENOL)  500 MG tablet Take 500 mg by mouth every 6 (six) hours as needed for moderate pain or headache.    [provider]  Cimetidine (HEARTBURN RELIEF PO) Take 1 tablet by mouth daily as needed (heartburn).    [provider]  diclofenac Sodium (VOLTAREN) 1 % GEL Apply 4 g topically 4 (four) times daily. 08/05/20   Palumbo, April, MD  lidocaine (LIDODERM) 5 % Place 1 patch onto the skin daily. Remove & Discard patch within 12 hours or as directed by MD 08/05/20   Nicanor Alcon, April, MD     Critical care time: n/a

## 2020-08-06 NOTE — Progress Notes (Signed)
OT Cancellation Note  Patient Details Name: [name] MRN: [id number] DOB: Mar 13, 1980   Cancelled Treatment:    Reason Eval/Treat Not Completed: Patient not medically ready. Per therapy protocol - IV heparin must be given 24 hours prior to therapy evaluation. Initial Heparin dose given at 0329. Will f/u tomorrow as able.   Taylor Levick L Jolly Bleicher 08/06/2020, 7:40 AM

## 2020-08-07 DIAGNOSIS — R911 Solitary pulmonary nodule: Secondary | ICD-10-CM

## 2020-08-07 LAB — CBC
HCT: 48.3 % (ref 39.0–52.0)
Hemoglobin: 14.9 g/dL (ref 13.0–17.0)
MCH: 27.4 pg (ref 26.0–34.0)
MCHC: 30.8 g/dL (ref 30.0–36.0)
MCV: 88.8 fL (ref 80.0–100.0)
Platelets: 187 10*3/uL (ref 150–400)
RBC: 5.44 MIL/uL (ref 4.22–5.81)
RDW: 15.5 % (ref 11.5–15.5)
WBC: 9.6 10*3/uL (ref 4.0–10.5)
nRBC: 0 % (ref 0.0–0.2)

## 2020-08-07 LAB — RENAL FUNCTION PANEL
Albumin: 3.7 g/dL (ref 3.5–5.0)
Anion gap: 10 (ref 5–15)
BUN: 17 mg/dL (ref 6–20)
CO2: 26 mmol/L (ref 22–32)
Calcium: 9.4 mg/dL (ref 8.9–10.3)
Chloride: 100 mmol/L (ref 98–111)
Creatinine, Ser: 0.93 mg/dL (ref 0.61–1.24)
GFR, Estimated: >60 mL/min (ref >60)
Glucose, Bld: 165 mg/dL — ABNORMAL HIGH (ref 70–99)
Phosphorus: 4.2 mg/dL (ref 2.5–4.6)
Potassium: 4.3 mmol/L (ref 3.5–5.1)
Sodium: 136 mmol/L (ref 135–145)

## 2020-08-07 LAB — URINE CULTURE: Culture: <10000 — AB

## 2020-08-07 LAB — HEPARIN LEVEL (UNFRACTIONATED): Heparin Unfractionated: 0.36 IU/mL (ref 0.30–0.70)

## 2020-08-07 NOTE — Evaluation (Signed)
Occupational Therapy Evaluation Patient Details Name: Frank Robinson MRN: [id number] DOB: 13-Nov-1980 Today's Date: 08/07/2020    History of Present Illness Patient 40 year old gentleman history of COVID-19 diagnosed 09/29/2019 leading to respiratory failure requiring home O2 on discharge which is slowly improving, obesity, ongoing tobacco use, GERD presenting with worsening left lower extremity pain and swelling as well as worsening shortness of breath on exertion over the past month.  Work-up in the ED showed extensive left lower extremity DVT as well as multiple bilateral pulmonary emboli including a saddle embolus.   Clinical Impression   Frank Robinson is a 40 year old man who presents with above medical history. ON evaluation patient exhibits normal strength, independence with ADLs and ambulation. Ambulated around ICU twice to monitor patient's oxygen sats - increased time and difficulty due to o2 sat probe signal and poor pleth. Initially patient found on 3 L [place] and maintained above 92% on RA. With ambulation short distance on RA- patient dropped to 85% and o2 increased to 2-3 L to get above 92%. However, it was determined poor signal may have effected numbers so new probe placed on left ring finger. O2 titrated down and patient hovering between 88-90% on RA with ambulation. Patient has no OT needs but therapist recommends Pulmonary Rehab as ongoing oxygen needs and poor endurance since 2021 COVID infection is patient's most pertinent needs.     Follow Up Recommendations  No OT follow up    Equipment Recommendations  None recommended by OT    Recommendations for Other Services Other (comment) (Pulmonary Rehab)     Precautions / Restrictions Precautions Precautions: Other (comment) Precaution Comments: monitor o2 Restrictions Weight Bearing Restrictions: No      Mobility Bed Mobility Overal bed mobility: Independent                  Transfers Overall transfer  level: Independent               General transfer comment: Ambulated around ICU twice as therapist monitored o2 saturation - increased time and distance due to difficulty with o2 sat probe and pleth signal.    Balance Overall balance assessment: Independent                                         ADL either performed or assessed with clinical judgement   ADL Overall ADL's : At baseline                                             Vision Patient Visual Report: No change from baseline       Perception     Praxis      Pertinent Vitals/Pain Pain Assessment: No/denies pain     Hand Dominance Right   Extremity/Trunk Assessment Upper Extremity Assessment Upper Extremity Assessment: Overall WFL for tasks assessed   Lower Extremity Assessment Lower Extremity Assessment: Overall WFL for tasks assessed   Cervical / Trunk Assessment Cervical / Trunk Assessment: Normal   Communication Communication Communication: No difficulties   Cognition Arousal/Alertness: Awake/alert Behavior During Therapy: WFL for tasks assessed/performed Overall Cognitive Status: Within Functional Limits for tasks assessed  General Comments       Exercises     Shoulder Instructions      Home Living Family/patient expects to be discharged to:: Private residence Living Arrangements: Spouse/significant other Available Help at Discharge: Family;Available PRN/intermittently Type of Home: House Home Access: Level entry     Home Layout: One level                   Additional Comments: oxygen      Prior Functioning/Environment Level of Independence: Independent                 OT Problem List: Cardiopulmonary status limiting activity      OT Treatment/Interventions:      OT Goals(Current goals can be found in the care plan section) Acute Rehab OT Goals OT Goal Formulation: All  assessment and education complete, DC therapy  OT Frequency:     Barriers to D/C:            Co-evaluation PT/OT/SLP Co-Evaluation/Treatment: Yes Reason for Co-Treatment: For patient/therapist safety PT goals addressed during session: Mobility/safety with mobility OT goals addressed during session: ADL's and self-care      AM-PAC OT "6 Clicks" Daily Activity     Outcome Measure Help from another person eating meals?: None Help from another person taking care of personal grooming?: None Help from another person toileting, which includes using toliet, bedpan, or urinal?: None Help from another person bathing (including washing, rinsing, drying)?: None Help from another person to put on and taking off regular upper body clothing?: None Help from another person to put on and taking off regular lower body clothing?: None 6 Click Score: 24   End of Session Equipment Utilized During Treatment: Oxygen Nurse Communication: Mobility status  Activity Tolerance: Patient tolerated treatment well Patient left: in chair;with call bell/phone within reach;with family/visitor present  OT Visit Diagnosis: Muscle weakness (generalized) (M62.81)                Time: 8299-3716 OT Time Calculation (min): 24 min Charges:  OT General Charges $OT Visit: 1 Visit OT Evaluation $OT Eval Low Complexity: 1 Low  Jordyn Hofacker, OTR/L Acute Care Rehab Services  Office [phone] Pager: [phone]   Kelli Churn 08/07/2020, 9:53 AM

## 2020-08-07 NOTE — Progress Notes (Addendum)
ANTICOAGULATION CONSULT NOTE - Follow Up Consult  Pharmacy Consult for heparin Indication: pulmonary embolus and DVT  Allergies  Allergen Reactions   Latex Rash   Patient Measurements: Height: 6' (182.9 cm) Weight: 133 kg (293 lb 3.4 oz) IBW/kg (Calculated) : 77.6 Heparin Dosing Weight: 109.3kg  Vital Signs: Temp: 98.2 F (36.8 C) (07/08 0800) Temp Source: Oral (07/08 0800) BP: 150/87 (07/08 0845) Pulse Rate: 85 (07/08 0845)  Labs: Recent Labs    08/05/20 1310 08/05/20 1612 08/05/20 1931 08/06/20 0250 08/06/20 0935 08/06/20 1720 08/07/20 0013  HGB 15.7  --   --  15.3  --   --  14.9  HCT 48.8  --   --  48.4  --   --  48.3  PLT 166  --   --  145*  --   --  187  LABPROT 13.6  --   --   --   --   --   --   INR 1.0  --   --   --   --   --   --   HEPARINUNFRC  --   --    < > 0.30 0.29* 0.38 0.36  CREATININE 1.03  --   --  1.06  --   --  0.93  TROPONINIHS 16 13  --   --   --   --   --    < > = values in this interval not displayed.    Estimated Creatinine Clearance: 150.5 mL/min (by C-G formula based on SCr of 0.93 mg/dL).  Medications:  Infusions:   heparin 2,300 Units/hr (08/07/20 0300)   Assessment: 39 yom presented to the ED with leg pain and SOB. Found to have a DVT and multiple bilateral PE's with saddle PE and right heart strain. To start IV heparin. Baseline CBC is WNL and he is not on anticoagulation PTA.   08/07/2020: Heparin remains therapeutic this AM on 2300 units/hr, but consistently in lower end of therapeutic range (0.3-0.4 units/mL) CBC WNL, stable No bleeding or complications/interruptions with infusion reported by RN Remains hemodynamically stable, back to baseline O2 (prior COVID, on 4L qHS)  Goal of Therapy:  Heparin level 0.3-0.7 units/ml Monitor platelets by anticoagulation protocol: Yes   Plan:  Increase heparin gtt to 2600 units/hr - does not seem like high bleed risk and would prefer levels in 0.5-0.7 range if able given severity of  PE Monitor daily heparin level, CBC, s/s bleeding TRH planning transition to Eliquis tomorrow - recommend full 7-day high dose period (10 mg bid) given extent of clot, heparin levels consistently low-normal, and low bleed risk  Bernadene Person, PharmD, BCPS [phone] 08/07/2020, 10:57 AM

## 2020-08-07 NOTE — Evaluation (Addendum)
Physical Therapy Evaluation Patient Details Name: Frank Robinson MRN: [id number] DOB: [birth year] Today's Date: 08/07/2020   History of Present Illness  Patient 40 year old gentleman history of COVID-19 diagnosed 09/29/2019 leading to respiratory failure requiring home O2 on discharge which is slowly improving, obesity, ongoing tobacco use, GERD presenting with worsening left lower extremity pain and swelling as well as worsening shortness of breath on exertion over the past month.  Work-up in the ED showed extensive left lower extremity DVT as well as multiple bilateral pulmonary emboli including a saddle embolus.  Clinical Impression  Mr. Frank Robinson is a 40 year old man who presents with above medical history. ON evaluation patient exhibits normal strength, independence with ADLs and ambulation. Ambulated around ICU twice to monitor patient's oxygen sats - increased time and difficulty due to o2 sat probe signal and poor pleth. Initially patient found on 3 L [place] and maintained above 92% on RA. With ambulation short distance on RA- patient dropped to 85% and o2 increased to 2-3 L to get above 92%. However, it was determined poor signal may have effected numbers so new probe placed on left ring finger. O2 titrated down and patient hovering between 88-90% on RA with ambulation. Patient has no PT needs but therapist recommends Pulmonary Rehab as ongoing oxygen needs and poor endurance since 2021 COVID infection is patient's most pertinent needs. PT service will sign off at this time.    Follow Up Recommendations Other (comment) (Pt could benefit from pulmonary rehab)    Equipment Recommendations  None recommended by PT    Recommendations for Other Services       Precautions / Restrictions Precautions Precautions: Other (comment) Precaution Comments: monitor o2 Restrictions Weight Bearing Restrictions: No      Mobility  Bed Mobility Overal bed mobility: Independent                   Transfers Overall transfer level: Modified independent               General transfer comment: Ambulated around ICU twice as therapist monitored o2 saturation - increased time and distance due to difficulty with o2 sat probe and pleth signal.  Ambulation/Gait Ambulation/Gait assistance: Min guard;Supervision;Independent Gait Distance (Feet): 860 Feet Assistive device: None Gait Pattern/deviations: Step-through pattern;WFL(Within Functional Limits) Gait velocity: mod pace   General Gait Details: Good stability, no LOB  Stairs            Wheelchair Mobility    Modified Rankin (Stroke Patients Only)       Balance Overall balance assessment: Independent                                           Pertinent Vitals/Pain Pain Assessment: No/denies pain    Home Living Family/patient expects to be discharged to:: Private residence Living Arrangements: Spouse/significant other Available Help at Discharge: Family;Available PRN/intermittently Type of Home: House Home Access: Level entry     Home Layout: One level   Additional Comments: oxygen    Prior Function Level of Independence: Independent               Hand Dominance   Dominant Hand: Right    Extremity/Trunk Assessment   Upper Extremity Assessment Upper Extremity Assessment: Overall WFL for tasks assessed    Lower Extremity Assessment Lower Extremity Assessment: Overall WFL for tasks assessed    Cervical / Trunk  Assessment Cervical / Trunk Assessment: Normal  Communication   Communication: No difficulties  Cognition Arousal/Alertness: Awake/alert Behavior During Therapy: WFL for tasks assessed/performed Overall Cognitive Status: Within Functional Limits for tasks assessed                                        General Comments      Exercises     Assessment/Plan    PT Assessment Patent does not need any further PT services  PT Problem List          PT Treatment Interventions Functional mobility training    PT Goals (Current goals can be found in the Care Plan section)  Acute Rehab PT Goals 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 PT Goal Formulation: All assessment and education complete, DC therapy    Frequency Min 1X/week   Barriers to discharge        Co-evaluation PT/OT/SLP Co-Evaluation/Treatment: Yes Reason for Co-Treatment: For patient/therapist safety PT goals addressed during session: Mobility/safety with mobility OT goals addressed during session: ADL's and self-care       AM-PAC PT "6 Clicks" Mobility  Outcome Measure Help needed turning from your back to your side while in a flat bed without using bedrails?: None Help needed moving from lying on your back to sitting on the side of a flat bed without using bedrails?: None Help needed moving to and from a bed to a chair (including a wheelchair)?: None Help needed standing up from a chair using your arms (e.g., wheelchair or bedside chair)?: None Help needed to walk in hospital room?: None Help needed climbing 3-5 steps with a railing? : A Little 6 Click Score: 23    End of Session Equipment Utilized During Treatment: Oxygen Activity Tolerance: Patient tolerated treatment well Patient left: in chair;with call bell/phone within reach Nurse Communication: Mobility status PT Visit Diagnosis: Difficulty in walking, not elsewhere classified (R26.2)    Time: 2952-8413 PT Time Calculation (min) (ACUTE ONLY): 25 min   Charges:   PT Evaluation $PT Eval Low Complexity: 1 Low          Frank Robinson PT Acute Rehabilitation Services Pager [phone] Office [phone]   Frank Robinson 08/07/2020, 12:47 PM

## 2020-08-07 NOTE — Progress Notes (Signed)
Tele called to inform RN that patient had 15 beat run of PVC's. He was up to the bathroom while this occurrence happened. Will continue to monitor. Notified on call provider.

## 2020-08-07 NOTE — Progress Notes (Signed)
PROGRESS NOTE    Frank Robinson  JIR:[id number] DOB: 15-Dec-1980 DOA: 08/05/2020 PCP: Maye Hides, PA (Confirm with patient/family/NH records and if not entered, this HAS to be entered at [facility] point of entry. "No PCP" if truly none.)   Chief Complaint  Patient presents with   Shortness of Breath    Brief Narrative: Patient 40 year old gentleman history of COVID-19 diagnosed 09/29/2019 leading to respiratory failure requiring home O2 on discharge which is slowly improving, obesity, ongoing tobacco use, GERD presenting with worsening left lower extremity pain and swelling as well as worsening shortness of breath on exertion over the past month.  Work-up in the ED showed extensive left lower extremity DVT as well as multiple bilateral pulmonary emboli including a saddle embolus.  Patient admitted placed on heparin.  PCCM consulted.   Assessment & Plan:   Principal Problem:   Acute saddle pulmonary embolus (HCC) Active Problems:   Pulmonary embolism (HCC)   Acute deep vein thrombosis (DVT) of left lower extremity (HCC)   Asthma   DDD (degenerative disc disease), lumbar   GERD (gastroesophageal reflux disease)   Hyperlipidemia   OSA (obstructive sleep apnea)   Tobacco abuse   Right lower lobe pulmonary nodule: 75mm per CT angiogram chest 08/05/2020   1 bilateral PE/saddle embolism/left lower extremity DVT -Patient denies any recent long car rides/travel, no recent surgeries, no family history of DVT, no prior history of DVT. -Patient diagnosed with COVID-19 September 2019 as well as obesity with likely decreased mobility, likely etiology of patient's bilateral PE and lower extremity DVT. -2D echo done with EF of 55 to 60%, NWMA, grade 2 diastolic dysfunction, normal right ventricular systolic function.  Right ventricular dilatation (likely secondary to OSA). -Continue IV heparin for 72 hours subsequently transition to oral anticoagulation, Eliquis hopefully tomorrow with loading dose and  subsequently maintenance dose..  -Patient will likely need anywhere from 3 to 6 months of anticoagulation. -When anticoagulation has been completed may benefit from hypercoagulable panel. -Due to extensive bilateral PE/saddle embolism patient at increased risk for acute decompensation PCCM consulted and followed and assessed the patient during the hospitalization and feel no further intervention needed at this time.   -Outpatient follow-up with pulmonary.   2.  Gastroesophageal reflux disease -Continue PPI daily.   -GI cocktail as needed.    3.  Tobacco abuse -Tobacco cessation -Patient with minimal wheezing noted on examination.  We will give a dose of Solu-Medrol 60 mg IV x1. -Place on scheduled duo nebs, Dulera. -Nicotine patch.  4.  OSA -Recently diagnosed per patient states uses 4 L nasal cannula O2 at bedtime while awaiting CPAP machine. -CPAP nightly.  5.  5 mm right lower lobe pulmonary nodule -Noted on CT angiogram chest. -We will need repeat imaging in 6 months. -Outpatient follow-up with pulmonary.     DVT prophylaxis: Heparin Code Status: Full Family Communication: Updated patient.  No family at bedside. Disposition:   Status is: Inpatient  Remains inpatient appropriate because:IV treatments appropriate due to intensity of illness or inability to take PO  Dispo: The patient is from: Home              Anticipated d/c is to: Home              Patient currently is not medically stable to d/c.   Difficult to place patient No       Consultants:  PCCM: Dr. Judeth Horn 08/05/2020  Procedures:  CT angiogram chest 08/05/2020 Lower extremity Doppler 08/05/2020 2D  echo 08/06/2020    Antimicrobials:  None   Subjective: Sitting up in recliner.  Stated ambulated twice around the unit with no significant shortness of breath.  States on ambulation left lower extremity did not have any significant pain.  Stated now that he is sitting some pain in the left lower extremity.   Tightness and swelling in left lower extremity improved.  Overall feeling much better than he did on admission.  Denies any bleeding.  Objective: Vitals:   08/07/20 0757 08/07/20 0758 08/07/20 0800 08/07/20 0845  BP:    (!) 150/87  Pulse:   69 85  Resp:   11 18  Temp:   98.2 F (36.8 C)   TempSrc:   Oral   SpO2: 99% 97% 100% 92%  Weight:      Height:        Intake/Output Summary (Last 24 hours) at 08/07/2020 0953 Last data filed at 08/07/2020 0800 Gross per 24 hour  Intake 1342.15 ml  Output 1840 ml  Net -497.85 ml   Filed Weights   08/05/20 1219 08/06/20 0500 08/07/20 0500  Weight: (!) 137.9 kg 134.4 kg 133 kg    Examination:  General exam: NAD. Respiratory system: Lungs clear to auscultation bilaterally.  No wheezes, no crackles, no rhonchi.  Fair air movement.  Speaking in full sentences.  No use of accessory muscles of respiration.  Cardiovascular system: Regular rate rhythm no murmurs rubs or gallops.  No JVD.  Trace to 1+ left lower extremity edema.  Gastrointestinal system: Abdomen is soft, nontender, nondistended, positive bowel sounds.  No rebound.  No guarding.   Central nervous system: Alert and oriented.  Moving extremities spontaneously.  No focal neurological deficits.  Extremities: Symmetric 5 x 5 power. Skin: No rashes, lesions or ulcers Psychiatry: Judgement and insight appear normal. Mood & affect appropriate.     Data Reviewed: I have personally reviewed following labs and imaging studies  CBC: Recent Labs  Lab 08/05/20 1310 08/06/20 0250 08/07/20 0013  WBC 10.3 8.4 9.6  NEUTROABS 6.0  --   --   HGB 15.7 15.3 14.9  HCT 48.8 48.4 48.3  MCV 85.6 87.1 88.8  PLT 166 145* 187    Basic Metabolic Panel: Recent Labs  Lab 08/05/20 1310 08/06/20 0250 08/07/20 0013  NA 135 138 136  K 3.8 4.3 4.3  CL 98 99 100  CO2 GLUCOSE 94 96 165*  BUN CREATININE 1.03 1.06 0.93  CALCIUM 9.0 9.2 9.4  MG  --  2.1  --   PHOS  --  5.3* 4.2     GFR: Estimated Creatinine Clearance: 150.5 mL/min (by C-G formula based on SCr of 0.93 mg/dL).  Liver Function Tests: Recent Labs  Lab 08/06/20 0250 08/07/20 0013  AST 28  --   ALT 30  --   ALKPHOS 59  --   BILITOT 1.1  --   PROT 6.8  --   ALBUMIN 3.6 3.7    CBG: No results for input(s): GLUCAP in the last 168 hours.   Recent Results (from the past 240 hour(s))  Resp Panel by RT-PCR (Flu A&B, Covid) Nasopharyngeal Swab     Status: None   Collection Time: 08/05/20  1:20 PM   Specimen: Nasopharyngeal Swab; Nasopharyngeal(NP) swabs in vial transport medium  Result Value Ref Range Status   SARS Coronavirus 2 by RT PCR NEGATIVE NEGATIVE Final    Comment: (NOTE) SARS-CoV-2 target nucleic acids are NOT  DETECTED.  The SARS-CoV-2 RNA is generally detectable in upper respiratory specimens during the acute phase of infection. The lowest concentration of SARS-CoV-2 viral copies this assay can detect is 138 copies/mL. A negative result does not preclude SARS-Cov-2 infection and should not be used as the sole basis for treatment or other patient management decisions. A negative result may occur with  improper specimen collection/handling, submission of specimen other than nasopharyngeal swab, presence of viral mutation(s) within the areas targeted by this assay, and inadequate number of viral copies(<138 copies/mL). A negative result must be combined with clinical observations, patient history, and epidemiological information. The expected result is Negative.  Fact Sheet for Patients:  BloggerCourse.com  Fact Sheet for Healthcare Providers:  SeriousBroker.it  This test is no t yet approved or cleared by the [place] FDA and  has been authorized for detection and/or diagnosis of SARS-CoV-2 by FDA under an Emergency Use Authorization (EUA). This EUA will remain  in effect (meaning this test can be used) for the duration of  the COVID-19 declaration under Section 564(b)(1) of the Act, 21 U.S.C.section 360bbb-3(b)(1), unless the authorization is terminated  or revoked sooner.       Influenza A by PCR NEGATIVE NEGATIVE Final   Influenza B by PCR NEGATIVE NEGATIVE Final    Comment: (NOTE) The Xpert Xpress SARS-CoV-2/FLU/RSV plus assay is intended as an aid in the diagnosis of influenza from Nasopharyngeal swab specimens and should not be used as a sole basis for treatment. Nasal washings and aspirates are unacceptable for Xpert Xpress SARS-CoV-2/FLU/RSV testing.  Fact Sheet for Patients: BloggerCourse.com  Fact Sheet for Healthcare Providers: SeriousBroker.it  This test is not yet approved or cleared by the [place] FDA and has been authorized for detection and/or diagnosis of SARS-CoV-2 by FDA under an Emergency Use Authorization (EUA). This EUA will remain in effect (meaning this test can be used) for the duration of the COVID-19 declaration under Section 564(b)(1) of the Act, 21 U.S.C. section 360bbb-3(b)(1), unless the authorization is terminated or revoked.  Performed at [facility], [address] [place], [place] [address]   MRSA Next Gen by PCR, Nasal     Status: None   Collection Time: 08/05/20  4:55 PM   Specimen: Nasal Mucosa; Nasal Swab  Result Value Ref Range Status   MRSA by PCR Next Gen NOT DETECTED NOT DETECTED Final    Comment: (NOTE) The GeneXpert MRSA Assay (FDA approved for NASAL specimens only), is one component of a comprehensive MRSA colonization surveillance program. It is not intended to diagnose MRSA infection nor to guide or monitor treatment for MRSA infections. Test performance is not FDA approved in patients less than 36 years old. Performed at [facility], [address] [place], [place] [address]   Urine culture     Status: Abnormal   Collection Time: 08/06/20  5:32 AM    Specimen: Urine, Clean Catch  Result Value Ref Range Status   Specimen Description   Final    URINE, CLEAN CATCH Performed at [facility], [address] [place], [place] [address]    Special Requests   Final    NONE Performed at [facility], [address], [place], [place] [address]    Culture (A)  Final    <10,000 COLONIES/mL INSIGNIFICANT GROWTH Performed at [facility], [address] [place], [place] [address]    Report Status 08/07/2020 FINAL  Final  Radiology Studies: CT Angio Chest PE W and/or Wo Contrast  Result Date: 08/05/2020 CLINICAL DATA:  Chest pain, high clinical suspicion for pulmonary embolism, has acute deep venous thrombosis in LEFT lower extremity EXAM: CT ANGIOGRAPHY CHEST WITH CONTRAST TECHNIQUE: Multidetector CT imaging of the chest was performed using the standard protocol during bolus administration of intravenous contrast. Multiplanar CT image reconstructions and MIPs were obtained to evaluate the vascular anatomy. CONTRAST:  OMNIPAQUE IOHEXOL 350 MG/ML SOLN IV COMPARISON:  CT chest 05/01/2019 FINDINGS: Cardiovascular: Aorta normal caliber without aneurysm or dissection. Multiple filling defects throughout the pulmonary arterial system bilaterally consistent with pulmonary embolism. These include pulmonary emboli in all lobes bilaterally including a saddle embolus at the bifurcation of the RIGHT pulmonary artery into RIGHT upper lobe and descending interlobar pulmonary artery as well as bifurcation of the RIGHT lower lobe pulmonary artery. (RV/LV Ratio = 1.14, abnormal/elevated. Heart otherwise unremarkable. No pericardial effusion. Mediastinum/Nodes: Esophagus unremarkable. Base of cervical region normal appearance. No thoracic adenopathy. Lungs/Pleura: 5 mm RIGHT lower lobe nodule image 55, larger than on previous exam when it measured 2-3 mm, uncertain if related to interval growth or differences in  slice averaging. Lungs clear. No pulmonary infiltrate, pleural effusion, or pneumothorax. Upper Abdomen: No upper abdominal abnormalities identified Musculoskeletal: Osseous structures unremarkable Review of the MIP images confirms the above findings. IMPRESSION: Multiple BILATERAL pulmonary emboli including a saddle embolus at the bifurcation of the RIGHT pulmonary artery into RIGHT upper and descending interlobar pulmonary arteries as well as additional emboli in all pulmonary lobes bilaterally. Positive for acute PE with CT evidence of right heart strain (RV/LV Ratio = 1.14) consistent with at least submassive (intermediate risk) PE. The presence of right heart strain has been associated with an increased risk of morbidity and mortality. Please refer to the "PE Focused" order set in EPIC. 5 mm RIGHT lower lobe pulmonary nodule, nonspecific, slightly larger than on the prior study from 2021; recommend follow-up imaging in 6 months to assess stability. Critical Value/emergent results were called by telephone at the time of interpretation on 08/05/2020 at 1:19 pm to provider Marianna Fuss MD, who verbally acknowledged these results. Electronically Signed   By: Ulyses Southward M.D.   On: 08/05/2020 13:21   US Venous Img Lower Unilateral Left  Result Date: 08/05/2020 CLINICAL DATA:  40 year old male with a history of left lower extremity swelling EXAM: LEFT LOWER EXTREMITY VENOUS DOPPLER ULTRASOUND TECHNIQUE: Gray-scale sonography with graded compression, as well as color Doppler and duplex ultrasound were performed to evaluate the lower extremity deep venous systems from the level of the common femoral vein and including the common femoral, femoral, profunda femoral, popliteal and calf veins including the posterior tibial, peroneal and gastrocnemius veins when visible. The superficial great saphenous vein was also interrogated. Spectral Doppler was utilized to evaluate flow at rest and with distal augmentation  maneuvers in the common femoral, femoral and popliteal veins. COMPARISON:  None. FINDINGS: Contralateral Common Femoral Vein: Respiratory phasicity is normal and symmetric with the symptomatic side. No evidence of thrombus. Normal compressibility. Common Femoral Vein: No evidence of thrombus. Normal compressibility, respiratory phasicity and response to augmentation. Saphenofemoral Junction: No evidence of thrombus. Normal compressibility and flow on color Doppler imaging. Profunda Femoral Vein: No evidence of thrombus. Normal compressibility and flow on color Doppler imaging. Femoral Vein: Proximal left femoral vein patent. Distal left femoral vein noncompressible with occlusive thrombus. Thrombus extends into the popliteal vein. Popliteal Vein: Noncompressible popliteal vein without flow. Calf Veins: Patent posterior  tibial vein. Occlusive thrombus of the peroneal vein without compressibility. Superficial Great Saphenous Vein: No evidence of thrombus. Normal compressibility and flow on color Doppler imaging. Other Findings:  Gastrocs vein demonstrates occlusive thrombus. IMPRESSION: Positive sonographic survey of the left lower extremity demonstrating DVT of the distal femoral vein extending through the popliteal vein into the peroneal tibial vein and the intramuscular veins. These results were discussed by telephone at the time of interpretation on 08/05/2020 at 11:56 am with Dr. Stevie Kernykstra Electronically Signed   By: Gilmer MorJaime  Wagner D.O.   On: 08/05/2020 11:58   ECHOCARDIOGRAM COMPLETE  Result Date: 08/06/2020    ECHOCARDIOGRAM REPORT   Patient Name:   Tod PersiaCHARLES Bartley Date of Exam: 08/06/2020 Medical Rec #:  [id number]         Height:       72.0 in Accession #:    [id number]        Weight:       296.3 lb Date of Birth:  02/22/1980          BSA:          2.518 m Patient Age:    39 years          BP:           106/44 mmHg Patient Gender: M                 HR:           67 bpm. Exam Location:  Inpatient Procedure: 2D  Echo, Cardiac Doppler, Color Doppler and Intracardiac            Opacification Agent Indications:    Pulmonary Embolus I26.09  History:        Patient has no prior history of Echocardiogram examinations.                 Risk Factors:Dyslipidemia and Current Smoker. GERD.  Sonographer:    Renella CunasJulia Swaim RDCS Referring Phys: [id number] Rodolph BongANIEL V Gerrit Rafalski  Sonographer Comments: Image acquisition challenging due to patient body habitus. IMPRESSIONS  1. Left ventricular ejection fraction, by estimation, is 55 to 60%. The left ventricle has normal function. The left ventricle has no regional wall motion abnormalities. Left ventricular diastolic parameters are consistent with Grade II diastolic dysfunction (pseudonormalization).  2. Right ventricular systolic function is normal. The right ventricular size is mildly enlarged. Tricuspid regurgitation signal is inadequate for assessing PA pressure.  3. The mitral valve is normal in structure. No evidence of mitral valve regurgitation. No evidence of mitral stenosis.  4. The aortic valve is tricuspid. Aortic valve regurgitation is not visualized. No aortic stenosis is present.  5. The inferior vena cava is normal in size with greater than 50% respiratory variability, suggesting right atrial pressure of 3 mmHg. FINDINGS  Left Ventricle: Left ventricular ejection fraction, by estimation, is 55 to 60%. The left ventricle has normal function. The left ventricle has no regional wall motion abnormalities. Definity contrast agent was given IV to delineate the left ventricular  endocardial borders. The left ventricular internal cavity size was normal in size. There is no left ventricular hypertrophy. Left ventricular diastolic parameters are consistent with Grade II diastolic dysfunction (pseudonormalization). Right Ventricle: The right ventricular size is mildly enlarged. No increase in right ventricular wall thickness. Right ventricular systolic function is normal. Tricuspid regurgitation  signal is inadequate for assessing PA pressure. Left Atrium: Left atrial size was normal in size. Right Atrium: Right atrial size was normal in size. Pericardium: There is  no evidence of pericardial effusion. Mitral Valve: The mitral valve is normal in structure. No evidence of mitral valve regurgitation. No evidence of mitral valve stenosis. Tricuspid Valve: The tricuspid valve is normal in structure. Tricuspid valve regurgitation is trivial. Aortic Valve: The aortic valve is tricuspid. Aortic valve regurgitation is not visualized. No aortic stenosis is present. Pulmonic Valve: The pulmonic valve was normal in structure. Pulmonic valve regurgitation is not visualized. Aorta: The aortic root is normal in size and structure. Venous: The inferior vena cava is normal in size with greater than 50% respiratory variability, suggesting right atrial pressure of 3 mmHg. IAS/Shunts: No atrial level shunt detected by color flow Doppler.  LEFT VENTRICLE PLAX 2D LVIDd:         5.40 cm      Diastology LVIDs:         3.70 cm      LV e' medial:    6.15 cm/s LV PW:         0.90 cm      LV E/e' medial:  11.8 LV IVS:        0.90 cm      LV e' lateral:   9.23 cm/s LVOT diam:     2.40 cm      LV E/e' lateral: 7.9 LV SV:         69 LV SV Index:   27 LVOT Area:     4.52 cm  LV Volumes (MOD) LV vol d, MOD A4C: 103.0 ml LV vol s, MOD A4C: 38.5 ml LV SV MOD A4C:     103.0 ml RIGHT VENTRICLE RV S prime:     11.00 cm/s TAPSE (M-mode): 2.4 cm LEFT ATRIUM             Index       RIGHT ATRIUM           Index LA diam:        3.60 cm 1.43 cm/m  RA Area:     13.00 cm LA Vol (A2C):   36.3 ml 14.42 ml/m RA Volume:   29.40 ml  11.68 ml/m LA Vol (A4C):   38.5 ml 15.29 ml/m LA Biplane Vol: 39.6 ml 15.73 ml/m  AORTIC VALVE LVOT Vmax:   65.90 cm/s LVOT Vmean:  52.400 cm/s LVOT VTI:    0.152 m  AORTA Ao Root diam: 3.50 cm MITRAL VALVE MV Area (PHT): 3.11 cm    SHUNTS MV Decel Time: 244 msec    Systemic VTI:  0.15 m MV E velocity: 72.80 cm/s   Systemic Diam: 2.40 cm MV A velocity: 56.60 cm/s MV E/A ratio:  1.29 Marca Ancona MD Electronically signed by Marca Ancona MD Signature Date/Time: 08/06/2020/2:25:06 PM    Final         Scheduled Meds:  Chlorhexidine Gluconate Cloth  6 each Topical Daily   diclofenac Sodium  4 g Topical QID   ipratropium  0.5 mg Nebulization TID   levalbuterol  1.25 mg Nebulization TID   melatonin  3 mg Oral QHS   mometasone-formoterol  2 puff Inhalation BID   nicotine  21 mg Transdermal Daily   pantoprazole  40 mg Oral Q0600   sodium chloride flush  3 mL Intravenous Q12H   Continuous Infusions:  heparin 2,300 Units/hr (08/07/20 0300)     LOS: 2 days    Time spent: 40 minutes    Ramiro Harvest, MD [facility]   To contact the attending provider between 7A-7P or the  covering provider during after hours 7P-7A, please log into the web site [email or web address] and access using universal  password for that web site. If you do not have the password, please call the hospital operator.  08/07/2020, 9:53 AM

## 2020-08-07 NOTE — Progress Notes (Signed)
ANTICOAGULATION CONSULT NOTE - Follow Up Consult  Pharmacy Consult for heparin Indication: pulmonary embolus and DVT  Allergies  Allergen Reactions   Latex Rash   Patient Measurements: Height: 6' (182.9 cm) Weight: 134.4 kg (296 lb 4.8 oz) IBW/kg (Calculated) : 77.6 Heparin Dosing Weight: 109.3kg  Vital Signs: Temp: 97.6 F (36.4 C) (07/07 2319) Temp Source: Oral (07/07 2319) BP: 127/77 (07/07 1800) Pulse Rate: 91 (07/07 1800)  Labs: Recent Labs    08/05/20 1310 08/05/20 1612 08/05/20 1931 08/06/20 0250 08/06/20 0935 08/06/20 1720 08/07/20 0013  HGB 15.7  --   --  15.3  --   --  14.9  HCT 48.8  --   --  48.4  --   --  48.3  PLT 166  --   --  145*  --   --  187  LABPROT 13.6  --   --   --   --   --   --   INR 1.0  --   --   --   --   --   --   HEPARINUNFRC  --   --    < > 0.30 0.29* 0.38 0.36  CREATININE 1.03  --   --  1.06  --   --  0.93  TROPONINIHS 16 13  --   --   --   --   --    < > = values in this interval not displayed.    Estimated Creatinine Clearance: 151.3 mL/min (by C-G formula based on SCr of 0.93 mg/dL).  edical History: Past Medical History:  Diagnosis Date   Anxiety    Asthma    Back pain    DDD (degenerative disc disease), lumbar    DDD (degenerative disc disease), lumbar    Difficult intubation    GERD (gastroesophageal reflux disease)    High cholesterol    Kidney calculi    Kidney stones    OSA (obstructive sleep apnea)    Medications:  Infusions:   heparin 2,300 Units/hr (08/06/20 2215)    Assessment: 39 yom presented to the ED with leg pain and SOB. Found to have a DVT and multiple bilateral PE's with saddle PE and right heart strain. To start IV heparin. Baseline CBC is WNL and he is not on anticoagulation PTA.   08/07/2020: Heparin level 0.36, therapeutic on heparin infusion at 2300 units/hr CBC WNL No bleeding or complications/interruptions with infusion reported by RN  Goal of Therapy:  Heparin level 0.3-0.7  units/ml Monitor platelets by anticoagulation protocol: Yes   Plan:  Continue heparin infusion at 2300 units/hr Monitor daily heparin level, CBC, s/s bleeding F/U plans to transition to DOAC  Junita Push, PharmD, BCPS 08/07/2020 1:27 AM

## 2020-08-08 LAB — BASIC METABOLIC PANEL
Anion gap: 8 (ref 5–15)
BUN: 21 mg/dL — ABNORMAL HIGH (ref 6–20)
CO2: 31 mmol/L (ref 22–32)
Calcium: 9.4 mg/dL (ref 8.9–10.3)
Chloride: 101 mmol/L (ref 98–111)
Creatinine, Ser: 1.15 mg/dL (ref 0.61–1.24)
GFR, Estimated: >60 mL/min (ref >60)
Glucose, Bld: 103 mg/dL — ABNORMAL HIGH (ref 70–99)
Potassium: 4.2 mmol/L (ref 3.5–5.1)
Sodium: 140 mmol/L (ref 135–145)

## 2020-08-08 LAB — CBC
HCT: 47.3 % (ref 39.0–52.0)
Hemoglobin: 14.3 g/dL (ref 13.0–17.0)
MCH: 27.1 pg (ref 26.0–34.0)
MCHC: 30.2 g/dL (ref 30.0–36.0)
MCV: 89.8 fL (ref 80.0–100.0)
Platelets: 222 10*3/uL (ref 150–400)
RBC: 5.27 MIL/uL (ref 4.22–5.81)
RDW: 16.2 % — ABNORMAL HIGH (ref 11.5–15.5)
WBC: 9.6 10*3/uL (ref 4.0–10.5)
nRBC: 0 % (ref 0.0–0.2)

## 2020-08-08 LAB — HEPARIN LEVEL (UNFRACTIONATED): Heparin Unfractionated: 0.51 IU/mL (ref 0.30–0.70)

## 2020-08-08 LAB — MAGNESIUM: Magnesium: 2.1 mg/dL (ref 1.7–2.4)

## 2020-08-08 MED ORDER — APIXABAN 5 MG PO TABS
10.0000 mg | ORAL_TABLET | Freq: Two times a day (BID) | ORAL | Status: DC
  Administered 2020-08-08 – 2020-08-10 (×4): 10 mg via ORAL
  Filled 2020-08-08 (×4): qty 2

## 2020-08-08 MED ORDER — APIXABAN 5 MG PO TABS: 5.0000 mg | ORAL_TABLET | Freq: Two times a day (BID) | ORAL | Status: DC

## 2020-08-08 NOTE — Progress Notes (Signed)
ANTICOAGULATION CONSULT NOTE - Follow Up Consult  Pharmacy Consult for heparin Indication: pulmonary embolus and DVT  Allergies  Allergen Reactions   Latex Rash   Patient Measurements: Height: 6' (182.9 cm) Weight: 133 kg (293 lb 3.4 oz) IBW/kg (Calculated) : 77.6 Heparin Dosing Weight: 109.3kg  Vital Signs: Temp: 97.8 F (36.6 C) (07/09 0448) Temp Source: Oral (07/09 0448) BP: 129/79 (07/09 0448) Pulse Rate: 69 (07/09 0448)  Labs: Recent Labs    08/05/20 1310 08/05/20 1612 08/05/20 1931 08/06/20 0250 08/06/20 0935 08/06/20 1720 08/07/20 0013 08/08/20 0454  HGB 15.7  --   --  15.3  --   --  14.9 14.3  HCT 48.8  --   --  48.4  --   --  48.3 47.3  PLT 166  --   --  145*  --   --  187 222  LABPROT 13.6  --   --   --   --   --   --   --   INR 1.0  --   --   --   --   --   --   --   HEPARINUNFRC  --   --    < > 0.30   < > 0.38 0.36 0.51  CREATININE 1.03  --   --  1.06  --   --  0.93 1.15  TROPONINIHS 16 13  --   --   --   --   --   --    < > = values in this interval not displayed.    Estimated Creatinine Clearance: 121.7 mL/min (by C-G formula based on SCr of 1.15 mg/dL).  Medications:  Infusions:   heparin 2,600 Units/hr (08/08/20 7741)   Assessment: 39 yom presented to the ED with leg pain and SOB. Found to have a DVT and multiple bilateral PE's with saddle PE and right heart strain. To start IV heparin. Baseline CBC is WNL and he is not on anticoagulation PTA.   08/08/2020: Heparin level 0.51 remains therapeutic on Heparin 2600 units/hr CBC: Hgb and Plt WNL, stable No bleeding or complications/interruptions with infusion reported by RN SCr up to 1.15  Goal of Therapy:  Heparin level 0.3-0.7 units/ml Monitor platelets by anticoagulation protocol: Yes   Plan:  Continue heparin gtt to 2600 units/hr  Monitor daily heparin level, CBC, s/s bleeding TRH planning transition to Eliquis - recommend full 7-day high dose period (10 mg bid) given extent of clot,  heparin levels consistently low-normal, and low bleed risk  Lynann Beaver PharmD, BCPS Clinical Pharmacist [facility] pharmacy [phone] 08/08/2020 8:54 AM

## 2020-08-08 NOTE — Discharge Instructions (Signed)
Information on my medicine - ELIQUIS (apixaban) Why was Eliquis prescribed for you? Eliquis was prescribed to treat blood clots that may have been found in the veins of your legs (deep vein thrombosis) or in your lungs (pulmonary embolism) and to reduce the risk of them occurring again.  What do You need to know about Eliquis ? The starting dose is 10 mg (two 5 mg tablets) taken TWICE daily for the FIRST SEVEN (7) DAYS, then on 08/16/20  the dose is reduced to ONE 5 mg tablet taken TWICE daily.  Eliquis may be taken with or without food.   Try to take the dose about the same time in the morning and in the evening. If you have difficulty swallowing the tablet whole please discuss with your pharmacist how to take the medication safely.  Take Eliquis exactly as prescribed and DO NOT stop taking Eliquis without talking to the doctor who prescribed the medication.  Stopping may increase your risk of developing a new blood clot.  Refill your prescription before you run out.  After discharge, you should have regular check-up appointments with your healthcare provider that is prescribing your Eliquis.    What do you do if you miss a dose? If a dose of ELIQUIS is not taken at the scheduled time, take it as soon as possible on the same day and twice-daily administration should be resumed. The dose should not be doubled to make up for a missed dose.  Important Safety Information A possible side effect of Eliquis is bleeding. You should call your healthcare provider right away if you experience any of the following: Bleeding from an injury or your nose that does not stop. Unusual colored urine (red or dark brown) or unusual colored stools (red or black). Unusual bruising for unknown reasons. A serious fall or if you hit your head (even if there is no bleeding).  Some medicines may interact with Eliquis and might increase your risk of bleeding or clotting while on Eliquis. To help avoid this,  consult your healthcare provider or pharmacist prior to using any new prescription or non-prescription medications, including herbals, vitamins, non-steroidal anti-inflammatory drugs (NSAIDs) and supplements.  This website has more information on Eliquis (apixaban): http://[email or web address].com/eliquis/home

## 2020-08-08 NOTE — Progress Notes (Signed)
PROGRESS NOTE    Frank PersiaCharles Siers  WUJ:[id number] DOB: 11/29/1980 DOA: 08/05/2020 PCP: Maye HidesMiller, Ryan Dean, PA   Chief Complaint  Patient presents with   Shortness of Breath    Brief Narrative: Patient 40 year old gentleman history of COVID-19 diagnosed 09/29/2019 leading to respiratory failure requiring home O2 on discharge which is slowly improving, obesity, ongoing tobacco use, GERD presenting with worsening left lower extremity pain and swelling as well as worsening shortness of breath on exertion over the past month.  Work-up in the ED showed extensive left lower extremity DVT as well as multiple bilateral pulmonary emboli including a saddle embolus.  Patient admitted placed on heparin.  PCCM consulted.   Assessment & Plan:   Principal Problem:   Acute saddle pulmonary embolus (HCC) Active Problems:   Pulmonary embolism (HCC)   Acute deep vein thrombosis (DVT) of left lower extremity (HCC)   Asthma   DDD (degenerative disc disease), lumbar   GERD (gastroesophageal reflux disease)   Hyperlipidemia   OSA (obstructive sleep apnea)   Tobacco abuse   Right lower lobe pulmonary nodule: 5mm per CT angiogram chest 08/05/2020   1 bilateral PE/saddle embolism/left lower extremity DVT -Patient denied any recent long car rides/travel, no recent surgeries, no family history of DVT, no prior history of DVT. -Patient diagnosed with COVID-19 September 2019 as well as obesity with likely decreased mobility, likely etiology of patient's bilateral PE and lower extremity DVT. -2D echo done with EF of 55 to 60%, NWMA, grade 2 diastolic dysfunction, normal right ventricular systolic function.  Right ventricular dilatation (likely secondary to OSA). -Patient tolerating IV heparin and likely transition to Eliquis this evening with loading dose and subsequent maintenance dose.  -Patient will likely need anywhere from 3 to 6 months of anticoagulation. -When anticoagulation has been completed may benefit from  hypercoagulable panel. -Due to extensive bilateral PE/saddle embolism patient at increased risk for acute decompensation PCCM consulted and followed and assessed the patient during the hospitalization and feel no further intervention needed at this time.   -Outpatient follow-up with pulmonary.   2.  Gastroesophageal reflux disease -PPI daily.   -GI cocktail as needed.    3.  Tobacco abuse -Tobacco cessation was stressed to patient.   -Wheezing improved after a dose of IV Solu-Medrol and scheduled duo nebs and Dulera.   -Nicotine patch.    4.  OSA -Recently diagnosed per patient states uses 4 L nasal cannula O2 at bedtime while awaiting CPAP machine. -CPAP nightly.  5.  5 mm right lower lobe pulmonary nodule -Noted on CT angiogram chest. -We will need repeat imaging in 6 months.   -Outpatient follow-up with pulmonary.      DVT prophylaxis: Heparin Code Status: Full Family Communication: Updated patient and wife at bedside.  Disposition:   Status is: Inpatient  Remains inpatient appropriate because:IV treatments appropriate due to intensity of illness or inability to take PO  Dispo: The patient is from: Home              Anticipated d/c is to: Home              Patient currently is not medically stable to d/c.   Difficult to place patient No       Consultants:  PCCM: Dr. Judeth HornHunsucker 08/05/2020  Procedures:  CT angiogram chest 08/05/2020 Lower extremity Doppler 08/05/2020 2D echo 08/06/2020    Antimicrobials:  None   Subjective: Sitting up in chair.  Feels shortness of breath is improving.  Left leg  tightness and swelling improving with no significant pain on ambulation but pain when sitting down.  No chest pain.  No abdominal pain.  Wife at bedside.    Objective: Vitals:   08/07/20 2319 08/07/20 2325 08/08/20 0448 08/08/20 0807  BP: 125/75  129/79   Pulse: 60  69   Resp: 20 20 20    Temp: 98.2 F (36.8 C)  97.8 F (36.6 C)   TempSrc: Oral  Oral   SpO2: 100%  98%  99%  Weight:      Height:        Intake/Output Summary (Last 24 hours) at 08/08/2020 1007 Last data filed at 08/08/2020 0448 Gross per 24 hour  Intake 232.77 ml  Output 1800 ml  Net -1567.23 ml    Filed Weights   08/05/20 1219 08/06/20 0500 08/07/20 0500  Weight: (!) 137.9 kg 134.4 kg 133 kg    Examination:  General exam: NAD Respiratory system: CTA B.  No wheezes, no crackles, no rhonchi.  Fair air movement.  Cardiovascular system: RRR no murmurs rubs or gallops.  No JVD.  Trace bilateral lower extremity edema.  Gastrointestinal system: Abdomen is soft, nontender, nondistended, positive bowel sounds.  No rebound.  No guarding.  Central nervous system: Alert and oriented.  No focal neurological deficits.  Extremities: Symmetric 5 x 5 power. Skin: No rashes, lesions or ulcers Psychiatry: Judgement and insight appear normal. Mood & affect appropriate.     Data Reviewed: I have personally reviewed following labs and imaging studies  CBC: Recent Labs  Lab 08/05/20 1310 08/06/20 0250 08/07/20 0013 08/08/20 0454  WBC 10.3 8.4 9.6 9.6  NEUTROABS 6.0  --   --   --   HGB 15.7 15.3 14.9 14.3  HCT 48.8 48.4 48.3 47.3  MCV 85.6 87.1 88.8 89.8  PLT 166 145* 187 222     Basic Metabolic Panel: Recent Labs  Lab 08/05/20 1310 08/06/20 0250 08/07/20 0013 08/08/20 0454  NA 135 138 136 140  K 3.8 4.3 4.3 4.2  CL 98 99 100 101  CO2 29 28 26 31   GLUCOSE 94 96 165* 103*  BUN 15 18 17  21*  CREATININE 1.03 1.06 0.93 1.15  CALCIUM 9.0 9.2 9.4 9.4  MG  --  2.1  --  2.1  PHOS  --  5.3* 4.2  --      GFR: Estimated Creatinine Clearance: 121.7 mL/min (by C-G formula based on SCr of 1.15 mg/dL).  Liver Function Tests: Recent Labs  Lab 08/06/20 0250 08/07/20 0013  AST 28  --   ALT 30  --   ALKPHOS 59  --   BILITOT 1.1  --   PROT 6.8  --   ALBUMIN 3.6 3.7     CBG: No results for input(s): GLUCAP in the last 168 hours.   Recent Results (from the past 240 hour(s))   Resp Panel by RT-PCR (Flu A&B, Covid) Nasopharyngeal Swab     Status: None   Collection Time: 08/05/20  1:20 PM   Specimen: Nasopharyngeal Swab; Nasopharyngeal(NP) swabs in vial transport medium  Result Value Ref Range Status   SARS Coronavirus 2 by RT PCR NEGATIVE NEGATIVE Final    Comment: (NOTE) SARS-CoV-2 target nucleic acids are NOT DETECTED.  The SARS-CoV-2 RNA is generally detectable in upper respiratory specimens during the acute phase of infection. The lowest concentration of SARS-CoV-2 viral copies this assay can detect is 138 copies/mL. A negative result does not preclude SARS-Cov-2 infection and should not be  used as the sole basis for treatment or other patient management decisions. A negative result may occur with  improper specimen collection/handling, submission of specimen other than nasopharyngeal swab, presence of viral mutation(s) within the areas targeted by this assay, and inadequate number of viral copies(<138 copies/mL). A negative result must be combined with clinical observations, patient history, and epidemiological information. The expected result is Negative.  Fact Sheet for Patients:  BloggerCourse.com  Fact Sheet for Healthcare Providers:  SeriousBroker.it  This test is no t yet approved or cleared by the [place] FDA and  has been authorized for detection and/or diagnosis of SARS-CoV-2 by FDA under an Emergency Use Authorization (EUA). This EUA will remain  in effect (meaning this test can be used) for the duration of the COVID-19 declaration under Section 564(b)(1) of the Act, 21 U.S.C.section 360bbb-3(b)(1), unless the authorization is terminated  or revoked sooner.       Influenza A by PCR NEGATIVE NEGATIVE Final   Influenza B by PCR NEGATIVE NEGATIVE Final    Comment: (NOTE) The Xpert Xpress SARS-CoV-2/FLU/RSV plus assay is intended as an aid in the diagnosis of influenza from  Nasopharyngeal swab specimens and should not be used as a sole basis for treatment. Nasal washings and aspirates are unacceptable for Xpert Xpress SARS-CoV-2/FLU/RSV testing.  Fact Sheet for Patients: BloggerCourse.com  Fact Sheet for Healthcare Providers: SeriousBroker.it  This test is not yet approved or cleared by the [place] FDA and has been authorized for detection and/or diagnosis of SARS-CoV-2 by FDA under an Emergency Use Authorization (EUA). This EUA will remain in effect (meaning this test can be used) for the duration of the COVID-19 declaration under Section 564(b)(1) of the Act, 21 U.S.C. section 360bbb-3(b)(1), unless the authorization is terminated or revoked.  Performed at [facility], [address] [place], [place] [address]   MRSA Next Gen by PCR, Nasal     Status: None   Collection Time: 08/05/20  4:55 PM   Specimen: Nasal Mucosa; Nasal Swab  Result Value Ref Range Status   MRSA by PCR Next Gen NOT DETECTED NOT DETECTED Final    Comment: (NOTE) The GeneXpert MRSA Assay (FDA approved for NASAL specimens only), is one component of a comprehensive MRSA colonization surveillance program. It is not intended to diagnose MRSA infection nor to guide or monitor treatment for MRSA infections. Test performance is not FDA approved in patients less than 60 years old. Performed at [facility], [address] [place], [place] [address]   Urine culture     Status: Abnormal   Collection Time: 08/06/20  5:32 AM   Specimen: Urine, Clean Catch  Result Value Ref Range Status   Specimen Description   Final    URINE, CLEAN CATCH Performed at [facility], [address], [place], [place] [address]    Special Requests   Final    NONE Performed at [facility], [address], [place], [place] [address]    Culture (A)  Final    <10,000 COLONIES/mL  INSIGNIFICANT GROWTH Performed at [facility], [address], [place], [place] [address]    Report Status 08/07/2020 FINAL  Final          Radiology Studies: No results found.      Scheduled Meds:  Chlorhexidine Gluconate Cloth  6 each Topical Daily   diclofenac Sodium  4 g Topical QID   melatonin  3 mg Oral QHS  mometasone-formoterol  2 puff Inhalation BID   nicotine  21 mg Transdermal Daily   pantoprazole  40 mg Oral Q0600   sodium chloride flush  3 mL Intravenous Q12H   Continuous Infusions:  heparin 2,600 Units/hr (08/08/20 4193)     LOS: 3 days    Time spent: 40 minutes    Ramiro Harvest, MD [facility]   To contact the attending provider between 7A-7P or the covering provider during after hours 7P-7A, please log into the web site [email or web address] and access using universal [place] password for that web site. If you do not have the password, please call the hospital operator.  08/08/2020, 10:07 AM

## 2020-08-08 NOTE — Progress Notes (Signed)
ANTICOAGULATION CONSULT NOTE - Follow Up Consult  Pharmacy Consult for Apixaban Indication: pulmonary embolus and DVT  Allergies  Allergen Reactions   Latex Rash   Patient Measurements: Height: 6' (182.9 cm) Weight: 133 kg (293 lb 3.4 oz) IBW/kg (Calculated) : 77.6 Heparin Dosing Weight: 109.3kg  Vital Signs: Temp: 97.8 F (36.6 C) (07/09 1309) Temp Source: Oral (07/09 1309) BP: 137/74 (07/09 1309) Pulse Rate: 64 (07/09 1309)  Labs: Recent Labs    08/05/20 1612 08/05/20 1931 08/06/20 0250 08/06/20 0935 08/06/20 1720 08/07/20 0013 08/08/20 0454  HGB  --    < > 15.3  --   --  14.9 14.3  HCT  --   --  48.4  --   --  48.3 47.3  PLT  --   --  145*  --   --  187 222  HEPARINUNFRC  --    < > 0.30   < > 0.38 0.36 0.51  CREATININE  --   --  1.06  --   --  0.93 1.15  TROPONINIHS 13  --   --   --   --   --   --    < > = values in this interval not displayed.    Estimated Creatinine Clearance: 121.7 mL/min (by C-G formula based on SCr of 1.15 mg/dL).  Medications:  Infusions:   heparin 2,600 Units/hr (08/08/20 8416)   Assessment: 39 yom presented to the ED with leg pain and SOB. Found to have a DVT and multiple bilateral PE's with saddle PE and right heart strain. Pharmacy is consulted to transition from Heparin to apixaban.    08/08/2020: Heparin level 0.51 remains therapeutic on Heparin 2600 units/hr CBC: Hgb and Plt WNL, stable No bleeding or complications/interruptions with infusion reported by RN SCr up to 1.15  Goal of Therapy:  Heparin level 0.3-0.7 units/ml Monitor platelets by anticoagulation protocol: Yes   Plan:  D/C Heparin drip at 22:00 At 22:00 begin apixaban 10 mg PO BID x 7 days, followed by apixaban 5 mg PO BID Monitor CBC, s/s bleeding Pharmacy to provide education and coupon prior to discharge.   Lynann Beaver PharmD, BCPS Clinical Pharmacist [facility] main pharmacy [phone] 08/08/2020 4:08 PM

## 2020-08-09 LAB — CBC
HCT: 46.7 % (ref 39.0–52.0)
Hemoglobin: 14.3 g/dL (ref 13.0–17.0)
MCH: 27.1 pg (ref 26.0–34.0)
MCHC: 30.6 g/dL (ref 30.0–36.0)
MCV: 88.6 fL (ref 80.0–100.0)
Platelets: 224 10*3/uL (ref 150–400)
RBC: 5.27 MIL/uL (ref 4.22–5.81)
RDW: 15.9 % — ABNORMAL HIGH (ref 11.5–15.5)
WBC: 8.6 10*3/uL (ref 4.0–10.5)
nRBC: 0 % (ref 0.0–0.2)

## 2020-08-09 LAB — BASIC METABOLIC PANEL
Anion gap: 8 (ref 5–15)
BUN: 18 mg/dL (ref 6–20)
CO2: 30 mmol/L (ref 22–32)
Calcium: 9.5 mg/dL (ref 8.9–10.3)
Chloride: 101 mmol/L (ref 98–111)
Creatinine, Ser: 1.15 mg/dL (ref 0.61–1.24)
GFR, Estimated: >60 mL/min (ref >60)
Glucose, Bld: 103 mg/dL — ABNORMAL HIGH (ref 70–99)
Potassium: 4.4 mmol/L (ref 3.5–5.1)
Sodium: 139 mmol/L (ref 135–145)

## 2020-08-09 LAB — HEPARIN LEVEL (UNFRACTIONATED): Heparin Unfractionated: >1.1 IU/mL — ABNORMAL HIGH (ref 0.30–0.70)

## 2020-08-09 MED ORDER — HYDROXYZINE HCL 50 MG/ML IM SOLN
25.0000 mg | Freq: Once | INTRAMUSCULAR | Status: AC
  Administered 2020-08-09: 25 mg via INTRAMUSCULAR
  Filled 2020-08-09: qty 0.5

## 2020-08-09 MED ORDER — HYDROXYZINE HCL 25 MG PO TABS
25.0000 mg | ORAL_TABLET | Freq: Three times a day (TID) | ORAL | Status: DC | PRN
  Administered 2020-08-09 (×2): 25 mg via ORAL
  Filled 2020-08-09 (×2): qty 1

## 2020-08-09 NOTE — Progress Notes (Signed)
Patient c/o anxiousness and not being able to relax and get to sleep. Patient was walked around the unit with Erie Noe, RN and is back in bed now. Wife at bedside. Messaged provide for orders. Will continue to monitor.

## 2020-08-09 NOTE — Progress Notes (Addendum)
PROGRESS NOTE    Frank Robinson  GYK:[id number] DOB: 1980-07-04 DOA: 08/05/2020 PCP: Maye Hides, PA   Chief Complaint  Patient presents with   Shortness of Breath    Brief Narrative: Patient 40 year old gentleman history of COVID-19 diagnosed 09/29/2019 leading to respiratory failure requiring home O2 on discharge which is slowly improving, obesity, ongoing tobacco use, GERD presenting with worsening left lower extremity pain and swelling as well as worsening shortness of breath on exertion over the past month.  Work-up in the ED showed extensive left lower extremity DVT as well as multiple bilateral pulmonary emboli including a saddle embolus.  Patient admitted placed on heparin.  PCCM consulted.   Assessment & Plan:   Principal Problem:   Acute saddle pulmonary embolus (HCC) Active Problems:   Pulmonary embolism (HCC)   Acute deep vein thrombosis (DVT) of left lower extremity (HCC)   Asthma   DDD (degenerative disc disease), lumbar   GERD (gastroesophageal reflux disease)   Hyperlipidemia   OSA (obstructive sleep apnea)   Tobacco abuse   Right lower lobe pulmonary nodule: 41mm per CT angiogram chest 08/05/2020   1 bilateral PE/saddle embolism/left lower extremity DVT -Patient denied any recent long car rides/travel, no recent surgeries, no family history of DVT, no prior history of DVT. -Patient diagnosed with COVID-19 September 2019 as well as obesity with likely decreased mobility, likely etiology of patient's bilateral PE and lower extremity DVT. -2D echo done with EF of 55 to 60%, NWMA, grade 2 diastolic dysfunction, normal right ventricular systolic function.  Right ventricular dilatation (likely secondary to OSA). -Patient was on IV heparin and subsequently transition to Eliquis yesterday evening.   -No bleeding. -Patient will likely need anywhere from 3 to 6 months of anticoagulation -Once anticoagulation has been completed may benefit from hypercoagulable  panel. -Check ambulatory sats. -Due to extensive bilateral PE/saddle embolism patient at increased risk for acute decompensation PCCM consulted and followed and assessed the patient during the hospitalization and feel no further intervention needed at this time.   -Outpatient follow-up with pulmonary.   2.  Gastroesophageal reflux disease -Continue PPI.   -GI cocktail as needed.   3.  Tobacco abuse -Tobacco cessation stressed to patient.   -Patient noted to have a bout of wheezing which resolved after IV Solu-Medrol and scheduled duo nebs and Dulera.   -Continue nicotine patch.    4.  OSA -Recently diagnosed per patient states uses 4 L nasal cannula O2 at bedtime while awaiting CPAP machine. -CPAP nightly.  5.  5 mm right lower lobe pulmonary nodule -Noted on CT angiogram chest. -We will need repeat imaging in 6 months.   -Outpatient follow-up with pulmonary.  6.  Anxiety -Patient noted to have an episode of anxiety overnight. -Place on hydroxyzine as needed.     DVT prophylaxis: Heparin Code Status: Full Family Communication: Updated patient and wife at bedside.  Disposition:   Status is: Inpatient  Remains inpatient appropriate because:IV treatments appropriate due to intensity of illness or inability to take PO  Dispo: The patient is from: Home              Anticipated d/c is to: Home              Patient currently is not medically stable to d/c.   Difficult to place patient No       Consultants:  PCCM: Dr. Judeth Horn 08/05/2020  Procedures:  CT angiogram chest 08/05/2020 Lower extremity Doppler 08/05/2020 2D echo 08/06/2020  Antimicrobials:  None   Subjective: Sitting up in bed eating his breakfast.  Denies chest pain.  Shortness of breath improving.  Still with some leg tightness on ambulation however improving.  No chest pain.  No abdominal pain.  Noted to have an episode of anxiety overnight.    Objective: Vitals:   08/08/20 2300 08/09/20 0508 08/09/20  0735 08/09/20 0737  BP:  128/71    Pulse:  60    Resp: 19     Temp:      TempSrc:      SpO2:  99% (!) 83% 95%  Weight:      Height:        Intake/Output Summary (Last 24 hours) at 08/09/2020 1007 Last data filed at 08/08/2020 1822 Gross per 24 hour  Intake 580 ml  Output 1500 ml  Net -920 ml    Filed Weights   08/05/20 1219 08/06/20 0500 08/07/20 0500  Weight: (!) 137.9 kg 134.4 kg 133 kg    Examination:  General exam: : NAD Respiratory system: CTAB.  No wheezes, no rhonchi.  Speaking in full sentences.  Normal respiratory effort. Cardiovascular system: Regular rate and rhythm no murmurs rubs or gallops.  No JVD.  No lower extremity edema.  Gastrointestinal system: Abdomen soft, nontender, nondistended, positive bowel sounds.  No rebound.  No guarding. Central nervous system: Alert and oriented. No focal neurological deficits. Extremities: Symmetric 5 x 5 power. Skin: No rashes, lesions or ulcers Psychiatry: Judgement and insight appear normal. Mood & affect appropriate.  Data Reviewed: I have personally reviewed following labs and imaging studies  CBC: Recent Labs  Lab 08/05/20 1310 08/06/20 0250 08/07/20 0013 08/08/20 0454 08/09/20 0512  WBC 10.3 8.4 9.6 9.6 8.6  NEUTROABS 6.0  --   --   --   --   HGB 15.7 15.3 14.9 14.3 14.3  HCT 48.8 48.4 48.3 47.3 46.7  MCV 85.6 87.1 88.8 89.8 88.6  PLT 166 145* 187 222 224     Basic Metabolic Panel: Recent Labs  Lab 08/05/20 1310 08/06/20 0250 08/07/20 0013 08/08/20 0454 08/09/20 0512  NA 135 138 136 140 139  K 3.8 4.3 4.3 4.2 4.4  CL 98 99 100 101 101  CO2 29 28 26 31 30   GLUCOSE 94 96 165* 103* 103*  BUN 15 18 17  21* 18  CREATININE 1.03 1.06 0.93 1.15 1.15  CALCIUM 9.0 9.2 9.4 9.4 9.5  MG  --  2.1  --  2.1  --   PHOS  --  5.3* 4.2  --   --      GFR: Estimated Creatinine Clearance: 121.7 mL/min (by C-G formula based on SCr of 1.15 mg/dL).  Liver Function Tests: Recent Labs  Lab 08/06/20 0250  08/07/20 0013  AST 28  --   ALT 30  --   ALKPHOS 59  --   BILITOT 1.1  --   PROT 6.8  --   ALBUMIN 3.6 3.7     CBG: No results for input(s): GLUCAP in the last 168 hours.   Recent Results (from the past 240 hour(s))  Resp Panel by RT-PCR (Flu A&B, Covid) Nasopharyngeal Swab     Status: None   Collection Time: 08/05/20  1:20 PM   Specimen: Nasopharyngeal Swab; Nasopharyngeal(NP) swabs in vial transport medium  Result Value Ref Range Status   SARS Coronavirus 2 by RT PCR NEGATIVE NEGATIVE Final    Comment: (NOTE) SARS-CoV-2 target nucleic acids are NOT DETECTED.  The SARS-CoV-2  RNA is generally detectable in upper respiratory specimens during the acute phase of infection. The lowest concentration of SARS-CoV-2 viral copies this assay can detect is 138 copies/mL. A negative result does not preclude SARS-Cov-2 infection and should not be used as the sole basis for treatment or other patient management decisions. A negative result may occur with  improper specimen collection/handling, submission of specimen other than nasopharyngeal swab, presence of viral mutation(s) within the areas targeted by this assay, and inadequate number of viral copies(<138 copies/mL). A negative result must be combined with clinical observations, patient history, and epidemiological information. The expected result is Negative.  Fact Sheet for Patients:  BloggerCourse.com  Fact Sheet for Healthcare Providers:  SeriousBroker.it  This test is no t yet approved or cleared by the [place] FDA and  has been authorized for detection and/or diagnosis of SARS-CoV-2 by FDA under an Emergency Use Authorization (EUA). This EUA will remain  in effect (meaning this test can be used) for the duration of the COVID-19 declaration under Section 564(b)(1) of the Act, 21 U.S.C.section 360bbb-3(b)(1), unless the authorization is terminated  or revoked sooner.        Influenza A by PCR NEGATIVE NEGATIVE Final   Influenza B by PCR NEGATIVE NEGATIVE Final    Comment: (NOTE) The Xpert Xpress SARS-CoV-2/FLU/RSV plus assay is intended as an aid in the diagnosis of influenza from Nasopharyngeal swab specimens and should not be used as a sole basis for treatment. Nasal washings and aspirates are unacceptable for Xpert Xpress SARS-CoV-2/FLU/RSV testing.  Fact Sheet for Patients: BloggerCourse.com  Fact Sheet for Healthcare Providers: SeriousBroker.it  This test is not yet approved or cleared by the [place] FDA and has been authorized for detection and/or diagnosis of SARS-CoV-2 by FDA under an Emergency Use Authorization (EUA). This EUA will remain in effect (meaning this test can be used) for the duration of the COVID-19 declaration under Section 564(b)(1) of the Act, 21 U.S.C. section 360bbb-3(b)(1), unless the authorization is terminated or revoked.  Performed at [facility], [address] [place], [place] [address]   MRSA Next Gen by PCR, Nasal     Status: None   Collection Time: 08/05/20  4:55 PM   Specimen: Nasal Mucosa; Nasal Swab  Result Value Ref Range Status   MRSA by PCR Next Gen NOT DETECTED NOT DETECTED Final    Comment: (NOTE) The GeneXpert MRSA Assay (FDA approved for NASAL specimens only), is one component of a comprehensive MRSA colonization surveillance program. It is not intended to diagnose MRSA infection nor to guide or monitor treatment for MRSA infections. Test performance is not FDA approved in patients less than 52 years old. Performed at [facility], [address] [place], [place] [address]   Urine culture     Status: Abnormal   Collection Time: 08/06/20  5:32 AM   Specimen: Urine, Clean Catch  Result Value Ref Range Status   Specimen Description   Final    URINE, CLEAN CATCH Performed at [facility], [address], [place], [place] [address]    Special Requests   Final    NONE Performed at [facility], [address], [place], [place] [address]    Culture (A)  Final    <10,000 COLONIES/mL INSIGNIFICANT GROWTH Performed at [facility], [address], [place], [place] [address]    Report Status 08/07/2020 FINAL  Final  Radiology Studies: No results found.      Scheduled Meds:  apixaban  10 mg Oral BID   Followed by   Melene Muller[START ON 08/16/2020] apixaban  5 mg Oral BID   Chlorhexidine Gluconate Cloth  6 each Topical Daily   diclofenac Sodium  4 g Topical QID   melatonin  3 mg Oral QHS   mometasone-formoterol  2 puff Inhalation BID   nicotine  21 mg Transdermal Daily   pantoprazole  40 mg Oral Q0600   sodium chloride flush  3 mL Intravenous Q12H   Continuous Infusions:     LOS: 4 days    Time spent: 35 minutes    Ramiro Harvestaniel Sudeep Scheibel, MD [facility]   To contact the attending provider between 7A-7P or the covering provider during after hours 7P-7A, please log into the web site [email or web address] and access using universal [place] password for that web site. If you do not have the password, please call the hospital operator.  08/09/2020, 10:07 AM

## 2020-08-10 ENCOUNTER — Encounter (HOSPITAL_BASED_OUTPATIENT_CLINIC_OR_DEPARTMENT_OTHER): Payer: Self-pay

## 2020-08-10 ENCOUNTER — Emergency Department (HOSPITAL_BASED_OUTPATIENT_CLINIC_OR_DEPARTMENT_OTHER)
Admission: EM | Admit: 2020-08-10 | Discharge: 2020-08-11 | Disposition: A | Discharge status: Home / Self Care | Payer: Medicaid Other | Attending: Emergency Medicine | Admitting: Emergency Medicine

## 2020-08-10 ENCOUNTER — Other Ambulatory Visit: Payer: Self-pay

## 2020-08-10 DIAGNOSIS — Z91040 Latex allergy status: Secondary | ICD-10-CM | POA: Insufficient documentation

## 2020-08-10 DIAGNOSIS — J45909 Unspecified asthma, uncomplicated: Secondary | ICD-10-CM | POA: Insufficient documentation

## 2020-08-10 DIAGNOSIS — M25552 Pain in left hip: Secondary | ICD-10-CM | POA: Insufficient documentation

## 2020-08-10 DIAGNOSIS — R0602 Shortness of breath: Secondary | ICD-10-CM | POA: Insufficient documentation

## 2020-08-10 DIAGNOSIS — R079 Chest pain, unspecified: Secondary | ICD-10-CM | POA: Diagnosis not present

## 2020-08-10 DIAGNOSIS — Z7951 Long term (current) use of inhaled steroids: Secondary | ICD-10-CM | POA: Insufficient documentation

## 2020-08-10 DIAGNOSIS — Z8616 Personal history of COVID-19: Secondary | ICD-10-CM | POA: Insufficient documentation

## 2020-08-10 DIAGNOSIS — Z7901 Long term (current) use of anticoagulants: Secondary | ICD-10-CM | POA: Insufficient documentation

## 2020-08-10 DIAGNOSIS — F17210 Nicotine dependence, cigarettes, uncomplicated: Secondary | ICD-10-CM | POA: Insufficient documentation

## 2020-08-10 LAB — BASIC METABOLIC PANEL
Anion gap: 9 (ref 5–15)
BUN: 17 mg/dL (ref 6–20)
CO2: 30 mmol/L (ref 22–32)
Calcium: 9.3 mg/dL (ref 8.9–10.3)
Chloride: 99 mmol/L (ref 98–111)
Creatinine, Ser: 1.24 mg/dL (ref 0.61–1.24)
GFR, Estimated: >60 mL/min (ref >60)
Glucose, Bld: 103 mg/dL — ABNORMAL HIGH (ref 70–99)
Potassium: 4.6 mmol/L (ref 3.5–5.1)
Sodium: 138 mmol/L (ref 135–145)

## 2020-08-10 LAB — CBC
HCT: 47.7 % (ref 39.0–52.0)
Hemoglobin: 14.9 g/dL (ref 13.0–17.0)
MCH: 27.7 pg (ref 26.0–34.0)
MCHC: 31.2 g/dL (ref 30.0–36.0)
MCV: 88.7 fL (ref 80.0–100.0)
Platelets: 219 10*3/uL (ref 150–400)
RBC: 5.38 MIL/uL (ref 4.22–5.81)
RDW: 15.6 % — ABNORMAL HIGH (ref 11.5–15.5)
WBC: 8.8 10*3/uL (ref 4.0–10.5)
nRBC: 0 % (ref 0.0–0.2)

## 2020-08-10 MED ORDER — ALBUTEROL SULFATE HFA 108 (90 BASE) MCG/ACT IN AERS: 2.0000 | INHALATION_SPRAY | Freq: Four times a day (QID) | RESPIRATORY_TRACT | 0 refills | Status: AC | PRN

## 2020-08-10 MED ORDER — ACETAMINOPHEN 325 MG PO TABS
650.0000 mg | ORAL_TABLET | Freq: Four times a day (QID) | ORAL | Status: AC | PRN
Start: 2020-08-10 — End: ?

## 2020-08-10 MED ORDER — APIXABAN 5 MG PO TABS: 5.0000 mg | ORAL_TABLET | Freq: Two times a day (BID) | ORAL | 1 refills | Status: AC

## 2020-08-10 MED ORDER — OMEPRAZOLE 40 MG PO CPDR: 40.0000 mg | DELAYED_RELEASE_CAPSULE | Freq: Every day | ORAL | 1 refills | Status: AC

## 2020-08-10 MED ORDER — TRELEGY ELLIPTA 100-62.5-25 MCG/INH IN AEPB
1.0000 | INHALATION_SPRAY | Freq: Every day | RESPIRATORY_TRACT | 0 refills | Status: AC
Start: 2020-08-10 — End: ?

## 2020-08-10 MED ORDER — NICOTINE 21 MG/24HR TD PT24
21.0000 mg | MEDICATED_PATCH | Freq: Every day | TRANSDERMAL | 0 refills | Status: AC
Start: 2020-08-11 — End: ?

## 2020-08-10 MED ORDER — APIXABAN (ELIQUIS) VTE STARTER PACK (10MG AND 5MG): ORAL_TABLET | ORAL | 0 refills | Status: DC

## 2020-08-10 MED ORDER — ATORVASTATIN CALCIUM 20 MG PO TABS: 20.0000 mg | ORAL_TABLET | Freq: Every day | ORAL | 1 refills | Status: AC

## 2020-08-10 MED ORDER — HYDROXYZINE HCL 25 MG PO TABS
25.0000 mg | ORAL_TABLET | Freq: Three times a day (TID) | ORAL | 0 refills | Status: AC | PRN
Start: 2020-08-10 — End: ?

## 2020-08-10 NOTE — TOC Transition Note (Signed)
Transition of Care [facility]) - CM/SW Discharge Note   Patient Details  Name: Frank Robinson MRN: [id number] Date of Birth: 23-Sep-1980  [facility] CM/SW Contact:  Lanier Clam, RN Phone Number: 08/10/2020, 11:41 AM   Clinical Narrative:  Already has home 02-No CM neds or orders.            Patient Goals and CMS Choice        Discharge Placement                       Discharge Plan and Services                                     Social Determinants of Health (SDOH) Interventions     Readmission Risk Interventions No flowsheet data found.

## 2020-08-10 NOTE — Discharge Summary (Signed)
Physician Discharge Summary  Frank Robinson MPN:[id number] DOB: 1980/10/03 DOA: 08/05/2020  PCP: Maye Hides, PA  Admit date: 08/05/2020 Discharge date: 08/10/2020  Time spent: 60 minutes  Recommendations for Outpatient Follow-up:  Follow-up with Maye Hides, PA in 2 weeks.  On follow-up patient need a basic metabolic profile done to follow-up on electrolytes and renal function.  Patient will need a CBC done to follow-up on H&H.  Patient sleep apnea need to be followed up upon.  Patient will need repeat CT scan of the chest to follow-up on pulmonary nodule in 6 months.  On follow-up patient's timing to return to work will need to be followed up upon and determined per PCP. Follow-up with Dr. Judeth Horn, pulmonary in 2 months for follow-up on saddle bilateral pulmonary emboli.  Pulmonary nodule noted on CT chest will also need to be followed up upon.   Discharge Diagnoses:  Principal Problem:   Acute saddle pulmonary embolus (HCC) Active Problems:   Pulmonary embolism (HCC)   Acute deep vein thrombosis (DVT) of left lower extremity (HCC)   Asthma   DDD (degenerative disc disease), lumbar   GERD (gastroesophageal reflux disease)   Hyperlipidemia   OSA (obstructive sleep apnea)   Tobacco abuse   Right lower lobe pulmonary nodule: 95mm per CT angiogram chest 08/05/2020   Discharge Condition: Stable and improved  Diet recommendation: Heart healthy  Filed Weights   08/05/20 1219 08/06/20 0500 08/07/20 0500  Weight: (!) 137.9 kg 134.4 kg 133 kg    History of present illness:  Frank Robinson is a 40 y.o. male with medical history significant of COVID-19 diagnosed 09/29/2019 that led to respiratory failure requiring home O2 on discharge which has slowly improved, ongoing tobacco use, obesity, gastroesophageal reflux disease presented to the ED with left lower extremity pain and swelling which have worsened over the past month with associated worsening shortness of breath which has  worsened to the point patient is short of breath on minimal exertion, chills, noted to be diaphoretic waking up in the morning upon all of selected patient, nausea which he related to GERD, generalized fatigue.  Patient denied any objective fevers, no emesis, no abdominal pain, no diarrhea, no constipation, no syncope, no melena, no hematemesis.  Patient did endorse occasional bleeding from hemorrhoids, occasional headaches.  Patient did endorse an episode of chest pain about a week ago and stated was assessed in the ED and diagnosed with reflux.  Patient with no other associated symptoms.  Patient denies any recent surgeries, no recent long car rides or travel, no prior history of DVT, no family history of DVT or PE.   ED Course: Patient seen in the ED lower extremity Dopplers done with DVT of the distal femoral vein extending through the popliteal vein into the peroneal tibial vein and intramuscular veins.  CT angiogram chest done with multiple bilateral pulmonary emboli including a saddle embolus at the bifurcation of the right pulmonary artery into the right upper and descending interlobar pulmonary arteries as well as additional emboli in all pulmonary lobes bilaterally.  Positive acute PE with CT evidence of right heart strain consistent with at least submassive PE.  5 mm right lower lobe pulmonary nodule, nonspecific, slightly larger than on prior study from 2021.  Basic metabolic profile done unremarkable.  Troponin of 16, 13.  CBC unremarkable.  INR 1.0.  Glucose of 94.  COVID-19 PCR negative.  Patient placed on IV heparin, hospitalist called for admission.    Hospital Course:  1  bilateral PE/saddle embolism/left lower extremity DVT -Patient denied any recent long car rides/travel, no recent surgeries, no family history of DVT, no prior history of DVT. -Patient diagnosed with COVID-19 September 2019 as well as obesity with likely decreased mobility, likely etiology of patient's bilateral PE and lower  extremity DVT. -2D echo done with EF of 55 to 60%, NWMA, grade 2 diastolic dysfunction, normal right ventricular systolic function.  Right ventricular dilatation (likely secondary to OSA). -Patient was on IV heparin and subsequently transitioned to Eliquis which he tolerated.  -Patient will likely need anywhere from 3 to 6 months of anticoagulation -Once anticoagulation has been completed may benefit from hypercoagulable panel. -Due to extensive bilateral PE/saddle embolism patient at increased risk for acute decompensation PCCM consulted and followed and assessed the patient during the hospitalization and feel no further intervention needed at this time.   -On day of discharge ambulatory sats obtained showed a sat of 95% on room air at rest, 96% on room air with ambulation. -Patient was discharged in stable and improved condition. -Outpatient follow-up with pulmonary.   2.  Gastroesophageal reflux disease -Patient maintained on a PPI as well as GI cocktail as needed during the hospitalization.   3.  Tobacco abuse -Tobacco cessation stressed to patient.   -Patient noted to have a bout of wheezing which resolved after IV Solu-Medrol and scheduled duo nebs and Dulera.   -Patient maintained on nicotine patch which will be discharged home on.   -Outpatient follow-up with PCP.    4.  OSA -Recently diagnosed per patient states uses 4 L nasal cannula O2 at bedtime while awaiting CPAP machine. -CPAP nightly during the hospitalization..  5.  5 mm right lower lobe pulmonary nodule -Noted on CT angiogram chest. -We will need repeat imaging in 6 months.   -Outpatient follow-up with pulmonary.  6.  Anxiety -Patient noted to have an episode of anxiety during the hospitalization and placed on hydroxyzine as needed.   -Patient will be discharged on a few pills of hydroxyzine as needed with close outpatient follow-up with PCP overnight.      Procedures: CT angiogram chest 08/05/2020 Lower extremity  Doppler 08/05/2020 2D echo 08/06/2020  Consultations: PCCM: Dr. Judeth Horn 08/05/2020    Discharge Exam: Vitals:   08/10/20 0605 08/10/20 0759  BP: (!) 145/96   Pulse: 62   Resp: 20   Temp: 97.9 F (36.6 C)   SpO2: 96% 93%    General: NAD Cardiovascular: RRR no murmurs rubs or gallops.  No JVD.  No lower extremity edema. Respiratory: CTA B.  No wheezes, no crackles, no rhonchi.  Normal respiratory effort.  Speaking in full sentences  Discharge Instructions   Discharge Instructions     Diet - low sodium heart healthy   Complete by: As directed    Discharge instructions   Complete by: As directed    No heavy lifting. Check pulse ox daily. No strenuous activity for the next month.   Increase activity slowly   Complete by: As directed       Allergies as of 08/10/2020       Reactions   Latex Rash        Medication List     STOP taking these medications    lidocaine 5 % Commonly known as: Lidoderm       TAKE these medications    acetaminophen 325 MG tablet Commonly known as: TYLENOL Take 2 tablets (650 mg total) by mouth every 6 (six) hours as needed for  mild pain (or Fever >/= 101). What changed:  medication strength how much to take reasons to take this   albuterol 108 (90 Base) MCG/ACT inhaler Commonly known as: VENTOLIN HFA Inhale 2 puffs into the lungs every 6 (six) hours as needed for wheezing or shortness of breath.   Apixaban Starter Pack (10mg  and 5mg ) Commonly known as: ELIQUIS STARTER PACK Take as directed on package: start with two-5mg  tablets twice daily for 7 days. On day 8, switch to one-5mg  tablet twice daily.   apixaban 5 MG Tabs tablet Commonly known as: ELIQUIS Take 1 tablet (5 mg total) by mouth 2 (two) times daily. Start taking on: September 09, 2020   atorvastatin 20 MG tablet Commonly known as: LIPITOR Take 1 tablet (20 mg total) by mouth daily.   diclofenac Sodium 1 % Gel Commonly known as: Voltaren Apply 4 g topically 4  (four) times daily.   HEARTBURN RELIEF PO Take 1 tablet by mouth daily as needed (heartburn).   hydrochlorothiazide 25 MG tablet Commonly known as: HYDRODIURIL Take 25 mg by mouth daily.   hydrOXYzine 25 MG tablet Commonly known as: ATARAX/VISTARIL Take 1 tablet (25 mg total) by mouth 3 (three) times daily as needed for anxiety.   multivitamin with minerals Tabs tablet Take 1 tablet by mouth daily.   nicotine 21 mg/24hr patch Commonly known as: NICODERM CQ - dosed in mg/24 hours Place 1 patch (21 mg total) onto the skin daily. Start taking on: August 11, 2020   omeprazole 40 MG capsule Commonly known as: PRILOSEC Take 1 capsule (40 mg total) by mouth daily. What changed:  medication strength how much to take when to take this   Trelegy Ellipta 100-62.5-25 MCG/INH Aepb Generic drug: Fluticasone-Umeclidin-Vilant Inhale 1 puff into the lungs daily.   Vitamin D (Ergocalciferol) 1.25 MG (50000 UNIT) Caps capsule Commonly known as: DRISDOL Take 50,000 Units by mouth once a week.       Allergies  Allergen Reactions   Latex Rash    Follow-up Information     August 13, 2020, 03-01-1985. Schedule an appointment as soon as possible for a visit in 2 week(s).   Specialty: Physician Assistant Contact information: [address] [place] [address] [place] [phone]         [place], [id number], MD. Schedule an appointment as soon as possible for a visit in 2 month(s).   Specialty: Pulmonary Disease Contact information: [address] [place] [address] [place] [phone]                  The results of significant diagnostics from this hospitalization (including imaging, microbiology, ancillary and laboratory) are listed below for reference.    Significant Diagnostic Studies: CT Angio Chest PE W and/or Wo Contrast  Result Date: 08/05/2020 CLINICAL DATA:  Chest pain, high clinical suspicion for pulmonary embolism, has acute deep venous thrombosis in  LEFT lower extremity EXAM: CT ANGIOGRAPHY CHEST WITH CONTRAST TECHNIQUE: Multidetector CT imaging of the chest was performed using the standard protocol during bolus administration of intravenous contrast. Multiplanar CT image reconstructions and MIPs were obtained to evaluate the vascular anatomy. CONTRAST:  01-16-1992 OMNIPAQUE IOHEXOL 350 MG/ML SOLN IV COMPARISON:  CT chest 05/01/2019 FINDINGS: Cardiovascular: Aorta normal caliber without aneurysm or dissection. Multiple filling defects throughout the pulmonary arterial system bilaterally consistent with pulmonary embolism. These include pulmonary emboli in all lobes bilaterally including a saddle embolus at the bifurcation of the RIGHT pulmonary artery into RIGHT upper lobe and descending  interlobar pulmonary artery as well as bifurcation of the RIGHT lower lobe pulmonary artery. (RV/LV Ratio = 1.14, abnormal/elevated. Heart otherwise unremarkable. No pericardial effusion. Mediastinum/Nodes: Esophagus unremarkable. Base of cervical region normal appearance. No thoracic adenopathy. Lungs/Pleura: 5 mm RIGHT lower lobe nodule image 55, larger than on previous exam when it measured 2-3 mm, uncertain if related to interval growth or differences in slice averaging. Lungs clear. No pulmonary infiltrate, pleural effusion, or pneumothorax. Upper Abdomen: No upper abdominal abnormalities identified Musculoskeletal: Osseous structures unremarkable Review of the MIP images confirms the above findings. IMPRESSION: Multiple BILATERAL pulmonary emboli including a saddle embolus at the bifurcation of the RIGHT pulmonary artery into RIGHT upper and descending interlobar pulmonary arteries as well as additional emboli in all pulmonary lobes bilaterally. Positive for acute PE with CT evidence of right heart strain (RV/LV Ratio = 1.14) consistent with at least submassive (intermediate risk) PE. The presence of right heart strain has been associated with an increased risk of morbidity  and mortality. Please refer to the "PE Focused" order set in EPIC. 5 mm RIGHT lower lobe pulmonary nodule, nonspecific, slightly larger than on the prior study from 2021; recommend follow-up imaging in 6 months to assess stability. Critical Value/emergent results were called by telephone at the time of interpretation on 08/05/2020 at 1:19 pm to provider Marianna FussICHARD DYKSTRA MD, who verbally acknowledged these results. Electronically Signed   By: Ulyses SouthwardMark  Boles M.D.   On: 08/05/2020 13:21   US Venous Img Lower Unilateral Left  Result Date: 08/05/2020 CLINICAL DATA:  40 year old male with a history of left lower extremity swelling EXAM: LEFT LOWER EXTREMITY VENOUS DOPPLER ULTRASOUND TECHNIQUE: Gray-scale sonography with graded compression, as well as color Doppler and duplex ultrasound were performed to evaluate the lower extremity deep venous systems from the level of the common femoral vein and including the common femoral, femoral, profunda femoral, popliteal and calf veins including the posterior tibial, peroneal and gastrocnemius veins when visible. The superficial great saphenous vein was also interrogated. Spectral Doppler was utilized to evaluate flow at rest and with distal augmentation maneuvers in the common femoral, femoral and popliteal veins. COMPARISON:  None. FINDINGS: Contralateral Common Femoral Vein: Respiratory phasicity is normal and symmetric with the symptomatic side. No evidence of thrombus. Normal compressibility. Common Femoral Vein: No evidence of thrombus. Normal compressibility, respiratory phasicity and response to augmentation. Saphenofemoral Junction: No evidence of thrombus. Normal compressibility and flow on color Doppler imaging. Profunda Femoral Vein: No evidence of thrombus. Normal compressibility and flow on color Doppler imaging. Femoral Vein: Proximal left femoral vein patent. Distal left femoral vein noncompressible with occlusive thrombus. Thrombus extends into the popliteal vein.  Popliteal Vein: Noncompressible popliteal vein without flow. Calf Veins: Patent posterior tibial vein. Occlusive thrombus of the peroneal vein without compressibility. Superficial Great Saphenous Vein: No evidence of thrombus. Normal compressibility and flow on color Doppler imaging. Other Findings:  Gastrocs vein demonstrates occlusive thrombus. IMPRESSION: Positive sonographic survey of the left lower extremity demonstrating DVT of the distal femoral vein extending through the popliteal vein into the peroneal tibial vein and the intramuscular veins. These results were discussed by telephone at the time of interpretation on 08/05/2020 at 11:56 am with Dr. Stevie Kernykstra Electronically Signed   By: Gilmer MorJaime  Wagner D.O.   On: 08/05/2020 11:58   DG Chest Port 1 View  Result Date: 07/20/2020 CLINICAL DATA:  Acute onset of chest pressure and pain for 3 hours. EXAM: PORTABLE CHEST 1 VIEW COMPARISON:  09/29/2019 FINDINGS: The heart size and  mediastinal contours are within normal limits. Both lungs are clear. The visualized skeletal structures are unremarkable. IMPRESSION: No active disease. Electronically Signed   By: Signa Kell M.D.   On: 07/20/2020 02:39   ECHOCARDIOGRAM COMPLETE  Result Date: 08/06/2020    ECHOCARDIOGRAM REPORT   Patient Name:   Frank Robinson Date of Exam: 08/06/2020 Medical Rec #:  [id number]         Height:       72.0 in Accession #:    [id number]        Weight:       296.3 lb Date of Birth:  Feb 09, 1980          BSA:          2.518 m Patient Age:    39 years          BP:           106/44 mmHg Patient Gender: M                 HR:           67 bpm. Exam Location:  Inpatient Procedure: 2D Echo, Cardiac Doppler, Color Doppler and Intracardiac            Opacification Agent Indications:    Pulmonary Embolus I26.09  History:        Patient has no prior history of Echocardiogram examinations.                 Risk Factors:Dyslipidemia and Current Smoker. GERD.  Sonographer:    Renella Cunas RDCS Referring  Phys: [id number] Rodolph Bong  Sonographer Comments: Image acquisition challenging due to patient body habitus. IMPRESSIONS  1. Left ventricular ejection fraction, by estimation, is 55 to 60%. The left ventricle has normal function. The left ventricle has no regional wall motion abnormalities. Left ventricular diastolic parameters are consistent with Grade II diastolic dysfunction (pseudonormalization).  2. Right ventricular systolic function is normal. The right ventricular size is mildly enlarged. Tricuspid regurgitation signal is inadequate for assessing PA pressure.  3. The mitral valve is normal in structure. No evidence of mitral valve regurgitation. No evidence of mitral stenosis.  4. The aortic valve is tricuspid. Aortic valve regurgitation is not visualized. No aortic stenosis is present.  5. The inferior vena cava is normal in size with greater than 50% respiratory variability, suggesting right atrial pressure of 3 mmHg. FINDINGS  Left Ventricle: Left ventricular ejection fraction, by estimation, is 55 to 60%. The left ventricle has normal function. The left ventricle has no regional wall motion abnormalities. Definity contrast agent was given IV to delineate the left ventricular  endocardial borders. The left ventricular internal cavity size was normal in size. There is no left ventricular hypertrophy. Left ventricular diastolic parameters are consistent with Grade II diastolic dysfunction (pseudonormalization). Right Ventricle: The right ventricular size is mildly enlarged. No increase in right ventricular wall thickness. Right ventricular systolic function is normal. Tricuspid regurgitation signal is inadequate for assessing PA pressure. Left Atrium: Left atrial size was normal in size. Right Atrium: Right atrial size was normal in size. Pericardium: There is no evidence of pericardial effusion. Mitral Valve: The mitral valve is normal in structure. No evidence of mitral valve regurgitation. No evidence  of mitral valve stenosis. Tricuspid Valve: The tricuspid valve is normal in structure. Tricuspid valve regurgitation is trivial. Aortic Valve: The aortic valve is tricuspid. Aortic valve regurgitation is not visualized. No aortic stenosis is present. Pulmonic Valve: The pulmonic valve  was normal in structure. Pulmonic valve regurgitation is not visualized. Aorta: The aortic root is normal in size and structure. Venous: The inferior vena cava is normal in size with greater than 50% respiratory variability, suggesting right atrial pressure of 3 mmHg. IAS/Shunts: No atrial level shunt detected by color flow Doppler.  LEFT VENTRICLE PLAX 2D LVIDd:         5.40 cm      Diastology LVIDs:         3.70 cm      LV e' medial:    6.15 cm/s LV PW:         0.90 cm      LV E/e' medial:  11.8 LV IVS:        0.90 cm      LV e' lateral:   9.23 cm/s LVOT diam:     2.40 cm      LV E/e' lateral: 7.9 LV SV:         69 LV SV Index:   27 LVOT Area:     4.52 cm  LV Volumes (MOD) LV vol d, MOD A4C: 103.0 ml LV vol s, MOD A4C: 38.5 ml LV SV MOD A4C:     103.0 ml RIGHT VENTRICLE RV S prime:     11.00 cm/s TAPSE (M-mode): 2.4 cm LEFT ATRIUM             Index       RIGHT ATRIUM           Index LA diam:        3.60 cm 1.43 cm/m  RA Area:     13.00 cm LA Vol (A2C):   36.3 ml 14.42 ml/m RA Volume:   29.40 ml  11.68 ml/m LA Vol (A4C):   38.5 ml 15.29 ml/m LA Biplane Vol: 39.6 ml 15.73 ml/m  AORTIC VALVE LVOT Vmax:   65.90 cm/s LVOT Vmean:  52.400 cm/s LVOT VTI:    0.152 m  AORTA Ao Root diam: 3.50 cm MITRAL VALVE MV Area (PHT): 3.11 cm    SHUNTS MV Decel Time: 244 msec    Systemic VTI:  0.15 m MV E velocity: 72.80 cm/s  Systemic Diam: 2.40 cm MV A velocity: 56.60 cm/s MV E/A ratio:  1.29 Marca Ancona MD Electronically signed by Marca Ancona MD Signature Date/Time: 08/06/2020/2:25:06 PM    Final     Microbiology: Recent Results (from the past 240 hour(s))  Resp Panel by RT-PCR (Flu A&B, Covid) Nasopharyngeal Swab     Status: None    Collection Time: 08/05/20  1:20 PM   Specimen: Nasopharyngeal Swab; Nasopharyngeal(NP) swabs in vial transport medium  Result Value Ref Range Status   SARS Coronavirus 2 by RT PCR NEGATIVE NEGATIVE Final    Comment: (NOTE) SARS-CoV-2 target nucleic acids are NOT DETECTED.  The SARS-CoV-2 RNA is generally detectable in upper respiratory specimens during the acute phase of infection. The lowest concentration of SARS-CoV-2 viral copies this assay can detect is 138 copies/mL. A negative result does not preclude SARS-Cov-2 infection and should not be used as the sole basis for treatment or other patient management decisions. A negative result may occur with  improper specimen collection/handling, submission of specimen other than nasopharyngeal swab, presence of viral mutation(s) within the areas targeted by this assay, and inadequate number of viral copies(<138 copies/mL). A negative result must be combined with clinical observations, patient history, and epidemiological information. The expected result is Negative.  Fact Sheet for Patients:  BloggerCourse.com  Fact Sheet for Healthcare Providers:  SeriousBroker.it  This test is no t yet approved or cleared by the [place] FDA and  has been authorized for detection and/or diagnosis of SARS-CoV-2 by FDA under an Emergency Use Authorization (EUA). This EUA will remain  in effect (meaning this test can be used) for the duration of the COVID-19 declaration under Section 564(b)(1) of the Act, 21 U.S.C.section 360bbb-3(b)(1), unless the authorization is terminated  or revoked sooner.       Influenza A by PCR NEGATIVE NEGATIVE Final   Influenza B by PCR NEGATIVE NEGATIVE Final    Comment: (NOTE) The Xpert Xpress SARS-CoV-2/FLU/RSV plus assay is intended as an aid in the diagnosis of influenza from Nasopharyngeal swab specimens and should not be used as a sole basis for treatment.  Nasal washings and aspirates are unacceptable for Xpert Xpress SARS-CoV-2/FLU/RSV testing.  Fact Sheet for Patients: BloggerCourse.com  Fact Sheet for Healthcare Providers: SeriousBroker.it  This test is not yet approved or cleared by the [place] FDA and has been authorized for detection and/or diagnosis of SARS-CoV-2 by FDA under an Emergency Use Authorization (EUA). This EUA will remain in effect (meaning this test can be used) for the duration of the COVID-19 declaration under Section 564(b)(1) of the Act, 21 U.S.C. section 360bbb-3(b)(1), unless the authorization is terminated or revoked.  Performed at [facility], [address] [place], [place] [address]   MRSA Next Gen by PCR, Nasal     Status: None   Collection Time: 08/05/20  4:55 PM   Specimen: Nasal Mucosa; Nasal Swab  Result Value Ref Range Status   MRSA by PCR Next Gen NOT DETECTED NOT DETECTED Final    Comment: (NOTE) The GeneXpert MRSA Assay (FDA approved for NASAL specimens only), is one component of a comprehensive MRSA colonization surveillance program. It is not intended to diagnose MRSA infection nor to guide or monitor treatment for MRSA infections. Test performance is not FDA approved in patients less than 23 years old. Performed at [facility], [address], [place], [place] [address]   Urine culture     Status: Abnormal   Collection Time: 08/06/20  5:32 AM   Specimen: Urine, Clean Catch  Result Value Ref Range Status   Specimen Description   Final    URINE, CLEAN CATCH Performed at [facility], [address] [place], [place] [address]    Special Requests   Final    NONE Performed at [facility], [address], [place], [place] [address]    Culture (A)  Final    <10,000 COLONIES/mL INSIGNIFICANT GROWTH Performed at [facility], [address],  [place], [place] [address]    Report Status 08/07/2020 FINAL  Final     Labs: Basic Metabolic Panel: Recent Labs  Lab 08/06/20 0250 08/07/20 0013 08/08/20 0454 08/09/20 0512 08/10/20 0429  NA 138 136 140 139 138  K 4.3 4.3 4.2 4.4 4.6  CL 99 100 101 101 99  CO2 GLUCOSE 96 165* 103* 103* 103*  BUN 18 17 21* 18 17  CREATININE 1.06 0.93 1.15 1.15 1.24  CALCIUM 9.2 9.4 9.4 9.5 9.3  MG 2.1  --  2.1  --   --   PHOS 5.3* 4.2  --   --   --    Liver Function Tests: Recent Labs  Lab 08/06/20 0250 08/07/20 0013  AST 28  --  ALT 30  --   ALKPHOS 59  --   BILITOT 1.1  --   PROT 6.8  --   ALBUMIN 3.6 3.7   No results for input(s): LIPASE, AMYLASE in the last 168 hours. No results for input(s): AMMONIA in the last 168 hours. CBC: Recent Labs  Lab 08/05/20 1310 08/06/20 0250 08/07/20 0013 08/08/20 0454 08/09/20 0512 08/10/20 0429  WBC 10.3 8.4 9.6 9.6 8.6 8.8  NEUTROABS 6.0  --   --   --   --   --   HGB 15.7 15.3 14.9 14.3 14.3 14.9  HCT 48.8 48.4 48.3 47.3 46.7 47.7  MCV 85.6 87.1 88.8 89.8 88.6 88.7  PLT 166 145* 187 222 224 219   Cardiac Enzymes: No results for input(s): CKTOTAL, CKMB, CKMBINDEX, TROPONINI in the last 168 hours. BNP: BNP (last 3 results) Recent Labs    08/05/20 1310  BNP 29.5    ProBNP (last 3 results) No results for input(s): PROBNP in the last 8760 hours.  CBG: No results for input(s): GLUCAP in the last 168 hours.     Signed:  Ramiro Harvest MD.  [facility] 08/10/2020, 10:36 AM

## 2020-08-10 NOTE — ED Triage Notes (Signed)
Pt was discharged today d/t DVT and PE. This evening pt experienced L chest pain and ShOB. Pt also reports sudden increase in leg pain on the L. Pt currently on Eliquis.

## 2020-08-10 NOTE — Progress Notes (Signed)
SATURATION QUALIFICATIONS: (This note is used to comply with regulatory documentation for home oxygen)  Patient Saturations on Room Air at Rest = 95%  Patient Saturations on Room Air while Ambulating = 96%  Patient Saturations on 0 Liters of oxygen while Ambulating = 95%  Please briefly explain why patient needs home oxygen:

## 2020-08-10 NOTE — Progress Notes (Signed)
Pharmacy at bedside and reviewed medications. RN then provided d/c instructions to patient and spouse.

## 2020-08-11 ENCOUNTER — Emergency Department (HOSPITAL_BASED_OUTPATIENT_CLINIC_OR_DEPARTMENT_OTHER): Payer: Medicaid Other

## 2020-08-11 LAB — CBC WITH DIFFERENTIAL/PLATELET
Abs Immature Granulocytes: 0.02 10*3/uL (ref 0.00–0.07)
Basophils Absolute: 0.1 10*3/uL (ref 0.0–0.1)
Basophils Relative: 1 %
Eosinophils Absolute: 0.2 10*3/uL (ref 0.0–0.5)
Eosinophils Relative: 3 %
HCT: 46 % (ref 39.0–52.0)
Hemoglobin: 15.1 g/dL (ref 13.0–17.0)
Immature Granulocytes: 0 %
Lymphocytes Relative: 37 %
Lymphs Abs: 2.9 10*3/uL (ref 0.7–4.0)
MCH: 28.1 pg (ref 26.0–34.0)
MCHC: 32.8 g/dL (ref 30.0–36.0)
MCV: 85.5 fL (ref 80.0–100.0)
Monocytes Absolute: 0.5 10*3/uL (ref 0.1–1.0)
Monocytes Relative: 6 %
Neutro Abs: 4.3 10*3/uL (ref 1.7–7.7)
Neutrophils Relative %: 53 %
Platelets: 245 10*3/uL (ref 150–400)
RBC: 5.38 MIL/uL (ref 4.22–5.81)
RDW: 15.3 % (ref 11.5–15.5)
WBC: 8 10*3/uL (ref 4.0–10.5)
nRBC: 0 % (ref 0.0–0.2)

## 2020-08-11 LAB — BRAIN NATRIURETIC PEPTIDE: B Natriuretic Peptide: 7.3 pg/mL (ref 0.0–100.0)

## 2020-08-11 LAB — BASIC METABOLIC PANEL
Anion gap: 8 (ref 5–15)
BUN: 17 mg/dL (ref 6–20)
CO2: 29 mmol/L (ref 22–32)
Calcium: 9.4 mg/dL (ref 8.9–10.3)
Chloride: 101 mmol/L (ref 98–111)
Creatinine, Ser: 1.14 mg/dL (ref 0.61–1.24)
GFR, Estimated: >60 mL/min (ref >60)
Glucose, Bld: 124 mg/dL — ABNORMAL HIGH (ref 70–99)
Potassium: 3.7 mmol/L (ref 3.5–5.1)
Sodium: 138 mmol/L (ref 135–145)

## 2020-08-11 LAB — TROPONIN I (HIGH SENSITIVITY): Troponin I (High Sensitivity): 4 ng/L (ref <18)

## 2020-08-11 MED ORDER — IOHEXOL 350 MG/ML SOLN
100.0000 mL | Freq: Once | INTRAVENOUS | Status: AC | PRN
  Administered 2020-08-11: 100 mL via INTRAVENOUS

## 2020-08-11 NOTE — ED Provider Notes (Signed)
[facility] EMERGENCY DEPARTMENT Provider Note   CSN: [id number] Arrival date & time: 08/10/20  2224     History Chief Complaint  Patient presents with   Chest Pain    Frank Robinson is a 40 y.o. male.  40 year old male that was discharged from the hospital earlier today with a pretty significant lota pulmonary embolism secondary to a left leg DVT.  Patient went to visit his dad walked on the garden did a couple things and then went to get supper and at that time he started having chest pain again.  Patient states that it seems to have resolved now but was similar to when he had his chest pain before but not as severe.  He also started have some left lateral hip pain and thought that could possibly be related to his known DVT there is a present here for further evaluation no worsening shortness of breath.  No syncope.  Once again a lot of his symptoms have significantly improved at this point. He wonders if his anxiety is contributing but wants eval to ensure no new clots or other pathology.    Chest Pain     Past Medical History:  Diagnosis Date   Anxiety    Asthma    Back pain    DDD (degenerative disc disease), lumbar    DDD (degenerative disc disease), lumbar    Difficult intubation    GERD (gastroesophageal reflux disease)    High cholesterol    Kidney calculi    Kidney stones    OSA (obstructive sleep apnea)     Patient Active Problem List   Diagnosis Date Noted   Right lower lobe pulmonary nodule: 62mm per CT angiogram chest 08/05/2020 08/07/2020   Pulmonary embolism (HCC) 08/05/2020   Acute saddle pulmonary embolus (HCC) 08/05/2020   Acute deep vein thrombosis (DVT) of left lower extremity (HCC) 08/05/2020   OSA (obstructive sleep apnea) 08/05/2020   Tobacco abuse 08/05/2020   Asthma 09/30/2019   DDD (degenerative disc disease), lumbar 09/30/2019   GERD (gastroesophageal reflux disease) 09/30/2019   Hyperlipidemia 09/30/2019   Acute hypoxemic  respiratory failure due to COVID-19 (HCC) 09/29/2019    Past Surgical History:  Procedure Laterality Date   CYSTOSCOPY W/ URETERAL STENT PLACEMENT  12/18/2010   Procedure: CYSTOSCOPY WITH RETROGRADE PYELOGRAM/URETERAL STENT PLACEMENT;  Surgeon: Lindaann Slough, MD;  Location: [facility];  Service: Urology;  Laterality: Left;  left ureteroscopy   LITHOTRIPSY         No family history on file.  Social History   Tobacco Use   Smoking status: Every Day    Packs/day: 1.00    Pack years: 0.00    Types: Cigarettes   Smokeless tobacco: Never  Vaping Use   Vaping Use: Some days  Substance Use Topics   Alcohol use: Yes    Comment: occasionally   Drug use: Yes    Home Medications Prior to Admission medications   Medication Sig Start Date End Date Taking? Authorizing Provider  acetaminophen (TYLENOL) 325 MG tablet Take 2 tablets (650 mg total) by mouth every 6 (six) hours as needed for mild pain (or Fever >/= 101). 08/10/20   Rodolph Bong, MD  albuterol (VENTOLIN HFA) 108 (90 Base) MCG/ACT inhaler Inhale 2 puffs into the lungs every 6 (six) hours as needed for wheezing or shortness of breath. 08/10/20   Rodolph Bong, MD  apixaban (ELIQUIS) 5 MG TABS tablet Take 1 tablet (5 mg total) by mouth 2 (two) times  daily. 09/09/20   Rodolph Bong, MD  APIXABAN Everlene Balls) VTE STARTER PACK (10MG  AND 5MG ) Take as directed on package: start with two-5mg  tablets twice daily for 7 days. On day 8, switch to one-5mg  tablet twice daily. 08/10/20   , MD  atorvastatin (LIPITOR) 20 MG tablet Take 1 tablet (20 mg total) by mouth daily. 08/10/20   Rodolph Bong, MD  Cimetidine (HEARTBURN RELIEF PO) Take 1 tablet by mouth daily as needed (heartburn).    [provider]  diclofenac Sodium (VOLTAREN) 1 % GEL Apply 4 g topically 4 (four) times daily. 08/05/20   Palumbo, April, MD  Fluticasone-Umeclidin-Vilant (TRELEGY ELLIPTA) 100-62.5-25 MCG/INH AEPB Inhale 1 puff into the lungs  daily. 08/10/20   03-01-1985, MD  hydrochlorothiazide (HYDRODIURIL) 25 MG tablet Take 25 mg by mouth daily. 07/29/20   [provider]  hydrOXYzine (ATARAX/VISTARIL) 25 MG tablet Take 1 tablet (25 mg total) by mouth 3 (three) times daily as needed for anxiety. 08/10/20   07/31/20, MD  Multiple Vitamin (MULTIVITAMIN WITH MINERALS) TABS tablet Take 1 tablet by mouth daily.    [provider]  nicotine (NICODERM CQ - DOSED IN MG/24 HOURS) 21 mg/24hr patch Place 1 patch (21 mg total) onto the skin daily. 08/11/20   Rodolph Bong, MD  omeprazole (PRILOSEC) 40 MG capsule Take 1 capsule (40 mg total) by mouth daily. 08/10/20   Rodolph Bong, MD  Vitamin D, Ergocalciferol, (DRISDOL) 1.25 MG (50000 UNIT) CAPS capsule Take 50,000 Units by mouth once a week. 07/29/20   [provider]    Allergies    Latex  Review of Systems   Review of Systems  Cardiovascular:  Positive for chest pain.  All other systems reviewed and are negative.  Physical Exam Updated Vital Signs BP 112/79   Pulse 85   Temp 98.2 F (36.8 C) (Oral)   Resp (!) 22   Ht 6' (1.829 m)   Wt 135.2 kg   SpO2 97%   BMI 40.42 kg/m   Physical Exam Vitals and nursing note reviewed.  Constitutional:      Appearance: He is well-developed.  HENT:     Head: Normocephalic and atraumatic.  Cardiovascular:     Rate and Rhythm: Normal rate.  Pulmonary:     Effort: Pulmonary effort is normal. No respiratory distress.  Chest:     Chest wall: No mass or deformity.  Abdominal:     General: There is no distension.     Palpations: Abdomen is soft.  Musculoskeletal:        General: Normal range of motion.     Cervical back: Normal range of motion.     Left lower leg: No tenderness.     Comments: Ttp to left anterior pelvis.  Skin:    General: Skin is warm and dry.  Neurological:     Mental Status: He is alert.    ED Results / Procedures / Treatments   Labs (all labs ordered are  listed, but only abnormal results are displayed) Labs Reviewed  BASIC METABOLIC PANEL - Abnormal; Notable for the following components:      Result Value   Glucose, Bld 124 (*)    All other components within normal limits  CBC WITH DIFFERENTIAL/PLATELET  BRAIN NATRIURETIC PEPTIDE  TROPONIN I (HIGH SENSITIVITY)    EKG None  Radiology DG Pelvis 1-2 Views  Result Date: 08/11/2020 CLINICAL DATA:  Discharged following DVT and pulmonary embolism now  with left chest pain and shortness of breath, increasing left leg pain, on Eliquis EXAM: PELVIS - 1-2 VIEW COMPARISON:  None. FINDINGS: Patient rotated in a left anterior obliquity. Accounting for rotation, no acute or worrisome osseous abnormality is evident. Normal bone mineralization. No suspicious osseous lesions. Soft tissues are unremarkable. IMPRESSION: Left anterior oblique rotation resulting in asymmetric projection of the left hemipelvis. No discernible acute or worrisome osseous abnormality. Electronically Signed   By: Kreg Shropshire M.D.   On: 08/11/2020 00:41    Procedures Procedures   Medications Ordered in ED Medications  iohexol (OMNIPAQUE) 350 MG/ML injection 100 mL (100 mLs Intravenous Contrast Given 08/11/20 0031)    ED Course  I have reviewed the triage vital signs and the nursing notes.  Pertinent labs & imaging results that were available during my care of the patient were reviewed by me and considered in my medical decision making (see chart for details).    MDM Rules/Calculators/A&P                          No change in his pulmonary emboli.  Vital signs are stable.  His troponin and BNP are normal making acute pulmonary hypertension unlikely.  I do not know if this is anxiety or not however I do not see any acute reason for readmission to the hospital or change in management at this time.  Final Clinical Impression(s) / ED Diagnoses Final diagnoses:  Nonspecific chest pain    Rx / DC Orders ED Discharge Orders      None        Reneka Nebergall, Barbara Cower, MD 08/11/20 [phone]

## 2020-08-20 ENCOUNTER — Encounter (HOSPITAL_BASED_OUTPATIENT_CLINIC_OR_DEPARTMENT_OTHER): Payer: Self-pay | Admitting: Emergency Medicine

## 2020-08-20 ENCOUNTER — Other Ambulatory Visit: Payer: Self-pay

## 2020-08-20 ENCOUNTER — Emergency Department (HOSPITAL_BASED_OUTPATIENT_CLINIC_OR_DEPARTMENT_OTHER)
Admission: EM | Admit: 2020-08-20 | Discharge: 2020-08-20 | Disposition: A | Discharge status: Home / Self Care | Payer: Medicaid Other | Attending: Emergency Medicine | Admitting: Emergency Medicine

## 2020-08-20 ENCOUNTER — Emergency Department (HOSPITAL_BASED_OUTPATIENT_CLINIC_OR_DEPARTMENT_OTHER): Payer: Medicaid Other

## 2020-08-20 DIAGNOSIS — Z91040 Latex allergy status: Secondary | ICD-10-CM | POA: Insufficient documentation

## 2020-08-20 DIAGNOSIS — M79605 Pain in left leg: Secondary | ICD-10-CM | POA: Diagnosis not present

## 2020-08-20 DIAGNOSIS — R0602 Shortness of breath: Secondary | ICD-10-CM | POA: Insufficient documentation

## 2020-08-20 DIAGNOSIS — R0789 Other chest pain: Secondary | ICD-10-CM | POA: Diagnosis present

## 2020-08-20 DIAGNOSIS — R072 Precordial pain: Secondary | ICD-10-CM | POA: Insufficient documentation

## 2020-08-20 DIAGNOSIS — J45909 Unspecified asthma, uncomplicated: Secondary | ICD-10-CM | POA: Insufficient documentation

## 2020-08-20 DIAGNOSIS — F17210 Nicotine dependence, cigarettes, uncomplicated: Secondary | ICD-10-CM | POA: Insufficient documentation

## 2020-08-20 DIAGNOSIS — Z8616 Personal history of COVID-19: Secondary | ICD-10-CM | POA: Insufficient documentation

## 2020-08-20 LAB — TROPONIN I (HIGH SENSITIVITY)
Troponin I (High Sensitivity): 4 ng/L (ref <18)
Troponin I (High Sensitivity): 4 ng/L (ref <18)

## 2020-08-20 LAB — COMPREHENSIVE METABOLIC PANEL
ALT: 25 U/L (ref 0–44)
AST: 24 U/L (ref 15–41)
Albumin: 3.6 g/dL (ref 3.5–5.0)
Alkaline Phosphatase: 59 U/L (ref 38–126)
Anion gap: 7 (ref 5–15)
BUN: 16 mg/dL (ref 6–20)
CO2: 29 mmol/L (ref 22–32)
Calcium: 8.9 mg/dL (ref 8.9–10.3)
Chloride: 102 mmol/L (ref 98–111)
Creatinine, Ser: 0.96 mg/dL (ref 0.61–1.24)
GFR, Estimated: >60 mL/min (ref >60)
Glucose, Bld: 94 mg/dL (ref 70–99)
Potassium: 3.3 mmol/L — ABNORMAL LOW (ref 3.5–5.1)
Sodium: 138 mmol/L (ref 135–145)
Total Bilirubin: 0.3 mg/dL (ref 0.3–1.2)
Total Protein: 6.8 g/dL (ref 6.5–8.1)

## 2020-08-20 LAB — CBC WITH DIFFERENTIAL/PLATELET
Abs Immature Granulocytes: 0.01 10*3/uL (ref 0.00–0.07)
Basophils Absolute: 0.1 10*3/uL (ref 0.0–0.1)
Basophils Relative: 1 %
Eosinophils Absolute: 0.2 10*3/uL (ref 0.0–0.5)
Eosinophils Relative: 3 %
HCT: 43.5 % (ref 39.0–52.0)
Hemoglobin: 14.2 g/dL (ref 13.0–17.0)
Immature Granulocytes: 0 %
Lymphocytes Relative: 45 %
Lymphs Abs: 3.5 10*3/uL (ref 0.7–4.0)
MCH: 27.8 pg (ref 26.0–34.0)
MCHC: 32.6 g/dL (ref 30.0–36.0)
MCV: 85.1 fL (ref 80.0–100.0)
Monocytes Absolute: 0.6 10*3/uL (ref 0.1–1.0)
Monocytes Relative: 8 %
Neutro Abs: 3.3 10*3/uL (ref 1.7–7.7)
Neutrophils Relative %: 43 %
Platelets: 280 10*3/uL (ref 150–400)
RBC: 5.11 MIL/uL (ref 4.22–5.81)
RDW: 14.7 % (ref 11.5–15.5)
WBC: 7.6 10*3/uL (ref 4.0–10.5)
nRBC: 0 % (ref 0.0–0.2)

## 2020-08-20 NOTE — Discharge Instructions (Addendum)
You were seen in the emergency department today with leg pain and chest pain.  This may be residual discomfort from your blood clots but your lab work and vital signs are looking normal.  You are not showing signs of a heart attack.  Please continue your home medications and follow closely with your primary care doctor.

## 2020-08-20 NOTE — ED Triage Notes (Signed)
Pt c/o chest tightness and shob. Pt has hx of PE and DVT x 4 weeks ago. Pt reports pain in returned to left leg.

## 2020-08-20 NOTE — ED Provider Notes (Signed)
Emergency Department Provider Note   I have reviewed the triage vital signs and the nursing notes.   HISTORY  Chief Complaint Shortness of Breath   HPI Frank Robinson is a 40 y.o. male with past medical history reviewed below including recent diagnosis of left leg DVT and bilateral PE on Eliquis presents to the emergency department with chest pain and leg pain.  He has been seen several times this month with return of symptoms.  He denies any fevers or chills.  No productive cough or hemoptysis.  Has been compliant with his home medications.  He feels the continued tightness in his left leg along with some chest discomfort.  Has some baseline shortness of breath.  No radiation of symptoms or other clear modifying factors.  No syncope.  Past Medical History:  Diagnosis Date   Anxiety    Asthma    Back pain    DDD (degenerative disc disease), lumbar    DDD (degenerative disc disease), lumbar    Difficult intubation    GERD (gastroesophageal reflux disease)    High cholesterol    Kidney calculi    Kidney stones    OSA (obstructive sleep apnea)     Patient Active Problem List   Diagnosis Date Noted   Right lower lobe pulmonary nodule: 69mm per CT angiogram chest 08/05/2020 08/07/2020   Pulmonary embolism (HCC) 08/05/2020   Acute saddle pulmonary embolus (HCC) 08/05/2020   Acute deep vein thrombosis (DVT) of left lower extremity (HCC) 08/05/2020   OSA (obstructive sleep apnea) 08/05/2020   Tobacco abuse 08/05/2020   Asthma 09/30/2019   DDD (degenerative disc disease), lumbar 09/30/2019   GERD (gastroesophageal reflux disease) 09/30/2019   Hyperlipidemia 09/30/2019   Acute hypoxemic respiratory failure due to COVID-19 (HCC) 09/29/2019    Past Surgical History:  Procedure Laterality Date   CYSTOSCOPY W/ URETERAL STENT PLACEMENT  12/18/2010   Procedure: CYSTOSCOPY WITH RETROGRADE PYELOGRAM/URETERAL STENT PLACEMENT;  Surgeon: Lindaann Slough, MD;  Location: [facility];   Service: Urology;  Laterality: Left;  left ureteroscopy   LITHOTRIPSY      Allergies Latex  History reviewed. No pertinent family history.  Social History Social History   Tobacco Use   Smoking status: Every Day    Packs/day: 1.00    Types: Cigarettes   Smokeless tobacco: Never  Vaping Use   Vaping Use: Some days  Substance Use Topics   Alcohol use: Yes    Comment: occasionally   Drug use: Yes    Review of Systems  Constitutional: No fever/chills Eyes: No visual changes. ENT: No sore throat. Cardiovascular: Positive chest pain. Respiratory: Denies shortness of breath. Gastrointestinal: No abdominal pain.  No nausea, no vomiting.  No diarrhea.  No constipation. Genitourinary: Negative for dysuria. Musculoskeletal: Negative for back pain. Positive left leg pain.  Skin: Negative for rash. Neurological: Negative for headaches, focal weakness or numbness.  10-point ROS otherwise negative.  ____________________________________________   PHYSICAL EXAM:  VITAL SIGNS: ED Triage Vitals  Enc Vitals Group     BP 08/20/20 0117 140/83     Pulse Rate 08/20/20 0117 81     Resp 08/20/20 0117 16     Temp 08/20/20 0117 98.2 F (36.8 C)     Temp Source 08/20/20 0117 Oral     SpO2 08/20/20 0117 97 %     Weight 08/20/20 0115 297 lb 9.9 oz (135 kg)     Height 08/20/20 0115 6' (1.829 m)   Constitutional: Alert and oriented. Well appearing  and in no acute distress. Eyes: Conjunctivae are normal.  Head: Atraumatic. Nose: No congestion/rhinnorhea. Mouth/Throat: Mucous membranes are moist.   Neck: No stridor.  Cardiovascular: Normal rate, regular rhythm. Good peripheral circulation. Grossly normal heart sounds.   Respiratory: Normal respiratory effort.  No retractions. Lungs CTAB. Gastrointestinal: Soft and nontender. No distention.  Musculoskeletal: No lower extremity tenderness with mild edema in the left leg. Normal pulses. No cerulea dolens. No gross deformities of  extremities. Neurologic:  Normal speech and language. No gross focal neurologic deficits are appreciated.  Skin:  Skin is warm, dry and intact. No rash noted.  ____________________________________________   LABS (all labs ordered are listed, but only abnormal results are displayed)  Labs Reviewed  COMPREHENSIVE METABOLIC PANEL - Abnormal; Notable for the following components:      Result Value   Potassium 3.3 (*)    All other components within normal limits  CBC WITH DIFFERENTIAL/PLATELET  TROPONIN I (HIGH SENSITIVITY)  TROPONIN I (HIGH SENSITIVITY)   ____________________________________________  EKG   EKG Interpretation  Date/Time:  Thursday August 20 2020 01:16:13 EDT Ventricular Rate:  79 PR Interval:  189 QRS Duration: 89 QT Interval:  384 QTC Calculation: 441 R Axis:   55 Text Interpretation: Sinus rhythm Probable anteroseptal infarct, old Similar to prior tracing Confirmed by Alona Bene [phone]) on 08/20/2020 1:41:40 AM        ____________________________________________  RADIOLOGY  DG Chest Portable 1 View  Result Date: 08/20/2020 CLINICAL DATA:  Chest pain, dyspnea EXAM: PORTABLE CHEST 1 VIEW COMPARISON:  07/20/2020 FINDINGS: Lungs are clear. No pneumothorax or pleural effusion. Mild cardiomegaly is stable. Pulmonary vascularity is normal. No acute bone abnormality. IMPRESSION: No active disease.  Stable cardiomegaly. Electronically Signed   By: Helyn Numbers MD   On: 08/20/2020 02:00    ____________________________________________   PROCEDURES  Procedure(s) performed:   Procedures  None ____________________________________________   INITIAL IMPRESSION / ASSESSMENT AND PLAN / ED COURSE  Pertinent labs & imaging results that were available during my care of the patient were reviewed by me and considered in my medical decision making (see chart for details).   Patient returns with chest pain and left leg pain.  He has known DVT and bilateral PE.  In  review of the patient's chart I do see 2 ED visits this month with 2 CT angios performed confirming PE. Last imaging on 7/12. Plan for labs and CXR but hold on additional CTA.  Patient's vital signs are within normal limits with no hypoxemia or tachycardia.  He has no change in his EKG to suggest right heart strain.  Plan to obtain troponins.   04:29 AM  Second troponin WNL. Plan for continued supportive care at home with anticoagulation and close PCP follow up. Discussed results and ED return precautions in detail with the patient.  ____________________________________________  FINAL CLINICAL IMPRESSION(S) / ED DIAGNOSES  Final diagnoses:  Precordial chest pain     Note:  This document was prepared using Dragon voice recognition software and may include unintentional dictation errors.  Alona Bene, MD, [facility]    Gisel Vipond, Arlyss Repress, MD 08/20/20 0430

## 2020-08-27 ENCOUNTER — Emergency Department (HOSPITAL_BASED_OUTPATIENT_CLINIC_OR_DEPARTMENT_OTHER)
Admission: EM | Admit: 2020-08-27 | Discharge: 2020-08-27 | Disposition: A | Discharge status: Home / Self Care | Payer: Medicaid Other | Attending: Emergency Medicine | Admitting: Emergency Medicine

## 2020-08-27 ENCOUNTER — Other Ambulatory Visit: Payer: Self-pay

## 2020-08-27 ENCOUNTER — Encounter (HOSPITAL_BASED_OUTPATIENT_CLINIC_OR_DEPARTMENT_OTHER): Payer: Self-pay

## 2020-08-27 DIAGNOSIS — G44219 Episodic tension-type headache, not intractable: Secondary | ICD-10-CM | POA: Diagnosis not present

## 2020-08-27 DIAGNOSIS — Z7951 Long term (current) use of inhaled steroids: Secondary | ICD-10-CM | POA: Diagnosis not present

## 2020-08-27 DIAGNOSIS — J45909 Unspecified asthma, uncomplicated: Secondary | ICD-10-CM | POA: Diagnosis not present

## 2020-08-27 DIAGNOSIS — F17210 Nicotine dependence, cigarettes, uncomplicated: Secondary | ICD-10-CM | POA: Diagnosis not present

## 2020-08-27 DIAGNOSIS — H538 Other visual disturbances: Secondary | ICD-10-CM | POA: Diagnosis present

## 2020-08-27 DIAGNOSIS — Z7901 Long term (current) use of anticoagulants: Secondary | ICD-10-CM | POA: Insufficient documentation

## 2020-08-27 DIAGNOSIS — Z91040 Latex allergy status: Secondary | ICD-10-CM | POA: Diagnosis not present

## 2020-08-27 HISTORY — DX: Acute embolism and thrombosis of unspecified deep veins of unspecified lower extremity: I82.409

## 2020-08-27 MED ORDER — LIDOCAINE 5 % EX PTCH
1.0000 | MEDICATED_PATCH | CUTANEOUS | Status: DC
  Administered 2020-08-27: 1 via TRANSDERMAL
  Filled 2020-08-27: qty 1

## 2020-08-27 NOTE — ED Triage Notes (Signed)
Pt presents with multiple complaints during triage. States that he was at doctor's office for evaluation of arm pain. Pt recently diagnosed with DVT in both legs. Pt states that he has had blurry vision and orthostatic changes for approximately three days. Moving all extremities freely during triage.

## 2020-08-27 NOTE — Discharge Instructions (Addendum)
You were seen in the emergency department today for your headaches.  Your physical exam and vital signs are very reassuring.  The muscles in your neck are in what is called spasm, meaning they are inappropriately tightened up.  This can be quite painful.  To help with your pain you may take Tylenol to help with your pain.   You may also utilize topical pain relief such as Biofreeze, IcyHot, or topical lidocaine patches.  I also recommend that you apply heat to the area, such as a hot shower or heating pad, and follow heat application with massage of the muscles that are most tight.  Regarding the red patch on your arm, while the exact cause is unclear, does not appear to be any emergent issue at this time.  Please monitor your symptoms in that area and follow-up with your primary care doctor.  Please return to the emergency department if you develop any numbness/tingling/weakness in your arms or legs, any difficulty urinating, or urinary incontinence chest pain, shortness of breath, abdominal pain, nausea or vomiting that does not stop, or any other new severe symptoms.

## 2020-08-27 NOTE — ED Notes (Signed)
PT had no significant difference bilaterally of vision loss.

## 2020-08-27 NOTE — ED Provider Notes (Signed)
[facility] EMERGENCY DEPARTMENT Provider Note   CSN: [id number] Arrival date & time: 08/27/20  1309     History Chief Complaint  Patient presents with   Blurred Vision    Frank Robinson is a 40 y.o. male with recent complex medical history presents with concern for  intermittent left-sided headaches and neck pain as well as multiple other complaints.  He presents from his PCPs office.  Patient was recently admitted to the hospital with bilateral PEs and left lower extremity DVT.  He is compliant with his Eliquis since discharge but has had increasing anxiety since that time.  Has had multiple ED visits since his discharge.  States that he recently started using his CPAP machine and endorses that he turns his head to the left throughout the night for increased comfort.  States that he feels as neck has been stiff and sore on that side since starting his CPAP and that this is related to his headache.  He did report blurry vision in triage area, however upon further discussion of this symptom with the patient he states that this has been ongoing for several months and he has recently started reading glasses.  No acute change.  Does also endorse some intermittent lightheadedness with change of position.  Final complaint is a large red spot on the left forearm that has arisen in the last 24 hours, nonpainful, not itchy, denies hitting on anything or any recent new exposures.  Patient is strongly anxious.  I personally reviewed his medical records.  He has history of OSA, DVT, saddle PE on Eliquis, degenerative disc disease and GERD.    HPI     Past Medical History:  Diagnosis Date   Anxiety    Asthma    Back pain    DDD (degenerative disc disease), lumbar    DDD (degenerative disc disease), lumbar    Difficult intubation    DVT (deep venous thrombosis) (HCC)    GERD (gastroesophageal reflux disease)    High cholesterol    Kidney calculi    Kidney stones    OSA  (obstructive sleep apnea)     Patient Active Problem List   Diagnosis Date Noted   Right lower lobe pulmonary nodule: 32mm per CT angiogram chest 08/05/2020 08/07/2020   Pulmonary embolism (HCC) 08/05/2020   Acute saddle pulmonary embolus (HCC) 08/05/2020   Acute deep vein thrombosis (DVT) of left lower extremity (HCC) 08/05/2020   OSA (obstructive sleep apnea) 08/05/2020   Tobacco abuse 08/05/2020   Asthma 09/30/2019   DDD (degenerative disc disease), lumbar 09/30/2019   GERD (gastroesophageal reflux disease) 09/30/2019   Hyperlipidemia 09/30/2019   Acute hypoxemic respiratory failure due to COVID-19 (HCC) 09/29/2019    Past Surgical History:  Procedure Laterality Date   CYSTOSCOPY W/ URETERAL STENT PLACEMENT  12/18/2010   Procedure: CYSTOSCOPY WITH RETROGRADE PYELOGRAM/URETERAL STENT PLACEMENT;  Surgeon: Lindaann Slough, MD;  Location: [facility];  Service: Urology;  Laterality: Left;  left ureteroscopy   LITHOTRIPSY         No family history on file.  Social History   Tobacco Use   Smoking status: Every Day    Packs/day: 1.00    Types: Cigarettes   Smokeless tobacco: Never  Vaping Use   Vaping Use: Some days  Substance Use Topics   Alcohol use: Yes    Comment: occasionally   Drug use: Yes    Home Medications Prior to Admission medications   Medication Sig Start Date End Date Taking? Authorizing  Provider  apixaban (ELIQUIS) 5 MG TABS tablet Take 1 tablet (5 mg total) by mouth 2 (two) times daily. 09/09/20  Yes Rodolph Bong, MD  atorvastatin (LIPITOR) 20 MG tablet Take 1 tablet (20 mg total) by mouth daily. 08/10/20  Yes Rodolph Bong, MD  Cimetidine (HEARTBURN RELIEF PO) Take 1 tablet by mouth daily as needed (heartburn).   Yes [provider]  hydrochlorothiazide (HYDRODIURIL) 25 MG tablet Take 25 mg by mouth daily. 07/29/20  Yes [provider]  hydrOXYzine (ATARAX/VISTARIL) 25 MG tablet Take 1 tablet (25 mg total) by mouth 3 (three) times  daily as needed for anxiety. 08/10/20  Yes Rodolph Bong, MD  Multiple Vitamin (MULTIVITAMIN WITH MINERALS) TABS tablet Take 1 tablet by mouth daily.   Yes [provider]  nicotine (NICODERM CQ - DOSED IN MG/24 HOURS) 21 mg/24hr patch Place 1 patch (21 mg total) onto the skin daily. 08/11/20  Yes Rodolph Bong, MD  omeprazole (PRILOSEC) 40 MG capsule Take 1 capsule (40 mg total) by mouth daily. 08/10/20  Yes Rodolph Bong, MD  Vitamin D, Ergocalciferol, (DRISDOL) 1.25 MG (50000 UNIT) CAPS capsule Take 50,000 Units by mouth once a week. 07/29/20  Yes [provider]  acetaminophen (TYLENOL) 325 MG tablet Take 2 tablets (650 mg total) by mouth every 6 (six) hours as needed for mild pain (or Fever >/= 101). 08/10/20   Rodolph Bong, MD  albuterol (VENTOLIN HFA) 108 (90 Base) MCG/ACT inhaler Inhale 2 puffs into the lungs every 6 (six) hours as needed for wheezing or shortness of breath. 08/10/20   Rodolph Bong, MD  APIXABAN Everlene Balls) VTE STARTER PACK (10MG  AND 5MG ) Take as directed on package: start with two-5mg  tablets twice daily for 7 days. On day 8, switch to one-5mg  tablet twice daily. 08/10/20   , MD  diclofenac Sodium (VOLTAREN) 1 % GEL Apply 4 g topically 4 (four) times daily. 08/05/20   Palumbo, April, MD  Fluticasone-Umeclidin-Vilant (TRELEGY ELLIPTA) 100-62.5-25 MCG/INH AEPB Inhale 1 puff into the lungs daily. 08/10/20   03-01-1985, MD    Allergies    Latex  Review of Systems   Review of Systems  Constitutional: Negative.   HENT: Negative.    Eyes:  Positive for visual disturbance. Negative for photophobia.  Respiratory: Negative.    Cardiovascular: Negative.   Gastrointestinal: Negative.   Musculoskeletal:  Positive for myalgias and neck pain.  Skin:  Positive for rash.  Neurological:  Positive for headaches.   Physical Exam Updated Vital Signs BP 130/75   Pulse 98   Temp 98.6 F (37 C) (Oral)   Resp 18   Ht 6'  (1.829 m)   Wt 133.8 kg   SpO2 97%   BMI 40.01 kg/m   Physical Exam Vitals and nursing note reviewed.  Constitutional:      Appearance: He is obese. He is not ill-appearing or toxic-appearing.  HENT:     Head: Normocephalic and atraumatic.     Nose: Nose normal. No congestion.     Mouth/Throat:     Mouth: Mucous membranes are moist.     Pharynx: Oropharynx is clear. Uvula midline. No oropharyngeal exudate or posterior oropharyngeal erythema.     Tonsils: No tonsillar exudate.  Eyes:     General: Lids are normal. Vision grossly intact.        Right eye: No discharge.        Left eye: No discharge.  Extraocular Movements: Extraocular movements intact.     Conjunctiva/sclera: Conjunctivae normal.     Pupils: Pupils are equal, round, and reactive to light.     Visual Fields: Right eye visual fields normal and left eye visual fields normal.  Neck:     Trachea: Trachea and phonation normal.   Cardiovascular:     Rate and Rhythm: Normal rate and regular rhythm.     Pulses: Normal pulses.          Radial pulses are 2+ on the right side and 2+ on the left side.     Heart sounds: Normal heart sounds. No murmur heard. Pulmonary:     Effort: Pulmonary effort is normal. No tachypnea, bradypnea, accessory muscle usage, prolonged expiration or respiratory distress.     Breath sounds: Normal breath sounds. No wheezing or rales.  Chest:     Chest wall: No mass, lacerations, deformity, swelling, tenderness, crepitus or edema.  Abdominal:     General: Bowel sounds are normal. There is no distension.     Palpations: Abdomen is soft.     Tenderness: There is no abdominal tenderness. There is no right CVA tenderness, left CVA tenderness, guarding or rebound.  Musculoskeletal:        General: No deformity.     Cervical back: Normal range of motion and neck supple. No edema, rigidity or crepitus. Muscular tenderness present. No pain with movement or spinous process tenderness.     Right lower  leg: No edema.     Left lower leg: No edema.     Comments: Symmetric strength and sensation in both upper and lower extremities bilaterally.  Lymphadenopathy:     Cervical: No cervical adenopathy.  Skin:    General: Skin is warm and dry.     Capillary Refill: Capillary refill takes less than 2 seconds.       Neurological:     General: No focal deficit present.     Mental Status: He is alert and oriented to person, place, and time. Mental status is at baseline.  Psychiatric:        Mood and Affect: Mood normal.    ED Results / Procedures / Treatments   Labs (all labs ordered are listed, but only abnormal results are displayed) Labs Reviewed - No data to display  EKG EKG Interpretation  Date/Time:  Thursday August 27 2020 13:16:36 EDT Ventricular Rate:  74 PR Interval:  167 QRS Duration: 95 QT Interval:  379 QTC Calculation: 421 R Axis:   49 Text Interpretation: Sinus rhythm Confirmed by Norman ClayHong, Joshua ([id number]) on 08/27/2020 2:38:18 PM  Radiology No results found.  Procedures Procedures   Medications Ordered in ED Medications  lidocaine (LIDODERM) 5 % 1 patch (1 patch Transdermal Patch Applied 08/27/20 1455)    ED Course  I have reviewed the triage vital signs and the nursing notes.  Pertinent labs & imaging results that were available during my care of the patient were reviewed by me and considered in my medical decision making (see chart for details).    MDM Rules/Calculators/A&P                         40 year old male currently on Eliquis for bilateral PEs and DVT who presents with concern for multiple complaints, extremely anxious.  Vital signs are normal intake.  Cardiopulmonary exam is normal, abdominal exam is benign.  Neurologic exam is without focal deficit.  Skin exam did reveal erythematous patch on  the medial left distal forearm without induration, crepitus, or hematoma.  Musculoskeletal exam did reveal left-sided cervical paraspinous musculature spasm and  tenderness to palpation extends in the left trapezius.  Full range of motion of the neck.  Visual acuity is 20/40 bilaterally and when combined.  Patient is not orthostatic.  Feel patient's headaches are most consistent with tension type headache secondary to neck spasm; after further discussion with the patient, reported blurry vision is chronic for the last few months, patient does not have his reading glasses on at this time.  Lightheadedness is resolved, patient is well-appearing now.  Do not feel he warrants CT of the head given normal neurologic exam and reassuring visual acuity.  Suspect large component of patient's presentation is his anxiety, which he also reports suspicion of.  Regarding erythematous change in the left forearm, while the exact cause of this remains unclear, I do not feel there is any emergent indication for further work-up at this time.  No further work-up warranted in ED at this time given reassuring physical exam and vital signs.  Lidoderm patch offered for neck spasm, recommend Tylenol and topical analgesia as needed.  Recommend close PCP follow-up.  [place] voiced understanding with medical evaluation and treatment plan.  She was questions was answered to his expressed satisfaction.  Return precautions given.  Patient is well-appearing, stable, appropriate for discharge at this time.  This chart was dictated using voice recognition software, Dragon. Despite the best efforts of this provider to proofread and correct errors, errors may still occur which can change documentation meaning.  Final Clinical Impression(s) / ED Diagnoses Final diagnoses:  Episodic tension-type headache, not intractable    Rx / DC Orders ED Discharge Orders     None        Sherrilee Gilles 08/27/20 1536    Cheryll Cockayne, MD 08/28/20 1058

## 2020-08-27 NOTE — ED Notes (Signed)
ED Provider at bedside. 

## 2020-10-08 ENCOUNTER — Other Ambulatory Visit: Payer: Self-pay

## 2020-10-08 ENCOUNTER — Encounter: Payer: Self-pay | Admitting: Pulmonary Disease

## 2020-10-08 ENCOUNTER — Ambulatory Visit (INDEPENDENT_AMBULATORY_CARE_PROVIDER_SITE_OTHER): Payer: Medicaid Other | Admitting: Pulmonary Disease

## 2020-10-08 VITALS — BP 130/84 | HR 63 | Ht 72.0 in | Wt 308.2 lb

## 2020-10-08 DIAGNOSIS — I825Y2 Chronic embolism and thrombosis of unspecified deep veins of left proximal lower extremity: Secondary | ICD-10-CM

## 2020-10-08 NOTE — Patient Instructions (Signed)
Nice to see you  We will repeat a leg ultrasound on the left in a botu 1 month to see if clot is still present. I think it is causing your symptoms on left leg. If it is still present, we will refer to the vascular surgery or vein clinic.   Continue the blood thinner  Return in 4 months or sooner as needed

## 2020-10-26 NOTE — Progress Notes (Signed)
@Patient  ID: , male    DOB: [birth year], 40 y.o.   MRN: [id number]  Chief Complaint  Patient presents with   Hospitalization Follow-up    Was seen by MH in the hospital back in July for PE. States he was doing well until a few days ago. Increased pain in left leg.     Referring provider: August, PA  HPI:   40 y.o. man whom we are seeing in hospital follow-up following unprovoked PE.  Discharge summary reviewed.  Multiple ED notes in the interim since discharge reviewed.  Today, he is doing overall well.  Breathing has improved.  He went to the ED a few times with chest discomfort.  Concern for symptoms of PE.  Admits that would drive anxiety would make things worse.  Gradually though, chest discomfort is gone away.  His breathing has improved.  His mobility is quite limited.  Driven by sciatica and low back pain.  He notes that about a week to 10 days ago his left leg started bothering him again.  A lot of pain.  Increased swelling.  No change in breathing.   Questionaires / Pulmonary Flowsheets:   ACT:  No flowsheet data found.  MMRC: No flowsheet data found.  Epworth:  No flowsheet data found.  Tests:   FENO:  No results found for: NITRICOXIDE  PFT: No flowsheet data found.  WALK:  No flowsheet data found.  Imaging: Personally reviewed and as per EMR  Lab Results: Personally reviewed CBC    Component Value Date/Time   WBC 7.6 08/20/2020 0203   RBC 5.11 08/20/2020 0203   HGB 14.2 08/20/2020 0203   HCT 43.5 08/20/2020 0203   PLT 280 08/20/2020 0203   MCV 85.1 08/20/2020 0203   MCH 27.8 08/20/2020 0203   MCHC 32.6 08/20/2020 0203   RDW 14.7 08/20/2020 0203   LYMPHSABS 3.5 08/20/2020 0203   MONOABS 0.6 08/20/2020 0203   EOSABS 0.2 08/20/2020 0203   BASOSABS 0.1 08/20/2020 0203    BMET    Component Value Date/Time   NA 138 08/20/2020 0203   K 3.3 (L) 08/20/2020 0203   CL 102 08/20/2020 0203   CO2 29 08/20/2020 0203    GLUCOSE 94 08/20/2020 0203   BUN 16 08/20/2020 0203   CREATININE 0.96 08/20/2020 0203   CALCIUM 8.9 08/20/2020 0203   GFRNONAA >60 08/20/2020 0203   GFRAA >60 10/02/2019 0441    BNP    Component Value Date/Time   BNP 7.3 08/11/2020 0008    ProBNP No results found for: PROBNP  Specialty Problems       Pulmonary Problems   Acute hypoxemic respiratory failure due to COVID-19 (HCC)   Asthma   OSA (obstructive sleep apnea)   Right lower lobe pulmonary nodule: 73mm per CT angiogram chest 08/05/2020    Allergies  Allergen Reactions   Latex Rash    Immunization History  Administered Date(s) Administered   Tdap 07/08/2011    Past Medical History:  Diagnosis Date   Anxiety    Asthma    Back pain    DDD (degenerative disc disease), lumbar    DDD (degenerative disc disease), lumbar    Difficult intubation    DVT (deep venous thrombosis) (HCC)    GERD (gastroesophageal reflux disease)    High cholesterol    Kidney calculi    Kidney stones    OSA (obstructive sleep apnea)     Tobacco History: Social History   Tobacco Use  Smoking Status Every Day   Packs/day: 1.00   Types: Cigarettes  Smokeless Tobacco Never   Ready to quit: Not Answered Counseling given: Not Answered   Continue to not smoke  Outpatient Encounter Medications as of 10/08/2020  Medication Sig   acetaminophen (TYLENOL) 325 MG tablet Take 2 tablets (650 mg total) by mouth every 6 (six) hours as needed for mild pain (or Fever >/= 101).   albuterol (VENTOLIN HFA) 108 (90 Base) MCG/ACT inhaler Inhale 2 puffs into the lungs every 6 (six) hours as needed for wheezing or shortness of breath.   apixaban (ELIQUIS) 5 MG TABS tablet Take 1 tablet (5 mg total) by mouth 2 (two) times daily.   atorvastatin (LIPITOR) 20 MG tablet Take 1 tablet (20 mg total) by mouth daily.   diclofenac Sodium (VOLTAREN) 1 % GEL Apply 4 g topically 4 (four) times daily.   Fluticasone-Umeclidin-Vilant (TRELEGY ELLIPTA) 100-62.5-25  MCG/INH AEPB Inhale 1 puff into the lungs daily.   hydrOXYzine (ATARAX/VISTARIL) 25 MG tablet Take 1 tablet (25 mg total) by mouth 3 (three) times daily as needed for anxiety.   Multiple Vitamin (MULTIVITAMIN WITH MINERALS) TABS tablet Take 1 tablet by mouth daily.   nicotine (NICODERM CQ - DOSED IN MG/24 HOURS) 21 mg/24hr patch Place 1 patch (21 mg total) onto the skin daily.   omeprazole (PRILOSEC) 40 MG capsule Take 1 capsule (40 mg total) by mouth daily.   Vitamin D, Ergocalciferol, (DRISDOL) 1.25 MG (50000 UNIT) CAPS capsule Take 50,000 Units by mouth once a week.   [DISCONTINUED] APIXABAN (ELIQUIS) VTE STARTER PACK (10MG  AND 5MG ) Take as directed on package: start with two-5mg  tablets twice daily for 7 days. On day 8, switch to one-5mg  tablet twice daily.   [DISCONTINUED] Cimetidine (HEARTBURN RELIEF PO) Take 1 tablet by mouth daily as needed (heartburn).   [DISCONTINUED] hydrochlorothiazide (HYDRODIURIL) 25 MG tablet Take 25 mg by mouth daily.   No facility-administered encounter medications on file as of 10/08/2020.     Review of Systems  Review of Systems  N/A Physical Exam  BP 130/84   Pulse 63   Ht 6' (1.829 m)   Wt (!) 308 lb 3.2 oz (139.8 kg)   SpO2 98% Comment: on RA  BMI 41.80 kg/m   Wt Readings from Last 5 Encounters:  10/08/20 (!) 308 lb 3.2 oz (139.8 kg)  08/27/20 295 lb (133.8 kg)  08/20/20 297 lb 9.9 oz (135 kg)  08/10/20 298 lb (135.2 kg)  08/07/20 293 lb 3.4 oz (133 kg)    BMI Readings from Last 5 Encounters:  10/08/20 41.80 kg/m  08/27/20 40.01 kg/m  08/20/20 40.36 kg/m  08/10/20 40.42 kg/m  08/07/20 39.77 kg/m     Physical Exam General: Sitting in chair, no acute distress Eyes: EOMI, icterus Cardiovascular: Regular in rhythm, no murmur Pulmonary: Distant, clear, no work of breathing Extremities: Left greater than right swelling or increased circumference, mild erythema   Assessment & Plan:   Pulmonary embolus: Unprovoked.  Less active  but would walk around the house.  Obesity also a risk factor.  Recommend lifelong anticoagulation.  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  Classic description.  We will repeat PVLs.  Referral to vascular surgery if clot still present.   Return in about 4 months (around 02/07/2021).   10/08/20, MD 10/26/2020   This appointment required 45 minutes of patient care (this includes precharting, chart review, review of results, face-to-face care, etc.).

## 2020-11-09 ENCOUNTER — Ambulatory Visit (HOSPITAL_BASED_OUTPATIENT_CLINIC_OR_DEPARTMENT_OTHER): Admission: RE | Admit: 2020-11-09 | Payer: Medicaid Other | Source: Ambulatory Visit

## 2020-12-01 ENCOUNTER — Encounter (HOSPITAL_BASED_OUTPATIENT_CLINIC_OR_DEPARTMENT_OTHER): Payer: Self-pay | Admitting: Emergency Medicine

## 2020-12-01 ENCOUNTER — Emergency Department (HOSPITAL_BASED_OUTPATIENT_CLINIC_OR_DEPARTMENT_OTHER)
Admission: EM | Admit: 2020-12-01 | Discharge: 2020-12-01 | Disposition: A | Discharge status: Home / Self Care | Payer: Medicaid Other | Attending: Emergency Medicine | Admitting: Emergency Medicine

## 2020-12-01 ENCOUNTER — Other Ambulatory Visit: Payer: Self-pay

## 2020-12-01 DIAGNOSIS — Z7901 Long term (current) use of anticoagulants: Secondary | ICD-10-CM | POA: Insufficient documentation

## 2020-12-01 DIAGNOSIS — Z7952 Long term (current) use of systemic steroids: Secondary | ICD-10-CM | POA: Diagnosis not present

## 2020-12-01 DIAGNOSIS — Z91040 Latex allergy status: Secondary | ICD-10-CM | POA: Diagnosis not present

## 2020-12-01 DIAGNOSIS — J45909 Unspecified asthma, uncomplicated: Secondary | ICD-10-CM | POA: Insufficient documentation

## 2020-12-01 DIAGNOSIS — F17210 Nicotine dependence, cigarettes, uncomplicated: Secondary | ICD-10-CM | POA: Diagnosis not present

## 2020-12-01 DIAGNOSIS — L02214 Cutaneous abscess of groin: Secondary | ICD-10-CM | POA: Diagnosis present

## 2020-12-01 MED ORDER — LIDOCAINE-EPINEPHRINE 2 %-1:100000 IJ SOLN: 20.0000 mL | Freq: Once | INTRAMUSCULAR | Status: DC

## 2020-12-01 MED ORDER — LIDOCAINE-EPINEPHRINE (PF) 2 %-1:200000 IJ SOLN
INTRAMUSCULAR | Status: AC
  Administered 2020-12-01: 20 mL
  Filled 2020-12-01: qty 20

## 2020-12-01 MED ORDER — OXYCODONE-ACETAMINOPHEN 5-325 MG PO TABS
1.0000 | ORAL_TABLET | Freq: Once | ORAL | Status: AC
  Administered 2020-12-01: 1 via ORAL
  Filled 2020-12-01: qty 1

## 2020-12-01 NOTE — ED Triage Notes (Signed)
Patient arrived via POV c/o abscess to right inguinal area. Abscess appears to be approximately 2 inches long x 1 inch wide. Patient states 8/10 pain. Patient states pain woke him from sleep. Patient is AO x 4, VS WDL, normal gait.

## 2020-12-01 NOTE — ED Provider Notes (Signed)
[facility] Provider Note: Lowella Dell, MD, FACEP  CSN: [id number] MRN: [id number] ARRIVAL: 12/01/20 at 0614 ROOM: MH09/[facility]09   CHIEF COMPLAINT  Abscess   HISTORY OF PRESENT ILLNESS  12/01/20 6:39 AM Skylen Spiering is a 40 y.o. male who noticed a small bump in his right inguinal region about 2 weeks ago.  This has been fairly indolent but since yesterday increased in size significantly.  This morning he awakened with severe pain at the site.  He rates his pain as an 8 out of 10, worse with movement or palpation.  The lesion is erythematous with and ecchymotic center.  It is fluctuant with surrounding induration.  He denies fever or chills.  He is on Eliquis for thromboembolic disease.   Past Medical History:  Diagnosis Date   Anxiety    Asthma    Back pain    DDD (degenerative disc disease), lumbar    DDD (degenerative disc disease), lumbar    Difficult intubation    DVT (deep venous thrombosis) (HCC)    GERD (gastroesophageal reflux disease)    High cholesterol    Kidney calculi    Kidney stones    OSA (obstructive sleep apnea)     Past Surgical History:  Procedure Laterality Date   CYSTOSCOPY W/ URETERAL STENT PLACEMENT  12/18/2010   Procedure: CYSTOSCOPY WITH RETROGRADE PYELOGRAM/URETERAL STENT PLACEMENT;  Surgeon: Lindaann Slough, MD;  Location: [facility];  Service: Urology;  Laterality: Left;  left ureteroscopy   LITHOTRIPSY      No family history on file.  Social History   Tobacco Use   Smoking status: Every Day    Packs/day: 1.00    Types: Cigarettes   Smokeless tobacco: Never  Vaping Use   Vaping Use: Some days  Substance Use Topics   Alcohol use: Yes    Comment: occasionally   Drug use: Yes    Prior to Admission medications   Medication Sig Start Date End Date Taking? Authorizing Provider  acetaminophen (TYLENOL) 325 MG tablet Take 2 tablets (650 mg total) by mouth every 6 (six) hours as needed for mild pain (or Fever >/= 101).  08/10/20   Rodolph Bong, MD  albuterol (VENTOLIN HFA) 108 (90 Base) MCG/ACT inhaler Inhale 2 puffs into the lungs every 6 (six) hours as needed for wheezing or shortness of breath. 08/10/20   Rodolph Bong, MD  apixaban (ELIQUIS) 5 MG TABS tablet Take 1 tablet (5 mg total) by mouth 2 (two) times daily. 09/09/20   Rodolph Bong, MD  atorvastatin (LIPITOR) 20 MG tablet Take 1 tablet (20 mg total) by mouth daily. 08/10/20   Rodolph Bong, MD  diclofenac Sodium (VOLTAREN) 1 % GEL Apply 4 g topically 4 (four) times daily. 08/05/20   Palumbo, April, MD  Fluticasone-Umeclidin-Vilant (TRELEGY ELLIPTA) 100-62.5-25 MCG/INH AEPB Inhale 1 puff into the lungs daily. 08/10/20   Rodolph Bong, MD  hydrOXYzine (ATARAX/VISTARIL) 25 MG tablet Take 1 tablet (25 mg total) by mouth 3 (three) times daily as needed for anxiety. 08/10/20   Rodolph Bong, MD  Multiple Vitamin (MULTIVITAMIN WITH MINERALS) TABS tablet Take 1 tablet by mouth daily.    [provider]  nicotine (NICODERM CQ - DOSED IN MG/24 HOURS) 21 mg/24hr patch Place 1 patch (21 mg total) onto the skin daily. 08/11/20   Rodolph Bong, MD  omeprazole (PRILOSEC) 40 MG capsule Take 1 capsule (40 mg total) by mouth daily. 08/10/20   Rodolph Bong, MD  Vitamin D, Ergocalciferol, (DRISDOL) 1.25 MG (50000 UNIT) CAPS capsule Take 50,000 Units by mouth once a week. 07/29/20   [provider]    Allergies Latex   REVIEW OF SYSTEMS  Negative except as noted here or in the History of Present Illness.   PHYSICAL EXAMINATION  Initial Vital Signs Blood pressure (!) 143/81, pulse 85, temperature 98.5 F (36.9 C), temperature source Oral, resp. rate 20, height 6' (1.829 m), weight (!) 144.7 kg, SpO2 95 %.  Examination General: Well-developed, well-nourished male in no acute distress; appearance consistent with age of record HENT: normocephalic; atraumatic Eyes: Normal appearance Neck: supple Heart: regular rate  and rhythm Lungs: clear to auscultation bilaterally Abdomen: soft; nondistended; nontender; bowel sounds present Extremities: No deformity; full range of motion Neurologic: Awake, alert and oriented; motor function intact in all extremities and symmetric; no facial droop Skin: Warm and dry; tender, fluctuant mass right inguinal fold with central ecchymosis and surrounding induration:    Psychiatric: Normal mood and affect   RESULTS  Summary of this visit's results, reviewed and interpreted by myself:   EKG Interpretation  Date/Time:    Ventricular Rate:    PR Interval:    QRS Duration:   QT Interval:    QTC Calculation:   R Axis:     Text Interpretation:         Laboratory Studies: No results found for this or any previous visit (from the past 24 hour(s)). Imaging Studies: No results found.  ED COURSE and MDM  Nursing notes, initial and subsequent vitals signs, including pulse oximetry, reviewed and interpreted by myself.  Vitals:   12/01/20 0624 12/01/20 0625  BP: (!) 143/81   Pulse: 85   Resp: 20   Temp: 98.5 F (36.9 C)   TempSrc: Oral   SpO2: 95%   Weight:  (!) 144.7 kg  Height:  6' (1.829 m)   Medications  lidocaine-EPINEPHrine (XYLOCAINE W/EPI) 2 %-1:100000 (with pres) injection 20 mL (has no administration in time range)  oxyCODONE-acetaminophen (PERCOCET/ROXICET) 5-325 MG per tablet 1 tablet (has no administration in time range)  lidocaine-EPINEPHrine (XYLOCAINE W/EPI) 2 %-1:200000 (PF) injection (20 mLs  Given by Other 12/01/20 0645)    I&D was performed as described below.  Profuse, foul smelling pus was obtained.  This was sent for culture.  PROCEDURES  Procedures INCISION AND DRAINAGE Performed by: Carlisle Beers Jamont Mellin Consent: Verbal consent obtained. Risks and benefits: risks, benefits and alternatives were discussed Type: abscess  Body area: Right inguinal fold  Anesthesia: local infiltration  Incision was made with a scalpel.  Local  anesthetic: lidocaine 2% with epinephrine  Anesthetic total: 3 ml  Complexity: complex Blunt dissection to break up loculations  Drainage: purulent, foul-smelling, with a few clots  Drainage amount: Copious  Packing material: None  Patient tolerance: Patient tolerated the procedure well with no immediate complications.   ED DIAGNOSES     ICD-10-CM   1. Abscess of right groin  L02.214          Paula Libra, MD 12/01/20 [phone]

## 2020-12-06 LAB — AEROBIC/ANAEROBIC CULTURE W GRAM STAIN (SURGICAL/DEEP WOUND)

## 2022-01-25 ENCOUNTER — Other Ambulatory Visit: Payer: Self-pay

## 2022-01-25 ENCOUNTER — Emergency Department (HOSPITAL_BASED_OUTPATIENT_CLINIC_OR_DEPARTMENT_OTHER)
Admission: EM | Admit: 2022-01-25 | Discharge: 2022-01-25 | Disposition: A | Discharge status: Home / Self Care | Payer: Medicaid Other | Attending: Emergency Medicine | Admitting: Emergency Medicine

## 2022-01-25 ENCOUNTER — Encounter (HOSPITAL_BASED_OUTPATIENT_CLINIC_OR_DEPARTMENT_OTHER): Payer: Self-pay | Admitting: Emergency Medicine

## 2022-01-25 DIAGNOSIS — Z91040 Latex allergy status: Secondary | ICD-10-CM | POA: Diagnosis not present

## 2022-01-25 DIAGNOSIS — G894 Chronic pain syndrome: Secondary | ICD-10-CM

## 2022-01-25 DIAGNOSIS — Z7901 Long term (current) use of anticoagulants: Secondary | ICD-10-CM | POA: Insufficient documentation

## 2022-01-25 DIAGNOSIS — M549 Dorsalgia, unspecified: Secondary | ICD-10-CM | POA: Diagnosis present

## 2022-01-25 MED ORDER — OXYCODONE-ACETAMINOPHEN 5-325 MG PO TABS
2.0000 | ORAL_TABLET | Freq: Once | ORAL | Status: AC
  Administered 2022-01-25: 2 via ORAL
  Filled 2022-01-25: qty 2

## 2022-01-25 NOTE — ED Triage Notes (Signed)
Pt states needs both hip replaced. PCP is on vacation and pt has ran out of meds. Pain is causing nausea and migraine.

## 2022-01-25 NOTE — ED Provider Notes (Signed)
[place] EMERGENCY DEPARTMENT Provider Note   CSN: [id number] Arrival date & time: 01/25/22  [id number]     History  Chief Complaint  Patient presents with   Hip Pain    Frank Robinson is a 41 y.o. male.  The history is provided by the patient.  Hip Pain   He has history of chronic hip and chronic back pain for which he takes hydrocodone-acetaminophen on a daily basis.  He states that he ran out of his medication about 3 days ago and has been in severe pain since then.  He has been taking acetaminophen for pain.  He is unable to take NSAIDs because he is chronically anticoagulated because of history of DVT.  He takes apixaban.  Home Medications Prior to Admission medications   Medication Sig Start Date End Date Taking? Authorizing Provider  acetaminophen (TYLENOL) 325 MG tablet Take 2 tablets (650 mg total) by mouth every 6 (six) hours as needed for mild pain (or Fever >/= 101). 08/10/20   Eugenie Filler, MD  albuterol (VENTOLIN HFA) 108 (90 Base) MCG/ACT inhaler Inhale 2 puffs into the lungs every 6 (six) hours as needed for wheezing or shortness of breath. 08/10/20   Eugenie Filler, MD  apixaban (ELIQUIS) 5 MG TABS tablet Take 1 tablet (5 mg total) by mouth 2 (two) times daily. 09/09/20   Eugenie Filler, MD  atorvastatin (LIPITOR) 20 MG tablet Take 1 tablet (20 mg total) by mouth daily. 08/10/20   Eugenie Filler, MD  diclofenac Sodium (VOLTAREN) 1 % GEL Apply 4 g topically 4 (four) times daily. 08/05/20   Palumbo, April, MD  Fluticasone-Umeclidin-Vilant (TRELEGY ELLIPTA) 100-62.5-25 MCG/INH AEPB Inhale 1 puff into the lungs daily. 08/10/20   Eugenie Filler, MD  hydrOXYzine (ATARAX/VISTARIL) 25 MG tablet Take 1 tablet (25 mg total) by mouth 3 (three) times daily as needed for anxiety. 08/10/20   Eugenie Filler, MD  Multiple Vitamin (MULTIVITAMIN WITH MINERALS) TABS tablet Take 1 tablet by mouth daily.    [provider]  nicotine (NICODERM CQ -  DOSED IN MG/24 HOURS) 21 mg/24hr patch Place 1 patch (21 mg total) onto the skin daily. 08/11/20   Eugenie Filler, MD  omeprazole (PRILOSEC) 40 MG capsule Take 1 capsule (40 mg total) by mouth daily. 08/10/20   Eugenie Filler, MD  Vitamin D, Ergocalciferol, (DRISDOL) 1.25 MG (50000 UNIT) CAPS capsule Take 50,000 Units by mouth once a week. 07/29/20   [provider]      Allergies    Latex    Review of Systems   Review of Systems  All other systems reviewed and are negative.   Physical Exam Updated Vital Signs BP (!) 143/81 (BP Location: Right Arm)   Pulse 73   Temp 98.5 F (36.9 C) (Oral)   Resp 20   Ht 6' (1.829 m)   Wt 134.7 kg   SpO2 97%   BMI 40.28 kg/m  Physical Exam Vitals and nursing note reviewed.   41 year old male, resting comfortably and in no acute distress. Vital signs are significant for borderline elevated blood pressure. Oxygen saturation is 97%, which is normal. Head is normocephalic and atraumatic. PERRLA, EOMI.  Neck is nontender and supple. Back is nontender and there is no CVA tenderness. Lungs are clear without rales, wheezes, or rhonchi. Chest is nontender. Heart has regular rate and rhythm without murmur. Abdomen is soft, flat, nontender. Extremities have no cyanosis or edema.  There is  pain with passive range of motion of both hips. Skin is warm and dry without rash. Neurologic: Mental status is normal, cranial nerves are intact, moves all extremities equally.  ED Results / Procedures / Treatments    Procedures Procedures    Medications Ordered in ED Medications  oxyCODONE-acetaminophen (PERCOCET/ROXICET) 5-325 MG per tablet 2 tablet (has no administration in time range)    ED Course/ Medical Decision Making/ A&P                           Medical Decision Making Risk Prescription drug management.   Chronic hip and back pain.  I reviewed his record on the [place] controlled substance reporting website and he did  have a prescription for 90 hydrocodone-acetaminophen 5-325 filled on December 7.  Clearly he has been taking the pain medication more often than prescribed.  I have informed him that I cannot write a prescription for narcotic pain medication, that he would have to get that through his pain management provider, but I have ordered a dose of oxycodone-acetaminophen to give him some temporary pain relief.  Final Clinical Impression(s) / ED Diagnoses Final diagnoses:  Chronic pain syndrome  Chronic anticoagulation    Rx / DC Orders ED Discharge Orders     None         Dione Booze, MD 01/25/22 [phone]

## 2022-01-25 NOTE — Discharge Instructions (Addendum)
You will need to work with your pain management provider to get additional narcotic pain medication.

## 2022-03-30 ENCOUNTER — Emergency Department (HOSPITAL_BASED_OUTPATIENT_CLINIC_OR_DEPARTMENT_OTHER)
Admission: EM | Admit: 2022-03-30 | Discharge: 2022-03-30 | Disposition: A | Discharge status: Home / Self Care | Payer: Medicaid Other | Attending: Emergency Medicine | Admitting: Emergency Medicine

## 2022-03-30 ENCOUNTER — Encounter (HOSPITAL_BASED_OUTPATIENT_CLINIC_OR_DEPARTMENT_OTHER): Payer: Self-pay | Admitting: Emergency Medicine

## 2022-03-30 ENCOUNTER — Other Ambulatory Visit: Payer: Self-pay

## 2022-03-30 DIAGNOSIS — Z91040 Latex allergy status: Secondary | ICD-10-CM | POA: Insufficient documentation

## 2022-03-30 DIAGNOSIS — Z7901 Long term (current) use of anticoagulants: Secondary | ICD-10-CM | POA: Insufficient documentation

## 2022-03-30 DIAGNOSIS — L0291 Cutaneous abscess, unspecified: Secondary | ICD-10-CM

## 2022-03-30 DIAGNOSIS — L02214 Cutaneous abscess of groin: Secondary | ICD-10-CM | POA: Diagnosis present

## 2022-03-30 DIAGNOSIS — Z23 Encounter for immunization: Secondary | ICD-10-CM | POA: Insufficient documentation

## 2022-03-30 MED ORDER — TETANUS-DIPHTH-ACELL PERTUSSIS 5-2.5-18.5 LF-MCG/0.5 IM SUSY
0.5000 mL | PREFILLED_SYRINGE | Freq: Once | INTRAMUSCULAR | Status: AC
  Administered 2022-03-30: 0.5 mL via INTRAMUSCULAR
  Filled 2022-03-30: qty 0.5

## 2022-03-30 MED ORDER — LIDOCAINE-EPINEPHRINE (PF) 2 %-1:200000 IJ SOLN
10.0000 mL | Freq: Once | INTRAMUSCULAR | Status: AC
  Administered 2022-03-30: 10 mL
  Filled 2022-03-30: qty 20

## 2022-03-30 MED ORDER — DOXYCYCLINE HYCLATE 100 MG PO CAPS: 100.0000 mg | ORAL_CAPSULE | Freq: Two times a day (BID) | ORAL | 0 refills | Status: DC

## 2022-03-30 MED ORDER — DOXYCYCLINE HYCLATE 100 MG PO TABS
100.0000 mg | ORAL_TABLET | Freq: Once | ORAL | Status: AC
  Administered 2022-03-30: 100 mg via ORAL
  Filled 2022-03-30: qty 1

## 2022-03-30 NOTE — Discharge Instructions (Addendum)
Evaluation of your right groin did reveal an abscess.  I&D procedure went well.  Advised that you continue doxycycline.  I have sent the remainder of your course to your pharmacy.  Also recommend he follow-up with your PCP in 3 to 4 days for reevaluation of the wound.

## 2022-03-30 NOTE — ED Notes (Signed)
Discharge paperwork reviewed entirely with patient, including Rx's and follow up care. Pain was under control. Pt verbalized understanding as well as all parties involved. No questions or concerns voiced at the time of discharge. No acute distress noted.   Pt ambulated out to PVA without incident or assistance.

## 2022-03-30 NOTE — ED Provider Notes (Signed)
[facility] Provider Note   CSN: [id number] Arrival date & time: 03/30/22  1532     History  Chief Complaint  Patient presents with   Abscess   HPI Frank Robinson is a 42 y.o. male with PMH of DVT on Eliquis and multiple abscesses presenting for abscess.  Noticed what appeared to be an abscess in his right growing 4 days ago.  States he has had an abscess in that location in the past.  States the abscess is tender and becoming more purple in color.  States there has been no oozing or bleeding but the swelling pain and discoloration is getting worse.  States that the area around the abscess is also swollen and getting worse.   Abscess      Home Medications Prior to Admission medications   Medication Sig Start Date End Date Taking? Authorizing Provider  acetaminophen (TYLENOL) 325 MG tablet Take 2 tablets (650 mg total) by mouth every 6 (six) hours as needed for mild pain (or Fever >/= 101). 08/10/20   Eugenie Filler, MD  albuterol (VENTOLIN HFA) 108 (90 Base) MCG/ACT inhaler Inhale 2 puffs into the lungs every 6 (six) hours as needed for wheezing or shortness of breath. 08/10/20   Eugenie Filler, MD  apixaban (ELIQUIS) 5 MG TABS tablet Take 1 tablet (5 mg total) by mouth 2 (two) times daily. 09/09/20   Eugenie Filler, MD  atorvastatin (LIPITOR) 20 MG tablet Take 1 tablet (20 mg total) by mouth daily. 08/10/20   Eugenie Filler, MD  diclofenac Sodium (VOLTAREN) 1 % GEL Apply 4 g topically 4 (four) times daily. 08/05/20   Palumbo, April, MD  Fluticasone-Umeclidin-Vilant (TRELEGY ELLIPTA) 100-62.5-25 MCG/INH AEPB Inhale 1 puff into the lungs daily. 08/10/20   Eugenie Filler, MD  hydrOXYzine (ATARAX/VISTARIL) 25 MG tablet Take 1 tablet (25 mg total) by mouth 3 (three) times daily as needed for anxiety. 08/10/20   Eugenie Filler, MD  Multiple Vitamin (MULTIVITAMIN WITH MINERALS) TABS tablet Take 1 tablet by mouth daily.     [provider]  nicotine (NICODERM CQ - DOSED IN MG/24 HOURS) 21 mg/24hr patch Place 1 patch (21 mg total) onto the skin daily. 08/11/20   Eugenie Filler, MD  omeprazole (PRILOSEC) 40 MG capsule Take 1 capsule (40 mg total) by mouth daily. 08/10/20   Eugenie Filler, MD  Vitamin D, Ergocalciferol, (DRISDOL) 1.25 MG (50000 UNIT) CAPS capsule Take 50,000 Units by mouth once a week. 07/29/20   [provider]      Allergies    Latex    Review of Systems   Review of Systems  Skin:        Right groin abscess    Physical Exam Updated Vital Signs BP (!) 140/85 (BP Location: Left Arm)   Pulse 82   Temp 98 F (36.7 C) (Oral)   Resp 18   Ht 6' (1.829 m)   Wt 135.2 kg   SpO2 97%   BMI 40.42 kg/m  Physical Exam Constitutional:      Appearance: Normal appearance.  HENT:     Head: Normocephalic.     Nose: Nose normal.  Eyes:     Conjunctiva/sclera: Conjunctivae normal.  Pulmonary:     Effort: Pulmonary effort is normal.  Skin:         Comments:  Inspected with ultrasound which revealed area of well-circumscribed, circular and hypoechoic region approximately 2 centimeters in diameter  Neurological:     Mental Status: He is alert.  Psychiatric:        Mood and Affect: Mood normal.     ED Results / Procedures / Treatments   Labs (all labs ordered are listed, but only abnormal results are displayed) Labs Reviewed - No data to display  EKG None  Radiology No results found.  Procedures .Marland KitchenIncision and Drainage  Date/Time: 03/30/2022 6:41 PM  Performed by: Harriet Pho, PA-C Authorized by: Harriet Pho, PA-C   Consent:    Consent obtained:  Verbal   Consent given by:  Patient   Risks discussed:  Bleeding and incomplete drainage   Alternatives discussed:  No treatment Universal protocol:    Procedure explained and questions answered to patient or proxy's satisfaction: yes     Relevant documents present and verified: yes     Patient  identity confirmed:  Verbally with patient and arm band Location:    Type:  Abscess   Size:  2 cm   Location: right groin. Pre-procedure details:    Skin preparation:  Povidone-iodine Anesthesia:    Anesthesia method:  Local infiltration   Local anesthetic:  Lidocaine 2% WITH epi Procedure type:    Complexity:  Simple Procedure details:    Ultrasound guidance: no     Needle aspiration: no     Incision types:  Stab incision   Incision depth:  Dermal   Wound management:  Probed and deloculated   Drainage:  Bloody and purulent   Drainage amount:  Moderate   Wound treatment:  Wound left open   Packing materials:  1/4 in iodoform gauze Post-procedure details:    Procedure completion:  Tolerated well, no immediate complications     Medications Ordered in ED Medications  lidocaine-EPINEPHrine (XYLOCAINE W/EPI) 2 %-1:200000 (PF) injection 10 mL (10 mLs Infiltration Given by Other 03/30/22 1808)  Tdap (BOOSTRIX) injection 0.5 mL (0.5 mLs Intramuscular Given 03/30/22 1808)  doxycycline (VIBRA-TABS) tablet 100 mg (100 mg Oral Given 03/30/22 1842)    ED Course/ Medical Decision Making/ A&P                             Medical Decision Making Risk Prescription drug management.   42 year old male who is well-appearing and hemodynamically stable presenting for concern for abscess.  Physical exam was notab Gave Tdap booster. le for likely abscess in the right groin.  I&D procedure was well-tolerated.  Able to express approximately 3 to 4 mL of pustulant sanguinous fluid.  Treated empirically for associated infection with doxycycline.  Discussed appropriate wound care at home.  Advised to follow-up with his PCP.  Sent the remainder of his doxycycline to his pharmacy.        Final Clinical Impression(s) / ED Diagnoses Final diagnoses:  Abscess    Rx / DC Orders ED Discharge Orders     None         Harriet Pho, PA-C 03/30/22 1844    Drenda Freeze, MD 03/30/22  [phone]

## 2022-03-30 NOTE — ED Triage Notes (Signed)
Pt reports several abscesses to RT groin area

## 2022-05-30 ENCOUNTER — Other Ambulatory Visit (HOSPITAL_BASED_OUTPATIENT_CLINIC_OR_DEPARTMENT_OTHER): Payer: Self-pay

## 2022-05-30 MED ORDER — WEGOVY 0.25 MG/0.5ML ~~LOC~~ SOAJ
0.2500 mg | SUBCUTANEOUS | 0 refills | Status: AC
  Filled 2022-05-30: qty 2, 28d supply, fill #0

## 2022-05-31 ENCOUNTER — Other Ambulatory Visit (HOSPITAL_BASED_OUTPATIENT_CLINIC_OR_DEPARTMENT_OTHER): Payer: Self-pay

## 2022-06-01 ENCOUNTER — Other Ambulatory Visit (HOSPITAL_BASED_OUTPATIENT_CLINIC_OR_DEPARTMENT_OTHER): Payer: Self-pay

## 2022-06-02 ENCOUNTER — Other Ambulatory Visit (HOSPITAL_BASED_OUTPATIENT_CLINIC_OR_DEPARTMENT_OTHER): Payer: Self-pay

## 2022-06-03 ENCOUNTER — Other Ambulatory Visit (HOSPITAL_BASED_OUTPATIENT_CLINIC_OR_DEPARTMENT_OTHER): Payer: Self-pay

## 2022-06-06 ENCOUNTER — Other Ambulatory Visit (HOSPITAL_BASED_OUTPATIENT_CLINIC_OR_DEPARTMENT_OTHER): Payer: Self-pay

## 2022-06-07 ENCOUNTER — Other Ambulatory Visit (HOSPITAL_BASED_OUTPATIENT_CLINIC_OR_DEPARTMENT_OTHER): Payer: Self-pay

## 2022-06-09 ENCOUNTER — Other Ambulatory Visit (HOSPITAL_BASED_OUTPATIENT_CLINIC_OR_DEPARTMENT_OTHER): Payer: Self-pay

## 2022-06-13 ENCOUNTER — Other Ambulatory Visit (HOSPITAL_BASED_OUTPATIENT_CLINIC_OR_DEPARTMENT_OTHER): Payer: Self-pay

## 2022-06-14 ENCOUNTER — Other Ambulatory Visit (HOSPITAL_BASED_OUTPATIENT_CLINIC_OR_DEPARTMENT_OTHER): Payer: Self-pay

## 2022-06-16 ENCOUNTER — Other Ambulatory Visit (HOSPITAL_BASED_OUTPATIENT_CLINIC_OR_DEPARTMENT_OTHER): Payer: Self-pay

## 2022-09-07 ENCOUNTER — Other Ambulatory Visit: Payer: Self-pay

## 2022-09-07 ENCOUNTER — Encounter (HOSPITAL_BASED_OUTPATIENT_CLINIC_OR_DEPARTMENT_OTHER): Payer: Self-pay

## 2022-09-07 DIAGNOSIS — Z91040 Latex allergy status: Secondary | ICD-10-CM | POA: Diagnosis not present

## 2022-09-07 DIAGNOSIS — Z7901 Long term (current) use of anticoagulants: Secondary | ICD-10-CM | POA: Diagnosis not present

## 2022-09-07 DIAGNOSIS — L02416 Cutaneous abscess of left lower limb: Secondary | ICD-10-CM | POA: Insufficient documentation

## 2022-09-07 NOTE — ED Triage Notes (Signed)
Pt reports abscess to left groin pain Pt has hx of same Noticed it 4 days ago Area is not draining

## 2022-09-08 ENCOUNTER — Emergency Department (HOSPITAL_BASED_OUTPATIENT_CLINIC_OR_DEPARTMENT_OTHER)
Admission: EM | Admit: 2022-09-08 | Discharge: 2022-09-08 | Disposition: A | Discharge status: Home / Self Care | Payer: Medicaid Other | Attending: Emergency Medicine | Admitting: Emergency Medicine

## 2022-09-08 DIAGNOSIS — Z7901 Long term (current) use of anticoagulants: Secondary | ICD-10-CM

## 2022-09-08 DIAGNOSIS — L02416 Cutaneous abscess of left lower limb: Secondary | ICD-10-CM

## 2022-09-08 MED ORDER — DOXYCYCLINE HYCLATE 100 MG PO TABS
100.0000 mg | ORAL_TABLET | Freq: Once | ORAL | Status: AC
  Administered 2022-09-08: 100 mg via ORAL
  Filled 2022-09-08: qty 1

## 2022-09-08 MED ORDER — DOXYCYCLINE HYCLATE 100 MG PO CAPS: 100.0000 mg | ORAL_CAPSULE | Freq: Two times a day (BID) | ORAL | 0 refills | Status: AC

## 2022-09-08 MED ORDER — LIDOCAINE-EPINEPHRINE (PF) 1 %-1:200000 IJ SOLN
20.0000 mL | Freq: Once | INTRAMUSCULAR | Status: AC
  Administered 2022-09-08: 20 mL
  Filled 2022-09-08: qty 30

## 2022-09-08 NOTE — ED Provider Notes (Signed)
[facility] Provider Note   CSN: [id number] Arrival date & time: 09/07/22  2315     History  Chief Complaint  Patient presents with   Abscess    Sabastain Sidor is a 42 y.o. male.  The history is provided by the patient.  Abscess He has history of DVT anticoagulated on apixaban, multiple abscesses and comes in with pain and swelling of the proximal left medial thigh for the last 3 days.  This is the location of previous abscesses, and he states that has had incision and drainage 3 times.  He denies fever, chills, sweats.   Home Medications Prior to Admission medications   Medication Sig Start Date End Date Taking? Authorizing Provider  acetaminophen (TYLENOL) 325 MG tablet Take 2 tablets (650 mg total) by mouth every 6 (six) hours as needed for mild pain (or Fever >/= 101). 08/10/20   Rodolph Bong, MD  albuterol (VENTOLIN HFA) 108 (90 Base) MCG/ACT inhaler Inhale 2 puffs into the lungs every 6 (six) hours as needed for wheezing or shortness of breath. 08/10/20   Rodolph Bong, MD  apixaban (ELIQUIS) 5 MG TABS tablet Take 1 tablet (5 mg total) by mouth 2 (two) times daily. 09/09/20   Rodolph Bong, MD  atorvastatin (LIPITOR) 20 MG tablet Take 1 tablet (20 mg total) by mouth daily. 08/10/20   Rodolph Bong, MD  diclofenac Sodium (VOLTAREN) 1 % GEL Apply 4 g topically 4 (four) times daily. 08/05/20   Palumbo, April, MD  doxycycline (VIBRAMYCIN) 100 MG capsule Take 1 capsule (100 mg total) by mouth 2 (two) times daily. 03/30/22   Gareth Eagle, PA-C  Fluticasone-Umeclidin-Vilant (TRELEGY ELLIPTA) 100-62.5-25 MCG/INH AEPB Inhale 1 puff into the lungs daily. 08/10/20   Rodolph Bong, MD  hydrOXYzine (ATARAX/VISTARIL) 25 MG tablet Take 1 tablet (25 mg total) by mouth 3 (three) times daily as needed for anxiety. 08/10/20   Rodolph Bong, MD  Multiple Vitamin (MULTIVITAMIN WITH MINERALS) TABS tablet Take 1 tablet by mouth  daily.    [provider]  nicotine (NICODERM CQ - DOSED IN MG/24 HOURS) 21 mg/24hr patch Place 1 patch (21 mg total) onto the skin daily. 08/11/20   Rodolph Bong, MD  omeprazole (PRILOSEC) 40 MG capsule Take 1 capsule (40 mg total) by mouth daily. 08/10/20   Rodolph Bong, MD  Semaglutide-Weight Management (WEGOVY) 0.25 MG/0.5ML SOAJ Inject 0.25 mg into the skin once a week. 05/30/22     Vitamin D, Ergocalciferol, (DRISDOL) 1.25 MG (50000 UNIT) CAPS capsule Take 50,000 Units by mouth once a week. 07/29/20   [provider]      Allergies    Latex    Review of Systems   Review of Systems  All other systems reviewed and are negative.   Physical Exam Updated Vital Signs BP 138/75 (BP Location: Left Arm)   Pulse 90   Temp 98.4 F (36.9 C)   Resp 18   Ht 6' (1.829 m)   Wt (!) 137 kg   SpO2 97%   BMI 40.96 kg/m  Physical Exam Vitals and nursing note reviewed.   42 year old male, resting comfortably and in no acute distress. Vital signs are normal. Oxygen saturation is 97%, which is normal. Head is normocephalic and atraumatic. PERRLA, EOMI. Lungs are clear without rales, wheezes, or rhonchi. Chest is nontender. Heart has regular rate and rhythm without murmur. Abdomen is soft, flat, nontender. Extremities: Erythematous  and slightly fluctuant and tender area on the proximal left medial thigh measuring 7 cm x 5 cm consistent with abscess. Skin is warm and dry without other rash. Neurologic: Mental status is normal, cranial nerves are intact, moves all extremities equally.  ED Results / Procedures / Treatments    Procedures .Marland KitchenIncision and Drainage  Date/Time: 09/08/2022 3:13 AM  Performed by: Dione Booze, MD Authorized by: Dione Booze, MD   Consent:    Consent obtained:  Verbal   Consent given by:  Patient   Risks, benefits, and alternatives were discussed: yes     Risks discussed:  Bleeding, incomplete drainage and pain   Alternatives discussed:   No treatment Universal protocol:    Procedure explained and questions answered to patient or proxy's satisfaction: yes     Relevant documents present and verified: yes     Required blood products, implants, devices, and special equipment available: yes     Site/side marked: yes     Immediately prior to procedure, a time out was called: yes     Patient identity confirmed:  Verbally with patient and arm band Location:    Type:  Abscess   Size:  5x7.5 cm   Location:  Lower extremity   Lower extremity location:  Leg   Leg location:  L upper leg Pre-procedure details:    Skin preparation:  Antiseptic wash Sedation:    Sedation type:  None Anesthesia:    Anesthesia method:  Local infiltration   Local anesthetic:  Lidocaine 2% w/o epi Procedure type:    Complexity:  Complex Procedure details:    Ultrasound guidance: no     Needle aspiration: no     Incision types:  Cruciate   Incision depth:  Subcutaneous   Wound management:  Probed and deloculated   Drainage:  Purulent (Foul-smelling)   Drainage amount:  Moderate   Packing materials:  None Post-procedure details:    Procedure completion:  Tolerated well, no immediate complications     Medications Ordered in ED Medications  doxycycline (VIBRA-TABS) tablet 100 mg (has no administration in time range)  lidocaine-EPINEPHrine (PF) (XYLOCAINE-EPINEPHrine) 1 %-1:200000 (PF) injection 20 mL (20 mLs Infiltration Given by Other 09/08/22 0208)    ED Course/ Medical Decision Making/ A&P                                 Medical Decision Making Risk Prescription drug management.   Abscess of the proximal left thigh treated with incision and drainage.  I reviewed his past records, and he has ED visits on 03/30/2022, 10/31/2020, 06/17/2017 all for abscesses of the left thigh treated with incision and drainage each time.  I am discharging him with a prescription for doxycycline, I have ordered an initial dose prior to discharge.  Final  Clinical Impression(s) / ED Diagnoses Final diagnoses:  Abscess of left thigh  Chronic anticoagulation    Rx / DC Orders ED Discharge Orders          Ordered    doxycycline (VIBRAMYCIN) 100 MG capsule  2 times daily        09/08/22 0309              Dione Booze, MD 09/08/22 [phone]

## 2022-09-08 NOTE — ED Notes (Signed)
Wound to left inner thigh was cleaned with wound cleaner.

## 2022-11-15 ENCOUNTER — Telehealth: Payer: Self-pay | Admitting: Pulmonary Disease

## 2022-11-15 NOTE — Telephone Encounter (Signed)
Received a fax from [facility] requesting medical records back to 2020.  Returned fax to [facility] fax# [phone] and included a note providing information on how to submit the request to [facility] HIM Dept.

## 2023-06-20 IMAGING — CT CT ANGIO CHEST
2 of 10 series · 17 of 36 positions shown · IV contrast (Omnipaque)
Comparison: CT 08/05/2020

CLINICAL DATA: Left chest pain and shortness of breath with
increasing left leg pain, recent discharge due to DVT and PE

EXAM:
CT ANGIOGRAPHY CHEST WITH CONTRAST
TECHNIQUE: Multidetector CT imaging of the chest was performed using the
standard protocol during bolus administration of intravenous
contrast. Multiplanar CT image reconstructions and MIPs were
obtained to evaluate the vascular anatomy.
CONTRAST:  100mL OMNIPAQUE IOHEXOL 350 MG/ML SOLN

[Series 8: pe thins · axial · 0.96mm/px · z∈[+1187,+1454]mm · 16 of 301 slices shown]
[im 17/301  lung]
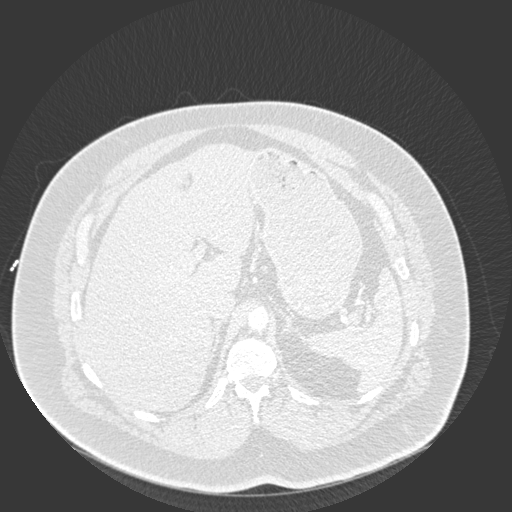
[im 34/301  mediastinal]
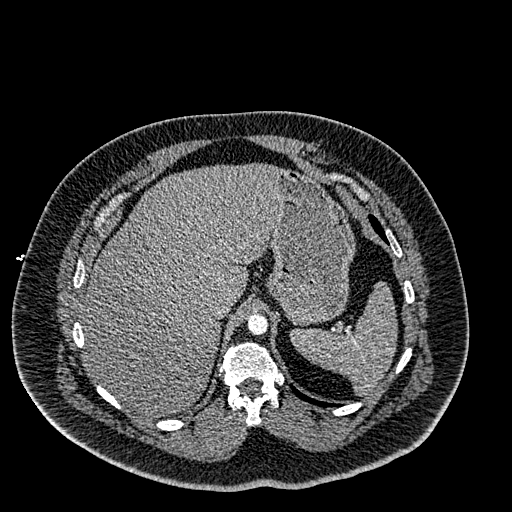
[im 51/301  lung]
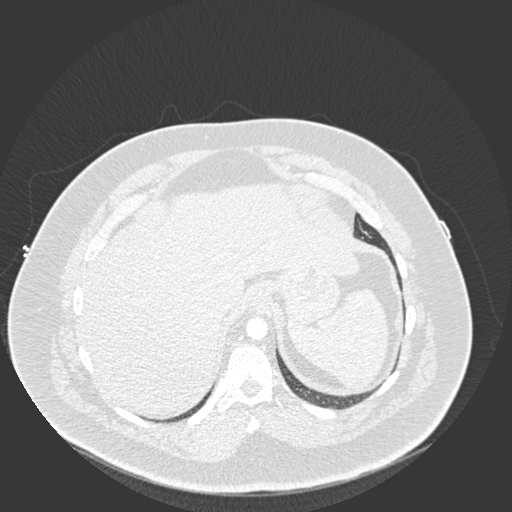
[im 67/301  mediastinal]
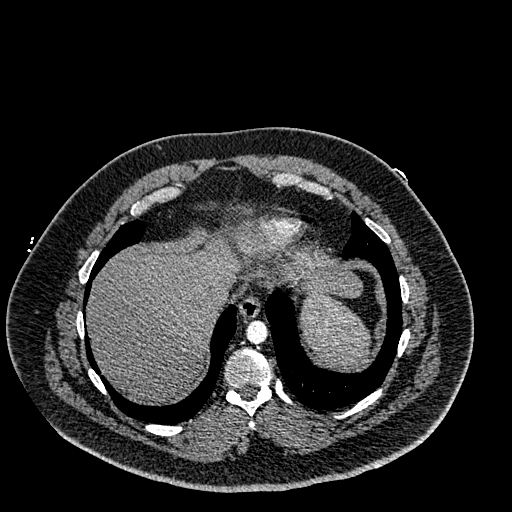
[im 84/301  lung]
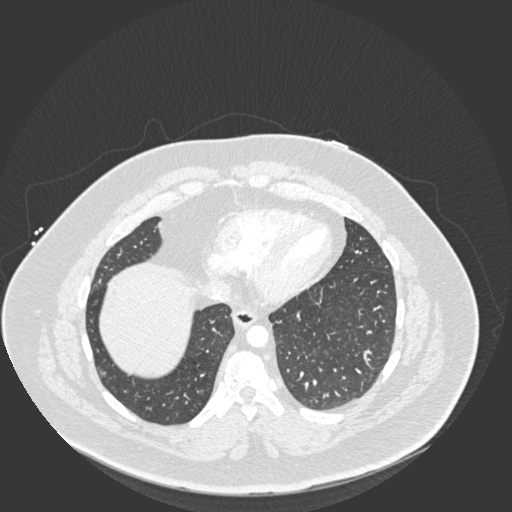
[im 101/301  mediastinal]
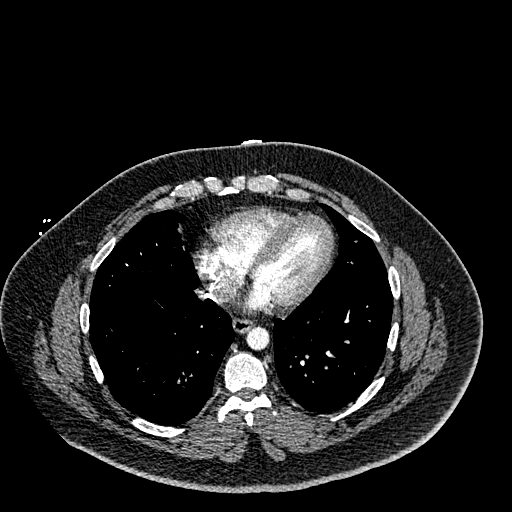
[im 117/301  lung]
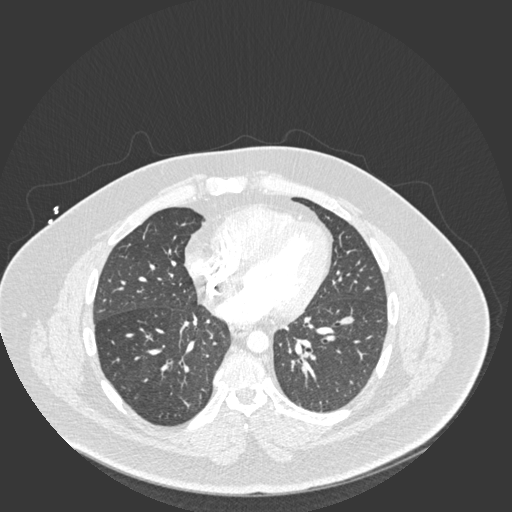
[im 134/301  mediastinal]
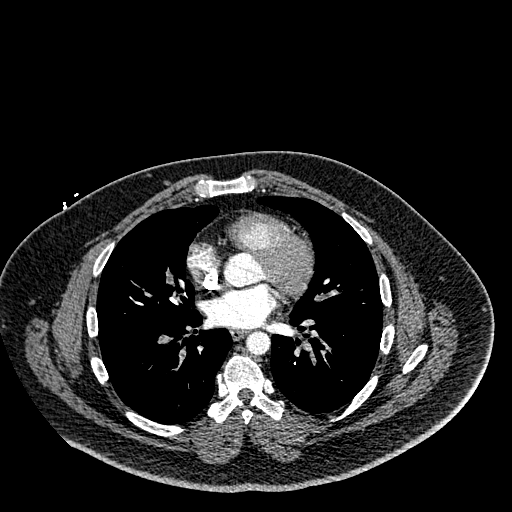
[im 167/301  lung]
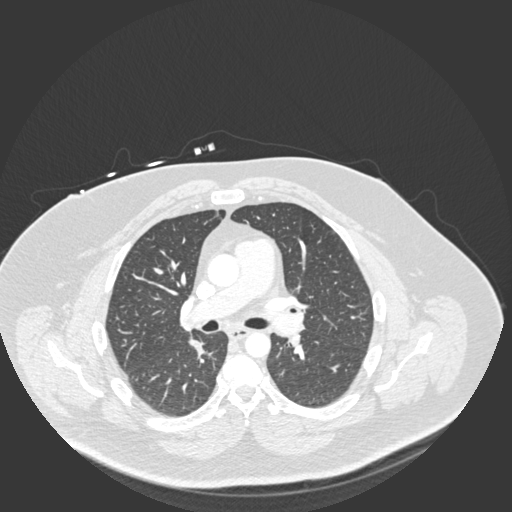
[im 184/301  mediastinal]
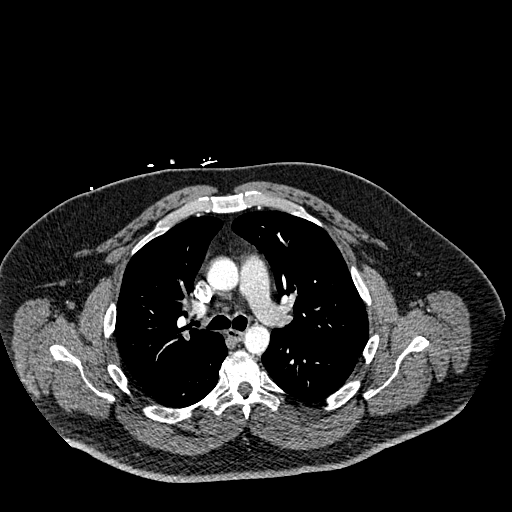
[im 201/301  lung]
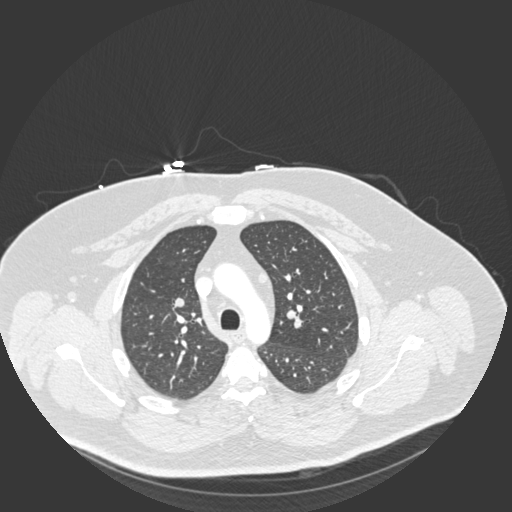
[im 217/301  mediastinal]
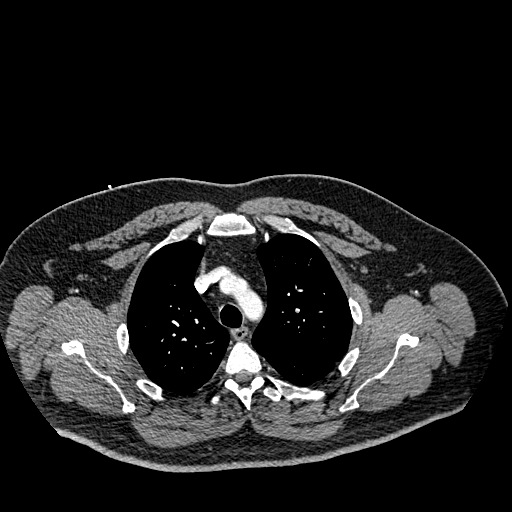
[im 234/301  lung]
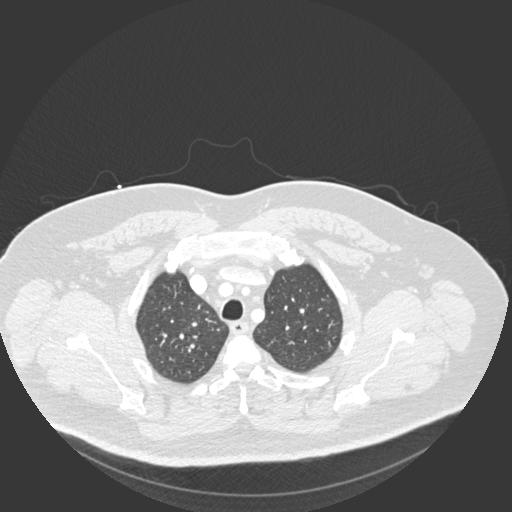
[im 251/301  mediastinal]
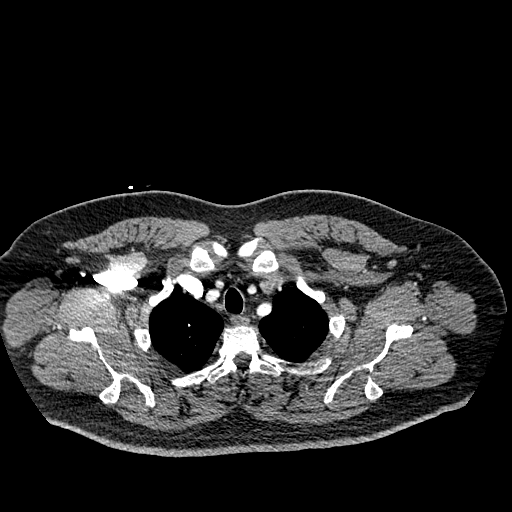
[im 267/301  lung]
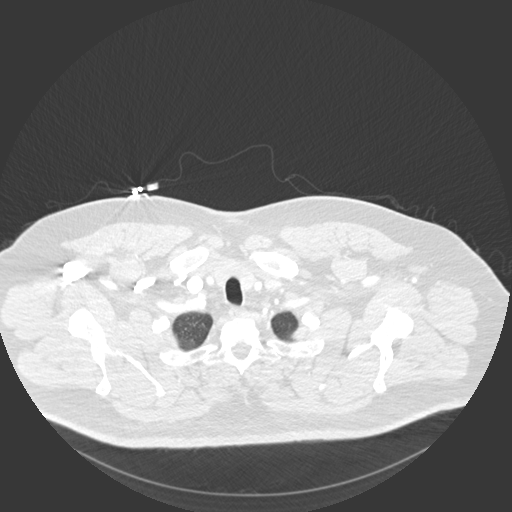
[im 284/301  mediastinal]
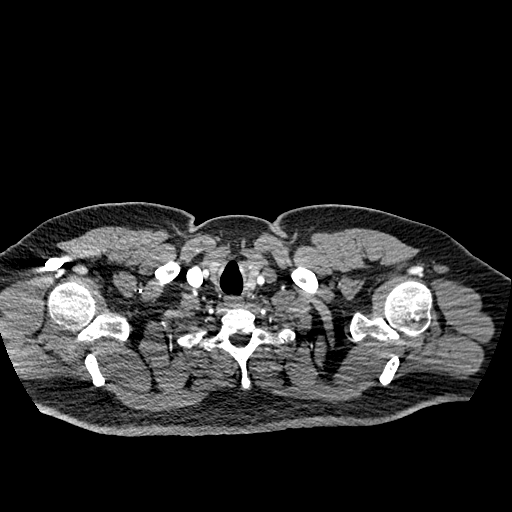

[Series 9: pe coronal mpr · coronal · 0.60mm/px · 1 of 147 slices shown]
[im 74/147  mediastinal]
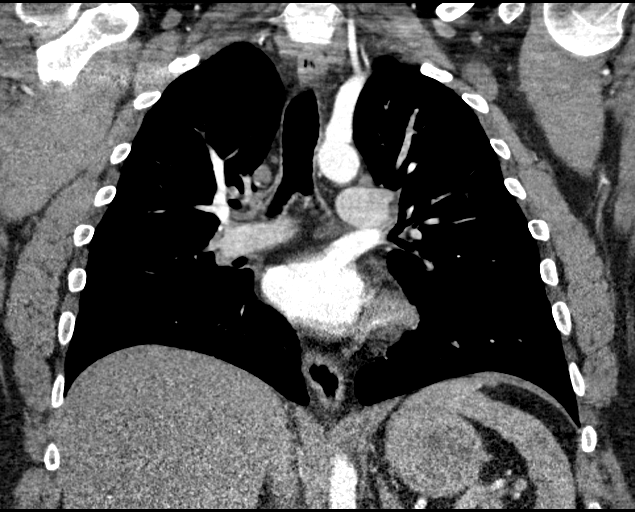

[17 of 36 positions shown; findings below may reference images not displayed]

FINDINGS: Cardiovascular: Suboptimal opacification of the pulmonary arteries
with a central pulmonary arterial contrast bolus density of 132
Hounsfield units. Redemonstration of hypoattenuating filling defects
extending from the distal right main pulmonary artery throughout the
lobar and segmental branches of the right upper, middle and lower
lobe, grossly stable from comparison prior. Additional smaller
segmental filling defects are seen in the left upper lobe, lingula
and lower lobe as well. No significant increase in overall clot
burden is evident at this time. RV/LV ratio of 0.84. Cardiac size
within normal limits. The aortic root is suboptimally assessed given
cardiac pulsation artifact. No acute luminal abnormality of the
imaged aorta. No periaortic stranding or hemorrhage. Normal 3 vessel
branching of the aortic arch. Proximal great vessels are
unremarkable.

Mediastinum/Nodes: No mediastinal fluid or gas. Normal thyroid gland
and thoracic inlet. No acute abnormality of the trachea or
esophagus. No worrisome mediastinal, hilar or axillary adenopathy.

Lungs/Pleura: Stable appearance of a 5 mm right lower lobe pulmonary
nodule (7/54). No new concerning pulmonary nodules or masses. No
consolidation, features of edema, pneumothorax, or effusion. Airways
are patent.

Upper Abdomen: No acute abnormalities present in the visualized
portions of the upper abdomen.

Musculoskeletal: No acute osseous abnormality or suspicious osseous
lesion. Multilevel degenerative changes are present in the imaged
portions of the spine.

Review of the MIP images confirms the above findings.
IMPRESSION: 1. Technically challenging exam with suboptimal contrast
opacification of the central pulmonary arteries. Regardless, there
is redemonstration of the bilateral pulmonary artery emboli
extending from the distal right main pulmonary artery throughout the
lobar and visible segmental branch of the right upper, middle and
lower lobe as well as within the lobar and segmental branches of the
left upper, lower lobes and lingula. No significant increase in
overall clot burden with a grossly stable appearance from comparison
prior.
2. No other acute intrathoracic process.
3. Stable appearance of a 5 mm right lower lobe pulmonary nodule.

These results were called by telephone at the time of interpretation
on 08/11/2020 at [DATE] to provider SORIN OXENDINE , who verbally
acknowledged these results.
